# Patient Record
Sex: Male | Born: 1945
Health system: Southern US, Community
[De-identification: ages and names within clinical notes are randomized; demographics above are authoritative.]

## PROBLEM LIST (undated history)

## (undated) DIAGNOSIS — K219 Gastro-esophageal reflux disease without esophagitis: Secondary | ICD-10-CM

## (undated) DIAGNOSIS — I10 Essential (primary) hypertension: Secondary | ICD-10-CM

## (undated) DIAGNOSIS — I639 Cerebral infarction, unspecified: Secondary | ICD-10-CM

## (undated) DIAGNOSIS — E669 Obesity, unspecified: Secondary | ICD-10-CM

## (undated) DIAGNOSIS — K2100 Gastro-esophageal reflux disease with esophagitis, without bleeding: Secondary | ICD-10-CM

## (undated) DIAGNOSIS — J189 Pneumonia, unspecified organism: Secondary | ICD-10-CM

## (undated) DIAGNOSIS — F4310 Post-traumatic stress disorder, unspecified: Secondary | ICD-10-CM

## (undated) DIAGNOSIS — E785 Hyperlipidemia, unspecified: Secondary | ICD-10-CM

## (undated) DIAGNOSIS — C61 Malignant neoplasm of prostate: Secondary | ICD-10-CM

## (undated) DIAGNOSIS — S0990XA Unspecified injury of head, initial encounter: Secondary | ICD-10-CM

## (undated) DIAGNOSIS — D649 Anemia, unspecified: Secondary | ICD-10-CM

## (undated) DIAGNOSIS — N5201 Erectile dysfunction due to arterial insufficiency: Secondary | ICD-10-CM

## (undated) DIAGNOSIS — J869 Pyothorax without fistula: Secondary | ICD-10-CM

## (undated) DIAGNOSIS — G4730 Sleep apnea, unspecified: Secondary | ICD-10-CM

## (undated) DIAGNOSIS — I519 Heart disease, unspecified: Secondary | ICD-10-CM

## (undated) HISTORY — DX: Pyothorax without fistula: J86.9

## (undated) HISTORY — DX: Obesity, unspecified: E66.9

## (undated) HISTORY — PX: TENDON REPAIR: SHX5111

## (undated) HISTORY — PX: VIDEO ASSISTED THORACOSCOPY: SHX5073

## (undated) HISTORY — DX: Cerebral infarction, unspecified: I63.9

## (undated) HISTORY — PX: CARDIAC DEFIBRILLATOR PLACEMENT: SHX171

## (undated) HISTORY — DX: Hyperlipidemia, unspecified: E78.5

## (undated) HISTORY — DX: Heart disease, unspecified: I51.9

## (undated) HISTORY — DX: Anemia, unspecified: D64.9

## (undated) HISTORY — PX: OTHER SURGICAL HISTORY: SHX169

## (undated) HISTORY — DX: Essential (primary) hypertension: I10

## (undated) HISTORY — DX: Post-traumatic stress disorder, unspecified: F43.10

## (undated) HISTORY — DX: Erectile dysfunction due to arterial insufficiency: N52.01

## (undated) HISTORY — DX: Pneumonia, unspecified organism: J18.9

## (undated) HISTORY — DX: Unspecified injury of head, initial encounter: S09.90XA

## (undated) HISTORY — DX: Sleep apnea, unspecified: G47.30

## (undated) HISTORY — DX: Gastro-esophageal reflux disease with esophagitis, without bleeding: K21.00

## (undated) HISTORY — PX: LUNG SURGERY: SHX703

## (undated) HISTORY — PX: ROTATOR CUFF REPAIR: SHX139

---

## 2003-07-23 ENCOUNTER — Ambulatory Visit (HOSPITAL_COMMUNITY): Admission: RE | Admit: 2003-07-23 | Discharge: 2003-07-23 | Payer: Self-pay | Admitting: Pulmonary Disease

## 2003-11-16 ENCOUNTER — Encounter: Payer: Self-pay | Admitting: Emergency Medicine

## 2003-11-16 ENCOUNTER — Inpatient Hospital Stay (HOSPITAL_COMMUNITY): Admission: EM | Admit: 2003-11-16 | Discharge: 2003-11-23 | Payer: Self-pay | Admitting: Cardiology

## 2004-01-22 ENCOUNTER — Ambulatory Visit (HOSPITAL_COMMUNITY): Admission: RE | Admit: 2004-01-22 | Discharge: 2004-01-22 | Payer: Self-pay | Admitting: Pulmonary Disease

## 2004-04-17 ENCOUNTER — Ambulatory Visit: Payer: Self-pay | Admitting: Cardiology

## 2005-03-11 ENCOUNTER — Ambulatory Visit: Payer: Self-pay | Admitting: Internal Medicine

## 2005-03-11 ENCOUNTER — Ambulatory Visit: Payer: Self-pay | Admitting: *Deleted

## 2005-04-21 ENCOUNTER — Ambulatory Visit: Payer: Self-pay | Admitting: *Deleted

## 2005-04-24 ENCOUNTER — Encounter (HOSPITAL_COMMUNITY): Admission: RE | Admit: 2005-04-24 | Discharge: 2005-04-24 | Payer: Self-pay | Admitting: *Deleted

## 2005-04-24 ENCOUNTER — Ambulatory Visit: Payer: Self-pay | Admitting: Cardiology

## 2005-06-10 ENCOUNTER — Ambulatory Visit: Payer: Self-pay | Admitting: Internal Medicine

## 2005-08-27 ENCOUNTER — Ambulatory Visit: Payer: Self-pay | Admitting: Cardiology

## 2005-12-22 ENCOUNTER — Ambulatory Visit: Payer: Self-pay | Admitting: Internal Medicine

## 2006-02-23 ENCOUNTER — Ambulatory Visit: Payer: Self-pay | Admitting: Internal Medicine

## 2006-03-19 ENCOUNTER — Ambulatory Visit: Payer: Self-pay

## 2006-05-06 ENCOUNTER — Ambulatory Visit (HOSPITAL_COMMUNITY): Admission: RE | Admit: 2006-05-06 | Discharge: 2006-05-06 | Payer: Self-pay | Admitting: Pulmonary Disease

## 2006-05-06 ENCOUNTER — Ambulatory Visit: Payer: Self-pay | Admitting: Orthopedic Surgery

## 2006-05-25 ENCOUNTER — Ambulatory Visit: Payer: Self-pay | Admitting: Internal Medicine

## 2006-05-25 ENCOUNTER — Ambulatory Visit: Payer: Self-pay | Admitting: Orthopedic Surgery

## 2006-05-25 ENCOUNTER — Ambulatory Visit (HOSPITAL_COMMUNITY): Admission: RE | Admit: 2006-05-25 | Discharge: 2006-05-25 | Payer: Self-pay | Admitting: Internal Medicine

## 2006-05-27 ENCOUNTER — Ambulatory Visit: Payer: Self-pay | Admitting: Orthopedic Surgery

## 2006-06-01 ENCOUNTER — Encounter (HOSPITAL_COMMUNITY): Admission: RE | Admit: 2006-06-01 | Discharge: 2006-07-01 | Payer: Self-pay | Admitting: Orthopedic Surgery

## 2006-06-07 ENCOUNTER — Ambulatory Visit: Payer: Self-pay | Admitting: Orthopedic Surgery

## 2006-07-06 ENCOUNTER — Ambulatory Visit: Payer: Self-pay | Admitting: Orthopedic Surgery

## 2006-08-02 ENCOUNTER — Ambulatory Visit: Payer: Self-pay | Admitting: Orthopedic Surgery

## 2010-09-10 ENCOUNTER — Other Ambulatory Visit (HOSPITAL_COMMUNITY): Payer: Self-pay | Admitting: Pulmonary Disease

## 2010-09-10 ENCOUNTER — Ambulatory Visit (HOSPITAL_COMMUNITY)
Admission: RE | Admit: 2010-09-10 | Discharge: 2010-09-10 | Disposition: A | Discharge status: Home / Self Care | Payer: Medicare Other | Source: Ambulatory Visit | Attending: Pulmonary Disease | Admitting: Pulmonary Disease

## 2010-09-10 DIAGNOSIS — J189 Pneumonia, unspecified organism: Secondary | ICD-10-CM

## 2010-09-12 ENCOUNTER — Inpatient Hospital Stay (HOSPITAL_COMMUNITY)
Admission: EM | Admit: 2010-09-12 | Discharge: 2010-09-18 | Disposition: A | Discharge status: Home / Self Care | Payer: Non-veteran care | Source: Home / Self Care | Attending: Pulmonary Disease | Admitting: Pulmonary Disease

## 2010-09-12 ENCOUNTER — Emergency Department (HOSPITAL_COMMUNITY): Payer: Non-veteran care

## 2010-09-12 DIAGNOSIS — F4310 Post-traumatic stress disorder, unspecified: Secondary | ICD-10-CM | POA: Diagnosis present

## 2010-09-12 DIAGNOSIS — Z23 Encounter for immunization: Secondary | ICD-10-CM

## 2010-09-12 DIAGNOSIS — D509 Iron deficiency anemia, unspecified: Secondary | ICD-10-CM | POA: Diagnosis present

## 2010-09-12 DIAGNOSIS — J90 Pleural effusion, not elsewhere classified: Secondary | ICD-10-CM | POA: Diagnosis present

## 2010-09-12 DIAGNOSIS — G4733 Obstructive sleep apnea (adult) (pediatric): Secondary | ICD-10-CM | POA: Diagnosis present

## 2010-09-12 DIAGNOSIS — Z95810 Presence of automatic (implantable) cardiac defibrillator: Secondary | ICD-10-CM

## 2010-09-12 DIAGNOSIS — J869 Pyothorax without fistula: Secondary | ICD-10-CM | POA: Diagnosis present

## 2010-09-12 DIAGNOSIS — N189 Chronic kidney disease, unspecified: Secondary | ICD-10-CM | POA: Diagnosis present

## 2010-09-12 DIAGNOSIS — I252 Old myocardial infarction: Secondary | ICD-10-CM

## 2010-09-12 DIAGNOSIS — I129 Hypertensive chronic kidney disease with stage 1 through stage 4 chronic kidney disease, or unspecified chronic kidney disease: Secondary | ICD-10-CM | POA: Diagnosis present

## 2010-09-12 DIAGNOSIS — I2510 Atherosclerotic heart disease of native coronary artery without angina pectoris: Secondary | ICD-10-CM | POA: Diagnosis present

## 2010-09-12 DIAGNOSIS — Z8673 Personal history of transient ischemic attack (TIA), and cerebral infarction without residual deficits: Secondary | ICD-10-CM

## 2010-09-12 DIAGNOSIS — Z8674 Personal history of sudden cardiac arrest: Secondary | ICD-10-CM

## 2010-09-12 DIAGNOSIS — Z87891 Personal history of nicotine dependence: Secondary | ICD-10-CM

## 2010-09-12 DIAGNOSIS — J189 Pneumonia, unspecified organism: Secondary | ICD-10-CM | POA: Diagnosis present

## 2010-09-12 HISTORY — DX: Essential (primary) hypertension: I10

## 2010-09-12 LAB — CBC
Hemoglobin: 9.5 g/dL — ABNORMAL LOW (ref 13.0–17.0)
MCH: 23.3 pg — ABNORMAL LOW (ref 26.0–34.0)
MCHC: 30.4 g/dL (ref 30.0–36.0)
Platelets: 390 10*3/uL (ref 150–400)
RDW: 16.5 % — ABNORMAL HIGH (ref 11.5–15.5)

## 2010-09-12 LAB — DIFFERENTIAL
Basophils Relative: 0 % (ref 0–1)
Eosinophils Absolute: 0.2 10*3/uL (ref 0.0–0.7)
Eosinophils Relative: 1 % (ref 0–5)
Monocytes Absolute: 0.7 10*3/uL (ref 0.1–1.0)
Monocytes Relative: 5 % (ref 3–12)

## 2010-09-12 LAB — BASIC METABOLIC PANEL
CO2: 27 mEq/L (ref 19–32)
Calcium: 8.3 mg/dL — ABNORMAL LOW (ref 8.4–10.5)
Creatinine, Ser: 2.2 mg/dL — ABNORMAL HIGH (ref 0.4–1.5)
GFR calc Af Amer: 37 mL/min — ABNORMAL LOW (ref >60)
GFR calc non Af Amer: 30 mL/min — ABNORMAL LOW (ref >60)

## 2010-09-12 LAB — POCT CARDIAC MARKERS: Myoglobin, poc: 443 ng/mL (ref 12–200)

## 2010-09-13 ENCOUNTER — Inpatient Hospital Stay (HOSPITAL_COMMUNITY): Payer: Non-veteran care

## 2010-09-13 ENCOUNTER — Encounter (HOSPITAL_COMMUNITY): Payer: Self-pay | Admitting: Radiology

## 2010-09-13 LAB — BASIC METABOLIC PANEL
CO2: 28 mEq/L (ref 19–32)
Chloride: 97 mEq/L (ref 96–112)
Creatinine, Ser: 2.28 mg/dL — ABNORMAL HIGH (ref 0.4–1.5)
GFR calc Af Amer: 35 mL/min — ABNORMAL LOW (ref >60)

## 2010-09-13 LAB — DIFFERENTIAL
Basophils Relative: 0 % (ref 0–1)
Lymphocytes Relative: 17 % (ref 12–46)
Monocytes Absolute: 0.9 10*3/uL (ref 0.1–1.0)
Monocytes Relative: 7 % (ref 3–12)
Neutro Abs: 10.5 10*3/uL — ABNORMAL HIGH (ref 1.7–7.7)

## 2010-09-13 LAB — CBC
HCT: 28.6 % — ABNORMAL LOW (ref 39.0–52.0)
Hemoglobin: 8.7 g/dL — ABNORMAL LOW (ref 13.0–17.0)
MCH: 23.5 pg — ABNORMAL LOW (ref 26.0–34.0)
MCHC: 30.4 g/dL (ref 30.0–36.0)

## 2010-09-13 MED ORDER — XENON XE 133 GAS
10.0000 | GAS_FOR_INHALATION | Freq: Once | RESPIRATORY_TRACT | Status: AC | PRN
  Administered 2010-09-13: 32.8 via RESPIRATORY_TRACT

## 2010-09-13 MED ORDER — TECHNETIUM TO 99M ALBUMIN AGGREGATED
5.0000 | Freq: Once | INTRAVENOUS | Status: AC | PRN
  Administered 2010-09-13: 5.12 via INTRAVENOUS

## 2010-09-14 LAB — CBC
HCT: 27.9 % — ABNORMAL LOW (ref 39.0–52.0)
Hemoglobin: 8.4 g/dL — ABNORMAL LOW (ref 13.0–17.0)
MCHC: 30.1 g/dL (ref 30.0–36.0)
WBC: 10.9 10*3/uL — ABNORMAL HIGH (ref 4.0–10.5)

## 2010-09-14 LAB — VITAMIN B12: Vitamin B-12: 616 pg/mL (ref 211–911)

## 2010-09-14 LAB — DIFFERENTIAL
Basophils Absolute: 0 10*3/uL (ref 0.0–0.1)
Basophils Relative: 0 % (ref 0–1)
Lymphocytes Relative: 16 % (ref 12–46)
Monocytes Absolute: 0.9 10*3/uL (ref 0.1–1.0)
Neutro Abs: 8.1 10*3/uL — ABNORMAL HIGH (ref 1.7–7.7)
Neutrophils Relative %: 74 % (ref 43–77)

## 2010-09-14 LAB — BASIC METABOLIC PANEL
CO2: 27 mEq/L (ref 19–32)
Calcium: 8 mg/dL — ABNORMAL LOW (ref 8.4–10.5)
GFR calc Af Amer: 48 mL/min — ABNORMAL LOW (ref >60)
GFR calc non Af Amer: 39 mL/min — ABNORMAL LOW (ref >60)
Glucose, Bld: 110 mg/dL — ABNORMAL HIGH (ref 70–99)
Potassium: 5 mEq/L (ref 3.5–5.1)
Sodium: 131 mEq/L — ABNORMAL LOW (ref 135–145)

## 2010-09-14 LAB — IRON AND TIBC: UIBC: 216 ug/dL

## 2010-09-14 NOTE — Group Therapy Note (Addendum)
  NAMEALDINE, GRAINGER                ACCOUNT NO.:  [id number]  MEDICAL RECORD NO.:  [id number]           PATIENT TYPE:  I  LOCATION:  A202                          FACILITY:  [facility]  PHYSICIAN:  Julia Alkhatib L. Juanetta Gosling, M.D.DATE OF BIRTH:  [birth year]  DATE OF PROCEDURE: DATE OF DISCHARGE:                                PROGRESS NOTE   Mr. Hunsinger was admitted early this morning with pneumonia.  He continues to have some significant right-sided chest pain and it is not clear what that is from.  His exam shows his temperature is 98.2, pulse 69, respirations 16, blood pressure 135/65 and O2 sats 93% on 2 L.  His chest is clearer.  His heart is regular.  His abdomen is soft.  He was noted to have renal failure and it is not clear if it is acute or not because he has been following at the [facility].  His BMET shows BUN is 30, creatinine 2.28.  Sodium 131.  His CBC shows white count is 13,900, hemoglobin is 8.7.  His D-dimer was up and I am going to go ahead and fully anticoagulate him while we are awaiting a ventilation perfusion lung scan.  I do not think that we can get a CT because of his renal function.     Shaunette Gassner L. Juanetta Gosling, M.D.     ELH/MEDQ  D:  09/13/2010  T:  09/14/2010  Job:  [id number]  Electronically Signed by Kari Baars M.D. on 09/14/2010 02:45:20 PM

## 2010-09-14 NOTE — H&P (Addendum)
NAMEESAUL, DORWART                ACCOUNT NO.:  [id number]  MEDICAL RECORD NO.:  [id number]           PATIENT TYPE:  I  LOCATION:  A202                          FACILITY:  [facility]  PHYSICIAN:  Maedell Hedger L. Juanetta Gosling, M.D.DATE OF BIRTH:  [birth year]  DATE OF ADMISSION:  09/12/2010 DATE OF DISCHARGE:  LH                             HISTORY & PHYSICAL   Mr. Cammarata is a 65 year old who had been in his usual state of fair health at home when he developed cough and some chest discomfort about 3- 4 days ago.  When I examined him in my office, he was found to have a temperature of about 102, and he was found to have pneumonia on chest x- ray.  He did fairly well, was apparently still febrile at home, then he developed right-sided chest pain, pneumonia is on the left, and came to the emergency room for evaluation.  He was noted to be in some mild distress because of his right-sided chest pain, had a chest x-ray done that showed that his pneumonia was actually a little bit worse than previously.  He was also found to have an elevated creatinine which is something unknown to him and he gets most of his care at the [facility], so I do not know if that is the situation or not.  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  He has hypertension.  He has posttraumatic stress disorder and he has had a torn biceps tendon.  SOCIAL HISTORY:  Married.  Lives at home.  He does not use alcohol or tobacco.  He has been working as a Associate Professor.  FAMILY HISTORY:  Shows that his mother died of lung cancer.  There is a family history of COPD and there is cardiac disease in the family as well.  REVIEW OF SYSTEMS:  Except as mentioned is essentially negative.  He says he feels better now.  PHYSICAL EXAMINATION:  GENERAL:  Shows a well-developed, well-nourished male who is in no acute distress now. HEENT:   His pupils are reactive.  Nose and throat are clear.  Mucous membranes are moist. NECK:  Supple without masses. CHEST:  Shows rhonchi bilaterally. HEART:  Regular without gallop. ABDOMEN:  Soft. EXTREMITIES:  Showed no edema. CNS:  Grossly intact.  CBC shows white count is 12,200, hemoglobin is 9.5, I am not sure if he has been anemic either.  Platelets 390.  He has 88% neutrophils.  BMET, sodium is low at 129, BUN of 27, creatinine 2.2, again I am unsure about the chronicity of this, but with an anemia, it may be chronic.  Chest x- ray shows pacemaker and some pulmonary congestion and worsening lingular pneumonia.  ASSESSMENT:  He has pneumonia which is worse.  He has some renal failure.  He is going to be admitted to the hospital for IV treatment and then decide what we need to do from there.  I will rehydrate him and see what happens with his renal function.     Brooklin Rieger L. Juanetta Gosling,  M.D.     ELH/MEDQ  D:  09/13/2010  T:  09/13/2010  Job:  [id number]  Electronically Signed by Kari Baars M.D. on 09/14/2010 02:45:16 PM

## 2010-09-14 NOTE — Group Therapy Note (Addendum)
  [name], [name]                ACCOUNT NO.:  [id number]  MEDICAL RECORD NO.:  [id number]           PATIENT TYPE:  I  LOCATION:  A202                          FACILITY:  [facility]  PHYSICIAN:  Shenique Childers L. Juanetta Gosling, M.D.DATE OF BIRTH:  [birth year]  DATE OF PROCEDURE: DATE OF DISCHARGE:                                PROGRESS NOTE   Luis Donovan was admitted with pneumonia.  He had right-sided chest pain. His pneumonia is on the left and he has some renal dysfunction, so I had him do a ventilation perfusion lung scan which does not show evidence of pulmonary embolus.  This morning, he is awake and alert, looks comfortable.  His family says he has been somewhat confused which I think may be related to morphine.  His temperature is 101.8 at 6 o'clock, pulse 91, respirations 20, blood pressure 152/69 and O2 sats 90% on 2 L.  His chest much clearer.  His heart is regular.  His abdomen is soft.  He looks more comfortable and his lab work this morning; his white count is 10,900, hemoglobin is 8.4, MCV on that is 77.3.  He has a anemia profile that has been drawn, but not back yet.  His sodium is 131 and slightly low.  BUN and creatinine are better at 23 and 1.76 respectively and his ventilation perfusion lung scan as mentioned did not show any significant problem.  Renal ultrasound shows that he has some atrophy in one of his kidneys which makes me wonder if he may have a area of renal artery stenosis considering his hypertension, etc., and this will need to be investigated probably once he is better from his pneumonia, I can investigate that as an outpatient.  Otherwise, we will plan to continue with his treatments.  I had put him on full-dose Lovenox until the ventilation perfusion lung scan was back.  I will put that back to the prophylactic dose, continue with everything else, continue with his meds, put him on something for nausea, recheck his hemoglobin level, put him on some Protonix for  complaints of heartburn.     Denai Caba L. Juanetta Gosling, M.D.     ELH/MEDQ  D:  09/14/2010  T:  09/14/2010  Job:  [id number]  Electronically Signed by Kari Baars M.D. on 09/14/2010 02:45:24 PM

## 2010-09-15 ENCOUNTER — Encounter (HOSPITAL_COMMUNITY): Payer: Self-pay | Admitting: Radiology

## 2010-09-15 ENCOUNTER — Inpatient Hospital Stay (HOSPITAL_COMMUNITY): Payer: Non-veteran care

## 2010-09-15 LAB — BASIC METABOLIC PANEL
BUN: 19 mg/dL (ref 6–23)
Calcium: 8.1 mg/dL — ABNORMAL LOW (ref 8.4–10.5)
Creatinine, Ser: 1.58 mg/dL — ABNORMAL HIGH (ref 0.4–1.5)
GFR calc non Af Amer: 45 mL/min — ABNORMAL LOW (ref >60)
Glucose, Bld: 112 mg/dL — ABNORMAL HIGH (ref 70–99)

## 2010-09-15 LAB — CBC
HCT: 27.2 % — ABNORMAL LOW (ref 39.0–52.0)
MCHC: 29.8 g/dL — ABNORMAL LOW (ref 30.0–36.0)
MCV: 77.5 fL — ABNORMAL LOW (ref 78.0–100.0)
Platelets: 423 10*3/uL — ABNORMAL HIGH (ref 150–400)
RDW: 16.9 % — ABNORMAL HIGH (ref 11.5–15.5)

## 2010-09-15 LAB — DIFFERENTIAL
Eosinophils Absolute: 0.2 10*3/uL (ref 0.0–0.7)
Eosinophils Relative: 2 % (ref 0–5)
Lymphocytes Relative: 14 % (ref 12–46)
Lymphs Abs: 2 10*3/uL (ref 0.7–4.0)
Monocytes Absolute: 1.1 10*3/uL — ABNORMAL HIGH (ref 0.1–1.0)

## 2010-09-16 ENCOUNTER — Inpatient Hospital Stay (HOSPITAL_COMMUNITY): Payer: Non-veteran care

## 2010-09-16 DIAGNOSIS — I517 Cardiomegaly: Secondary | ICD-10-CM

## 2010-09-16 LAB — BASIC METABOLIC PANEL
Calcium: 8.7 mg/dL (ref 8.4–10.5)
GFR calc Af Amer: 55 mL/min — ABNORMAL LOW (ref >60)
GFR calc non Af Amer: 45 mL/min — ABNORMAL LOW (ref >60)
Glucose, Bld: 117 mg/dL — ABNORMAL HIGH (ref 70–99)
Sodium: 130 mEq/L — ABNORMAL LOW (ref 135–145)

## 2010-09-16 LAB — CBC
HCT: 28.7 % — ABNORMAL LOW (ref 39.0–52.0)
MCHC: 30.3 g/dL (ref 30.0–36.0)
Platelets: 426 10*3/uL — ABNORMAL HIGH (ref 150–400)
RDW: 16.6 % — ABNORMAL HIGH (ref 11.5–15.5)

## 2010-09-16 LAB — DIFFERENTIAL
Basophils Absolute: 0 10*3/uL (ref 0.0–0.1)
Basophils Relative: 0 % (ref 0–1)
Eosinophils Absolute: 0.2 10*3/uL (ref 0.0–0.7)
Eosinophils Relative: 1 % (ref 0–5)
Monocytes Absolute: 1.4 10*3/uL — ABNORMAL HIGH (ref 0.1–1.0)

## 2010-09-17 ENCOUNTER — Inpatient Hospital Stay (HOSPITAL_COMMUNITY): Payer: Non-veteran care

## 2010-09-18 ENCOUNTER — Inpatient Hospital Stay (HOSPITAL_COMMUNITY): Payer: Non-veteran care

## 2010-09-18 ENCOUNTER — Inpatient Hospital Stay (HOSPITAL_COMMUNITY)
Admission: RE | Admit: 2010-09-18 | Discharge: 2010-09-25 | DRG: 163 | Disposition: A | Discharge status: Home / Self Care | Payer: Non-veteran care | Source: Other Acute Inpatient Hospital | Attending: Cardiothoracic Surgery | Admitting: Cardiothoracic Surgery

## 2010-09-18 DIAGNOSIS — J869 Pyothorax without fistula: Secondary | ICD-10-CM

## 2010-09-18 LAB — BASIC METABOLIC PANEL
BUN: 15 mg/dL (ref 6–23)
CO2: 33 mEq/L — ABNORMAL HIGH (ref 19–32)
Calcium: 9.6 mg/dL (ref 8.4–10.5)
Chloride: 94 mEq/L — ABNORMAL LOW (ref 96–112)
Creatinine, Ser: 1.22 mg/dL (ref 0.4–1.5)
GFR calc Af Amer: >60 mL/min (ref >60)
GFR calc non Af Amer: >60 mL/min (ref >60)
Glucose, Bld: 125 mg/dL — ABNORMAL HIGH (ref 70–99)
Potassium: 5.6 mEq/L — ABNORMAL HIGH (ref 3.5–5.1)
Sodium: 134 mEq/L — ABNORMAL LOW (ref 135–145)

## 2010-09-18 LAB — CBC
HCT: 30 % — ABNORMAL LOW (ref 39.0–52.0)
Hemoglobin: 9 g/dL — ABNORMAL LOW (ref 13.0–17.0)
MCH: 23 pg — ABNORMAL LOW (ref 26.0–34.0)
MCHC: 30 g/dL (ref 30.0–36.0)
MCV: 76.5 fL — ABNORMAL LOW (ref 78.0–100.0)
Platelets: 455 10*3/uL — ABNORMAL HIGH (ref 150–400)
RBC: 3.92 MIL/uL — ABNORMAL LOW (ref 4.22–5.81)
RDW: 16.7 % — ABNORMAL HIGH (ref 11.5–15.5)
WBC: 20 10*3/uL — ABNORMAL HIGH (ref 4.0–10.5)

## 2010-09-18 LAB — APTT: aPTT: 33 seconds (ref 24–37)

## 2010-09-18 LAB — URINE MICROSCOPIC-ADD ON

## 2010-09-18 LAB — URINALYSIS, ROUTINE W REFLEX MICROSCOPIC
Bilirubin Urine: NEGATIVE
Glucose, UA: NEGATIVE mg/dL
Ketones, ur: NEGATIVE mg/dL
Leukocytes, UA: NEGATIVE
Nitrite: NEGATIVE
Protein, ur: 30 mg/dL — AB
Specific Gravity, Urine: 1.02 (ref 1.005–1.030)
Urobilinogen, UA: 1 mg/dL (ref 0.0–1.0)
pH: 5.5 (ref 5.0–8.0)

## 2010-09-18 LAB — BLOOD GAS, ARTERIAL
Acid-Base Excess: 5 mmol/L — ABNORMAL HIGH (ref 0.0–2.0)
Bicarbonate: 29 mEq/L — ABNORMAL HIGH (ref 20.0–24.0)
Drawn by: 24513
FIO2: 21 %
O2 Saturation: 79.8 %
Patient temperature: 98.6
TCO2: 30.4 mmol/L (ref 0–100)
pCO2 arterial: 43 mmHg (ref 35.0–45.0)
pH, Arterial: 7.444 (ref 7.350–7.450)
pO2, Arterial: 45.3 mmHg — ABNORMAL LOW (ref 80.0–100.0)

## 2010-09-18 LAB — PROTIME-INR
INR: 1.17 (ref 0.00–1.49)
Prothrombin Time: 15.1 seconds (ref 11.6–15.2)

## 2010-09-18 LAB — ABO/RH: ABO/RH(D): A POS

## 2010-09-18 LAB — HEMOGLOBIN A1C
Hgb A1c MFr Bld: 6.2 % — ABNORMAL HIGH (ref <5.7)
Mean Plasma Glucose: 131 mg/dL — ABNORMAL HIGH (ref <117)

## 2010-09-18 LAB — MRSA PCR SCREENING: MRSA by PCR: NEGATIVE

## 2010-09-19 ENCOUNTER — Inpatient Hospital Stay (HOSPITAL_COMMUNITY): Payer: Non-veteran care

## 2010-09-19 ENCOUNTER — Other Ambulatory Visit: Payer: Self-pay | Admitting: Cardiothoracic Surgery

## 2010-09-19 DIAGNOSIS — J869 Pyothorax without fistula: Secondary | ICD-10-CM

## 2010-09-19 LAB — BLOOD GAS, ARTERIAL
Acid-Base Excess: 3.4 mmol/L — ABNORMAL HIGH (ref 0.0–2.0)
Acid-Base Excess: 3.7 mmol/L — ABNORMAL HIGH (ref 0.0–2.0)
Bicarbonate: 28.9 mEq/L — ABNORMAL HIGH (ref 20.0–24.0)
Bicarbonate: 28.6 mEq/L — ABNORMAL HIGH (ref 20.0–24.0)
Drawn by: 22251
Drawn by: 22251
Expiratory PAP: 6
Expiratory PAP: 6
FIO2: 0.5 %
FIO2: 0.5 %
Inspiratory PAP: 6
Inspiratory PAP: 12
Mode: POSITIVE
Mode: POSITIVE
O2 Saturation: 91.3 %
O2 Saturation: 93.3 %
Patient temperature: 98.6
Patient temperature: 98.6
TCO2: 30.6 mmol/L (ref 0–100)
TCO2: 30.2 mmol/L (ref 0–100)
pCO2 arterial: 56.3 mmHg — ABNORMAL HIGH (ref 35.0–45.0)
pCO2 arterial: 51 mmHg — ABNORMAL HIGH (ref 35.0–45.0)
pH, Arterial: 7.33 — ABNORMAL LOW (ref 7.350–7.450)
pH, Arterial: 7.368 (ref 7.350–7.450)
pO2, Arterial: 71.2 mmHg — ABNORMAL LOW (ref 80.0–100.0)
pO2, Arterial: 74.1 mmHg — ABNORMAL LOW (ref 80.0–100.0)

## 2010-09-19 LAB — CBC
HCT: 29.9 % — ABNORMAL LOW (ref 39.0–52.0)
Hemoglobin: 9 g/dL — ABNORMAL LOW (ref 13.0–17.0)
MCH: 22.7 pg — ABNORMAL LOW (ref 26.0–34.0)
MCHC: 30.1 g/dL (ref 30.0–36.0)
MCV: 75.5 fL — ABNORMAL LOW (ref 78.0–100.0)
Platelets: 456 10*3/uL — ABNORMAL HIGH (ref 150–400)
RBC: 3.96 MIL/uL — ABNORMAL LOW (ref 4.22–5.81)
RDW: 16.6 % — ABNORMAL HIGH (ref 11.5–15.5)
WBC: 29.6 10*3/uL — ABNORMAL HIGH (ref 4.0–10.5)

## 2010-09-19 LAB — GLUCOSE, CAPILLARY
Glucose-Capillary: 113 mg/dL — ABNORMAL HIGH (ref 70–99)
Glucose-Capillary: 116 mg/dL — ABNORMAL HIGH (ref 70–99)

## 2010-09-19 NOTE — Consult Note (Signed)
Luis Donovan, Luis Donovan                ACCOUNT NO.:  [id number]  MEDICAL RECORD NO.:  [id number]           PATIENT TYPE:  I  LOCATION:  2032                         FACILITY:  [facility]  PHYSICIAN:  Kerin Perna, M.D.  DATE OF BIRTH:  1946/02/12  DATE OF CONSULTATION:  09/18/2010 DATE OF DISCHARGE:                                CONSULTATION   REASON FOR CONSULTATION: 1. Right lower lobe pneumonia and right empyema. 2. Bibasilar lung densities.  HISTORY OF PRESENT ILLNESS:  I was asked to evaluate this 65 year old obese African American, nondiabetic, nonsmoking male for surgical therapy for recently diagnosed right empyema.  The patient was admitted to [facility] on September 13, 2010, with a preceding history of several days of nonproductive cough, low-grade fever, malaise, and a chest x-ray showing infiltrate in the right base.  He was placed on oral antibiotics, but within 48 hours developed right back pain and was admitted to the hospital.  A CT scan at that time showed a moderate pleural effusion, which was new and attempted thoracentesis was unsuccessful in removing any fluid.  He is placed on IV antibiotics (Zithromax and Rocephin) and had no clinical improvement.  For that reason, he was transferred to [facility] for thoracic surgical evaluation and treatment of a right basilar pneumonia with a complication of right empyema.  The CT scan demonstrated also bibasilar densities, which could be due to atelectasis.  There are no highly suspicious mediastinal nodes noted.  The patient has had mild weight loss, but was actually feeling fairly well 1-2 weeks prior to this illness.  PAST MEDICAL HISTORY: 1. Hypertension. 2. Chronic renal insufficiency (creatinine was 2.2 on admission; now     it is 1.56). 3. 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 4. No known drug allergies. 5.  Obstructive sleep apnea. 6. Obesity. 7. 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 8. Chronic anemia of unknown etiology. 9. Posttraumatic stress disorder.  SOCIAL HISTORY:  The patient is married with adult children.  He works as a Archivist.  He denies alcohol or tobacco use.  He also makes candles.  FAMILY HISTORY:  Positive for lung cancer in his mother, positive for COPD, and positive for coronary artery disease.  HOME MEDICATIONS: 1. Norvasc 10 mg daily. 2. Toprol-XL 50 mg daily. 3. Protonix 40 mg daily. 4. Crestor 20 mg daily.  ALLERGIES:  None.  REVIEW OF SYSTEMS:  CONSTITUTIONAL:  Review is positive fever and perhaps mild weight loss recently.  No night sweats.  ENT:  Review is positive for previous visual loss from a remote CVA which is mild.  He denies any active dental complaints or difficulty swallowing.  THORACIC: Review is negative for history of previous thoracic trauma, previous pneumonia, pneumothorax, or hemoptysis.  CARDIAC:  Review is positive for his known coronary disease, status post MI, VFib arrest in the AICU was implanted in 2005.  His EF by echo on May 1 was 50%.  He had no significant valve disease and he had no significant pericardial effusion.  GI:  Review is negative for hepatitis, jaundice, or blood per rectum.  UROLOGIC:  Review is negative for BPH, kidney stones, or hematuria.  ENDOCRINE:  Review is negative diabetes or thyroid disease. VASCULAR:  Review is negative for DVT, claudication, or TIA. NEUROLOGIC:  Review is positive for stroke.  Negative for seizure.  He is right-hand dominant.  PHYSICAL EXAMINATION:  VITAL SIGNS:  The patient is 5 feet 8 inches, and weighs 117 kg, blood pressure is 180/90, pulse 100, temperature 97.8, saturation 94% on 4 L nasal cannula. GENERAL APPEARANCE:  That of a middle-aged male in no acute distress, surrounded by wife and family. HEENT:  Normocephalic.  Pupils are  equal. NECK:  Without JVD, mass or palpable adenopathy. LUNGS:  Breath sounds are with bilateral wheezing.  Breath sounds are diminished at the right base.  He has tenderness over his right posterior flank and two puncture sites from an attempted thoracentesis x2 which were unsuccessful to remove any fluid. CARDIAC:  Regular rhythm.  Tachycardic.  Without S3 gallop or murmur. ABDOMEN:  Obese, soft, and nontender. EXTREMITIES:  No clubbing, cyanosis, tenderness, or edema.  Peripheral pulses are intact in the extremities. NEUROLOGIC:  Alert and oriented without focal motor deficits.  LABORATORY DATA:  I have reviewed his CT scan and his laboratory values. He is anemic with hematocrit 28.  His creatinine is 2.2 and his white count is 14,000.  IMPRESSION AND PLAN:  The patient has evidence of right basilar pneumonia complicated by right empyema with clinical improvement on IV antibiotics.  Right video-assisted thoracic surgery and decortication would be the recommendation; and we will obtain material for culture as well as cytology and pathology.  He understands the indications, benefits, alternatives, and risks of right video-assisted thoracic surgery decortication.  The risks include potential stroke, further renal failure, ventilator-dependence, bleeding, blood transfusion requirement, myocardial infarction, and death.  He agrees to proceed with surgery in the morning.     Kerin Perna, M.D.     PV/MEDQ  D:  09/18/2010  T:  09/19/2010  Job:  [id number]  cc:   Ramon Dredge L. Juanetta Gosling, M.D.  Electronically Signed by Kerin Perna M.D. on 09/19/2010 03:16:29 PM

## 2010-09-20 ENCOUNTER — Inpatient Hospital Stay (HOSPITAL_COMMUNITY): Payer: Non-veteran care

## 2010-09-20 DIAGNOSIS — E1165 Type 2 diabetes mellitus with hyperglycemia: Secondary | ICD-10-CM

## 2010-09-20 DIAGNOSIS — IMO0001 Reserved for inherently not codable concepts without codable children: Secondary | ICD-10-CM

## 2010-09-20 LAB — BLOOD GAS, ARTERIAL
Acid-Base Excess: 5.6 mmol/L — ABNORMAL HIGH (ref 0.0–2.0)
Bicarbonate: 30.1 mEq/L — ABNORMAL HIGH (ref 20.0–24.0)
FIO2: 0.3 %
O2 Saturation: 96.6 %
Patient temperature: 98.6
TCO2: 31.6 mmol/L (ref 0–100)
pCO2 arterial: 48.2 mmHg — ABNORMAL HIGH (ref 35.0–45.0)
pH, Arterial: 7.412 (ref 7.350–7.450)
pO2, Arterial: 84 mmHg (ref 80.0–100.0)

## 2010-09-20 LAB — CBC
HCT: 27.4 % — ABNORMAL LOW (ref 39.0–52.0)
Hemoglobin: 8.3 g/dL — ABNORMAL LOW (ref 13.0–17.0)
MCH: 22.9 pg — ABNORMAL LOW (ref 26.0–34.0)
MCHC: 30.3 g/dL (ref 30.0–36.0)
MCV: 75.5 fL — ABNORMAL LOW (ref 78.0–100.0)
Platelets: 344 10*3/uL (ref 150–400)
RBC: 3.63 MIL/uL — ABNORMAL LOW (ref 4.22–5.81)
RDW: 16.4 % — ABNORMAL HIGH (ref 11.5–15.5)
WBC: 23.4 10*3/uL — ABNORMAL HIGH (ref 4.0–10.5)

## 2010-09-20 LAB — GLUCOSE, CAPILLARY
Glucose-Capillary: 141 mg/dL — ABNORMAL HIGH (ref 70–99)
Glucose-Capillary: 93 mg/dL (ref 70–99)
Glucose-Capillary: 119 mg/dL — ABNORMAL HIGH (ref 70–99)
Glucose-Capillary: 90 mg/dL (ref 70–99)

## 2010-09-20 LAB — BASIC METABOLIC PANEL
BUN: 15 mg/dL (ref 6–23)
CO2: 33 mEq/L — ABNORMAL HIGH (ref 19–32)
Calcium: 8.7 mg/dL (ref 8.4–10.5)
Chloride: 94 mEq/L — ABNORMAL LOW (ref 96–112)
Creatinine, Ser: 1.19 mg/dL (ref 0.4–1.5)
GFR calc Af Amer: >60 mL/min (ref >60)
GFR calc non Af Amer: >60 mL/min (ref >60)
Glucose, Bld: 110 mg/dL — ABNORMAL HIGH (ref 70–99)
Potassium: 4.8 mEq/L (ref 3.5–5.1)
Sodium: 133 mEq/L — ABNORMAL LOW (ref 135–145)

## 2010-09-21 ENCOUNTER — Inpatient Hospital Stay (HOSPITAL_COMMUNITY): Payer: Non-veteran care

## 2010-09-21 LAB — CROSSMATCH
ABO/RH(D): A POS
Antibody Screen: NEGATIVE
Unit division: 0
Unit division: 0

## 2010-09-21 LAB — CBC
HCT: 25.6 % — ABNORMAL LOW (ref 39.0–52.0)
Hemoglobin: 7.9 g/dL — ABNORMAL LOW (ref 13.0–17.0)
MCH: 23.4 pg — ABNORMAL LOW (ref 26.0–34.0)
MCHC: 30.9 g/dL (ref 30.0–36.0)
MCV: 75.7 fL — ABNORMAL LOW (ref 78.0–100.0)
Platelets: 329 10*3/uL (ref 150–400)
RBC: 3.38 MIL/uL — ABNORMAL LOW (ref 4.22–5.81)
RDW: 16.7 % — ABNORMAL HIGH (ref 11.5–15.5)
WBC: 20.2 10*3/uL — ABNORMAL HIGH (ref 4.0–10.5)

## 2010-09-21 LAB — COMPREHENSIVE METABOLIC PANEL
ALT: 30 U/L (ref 0–53)
AST: 31 U/L (ref 0–37)
Albumin: 2.1 g/dL — ABNORMAL LOW (ref 3.5–5.2)
Alkaline Phosphatase: 91 U/L (ref 39–117)
BUN: 16 mg/dL (ref 6–23)
CO2: 33 mEq/L — ABNORMAL HIGH (ref 19–32)
Calcium: 8.6 mg/dL (ref 8.4–10.5)
Chloride: 92 mEq/L — ABNORMAL LOW (ref 96–112)
Creatinine, Ser: 1.4 mg/dL (ref 0.4–1.5)
GFR calc Af Amer: >60 mL/min (ref >60)
GFR calc non Af Amer: 51 mL/min — ABNORMAL LOW (ref >60)
Glucose, Bld: 102 mg/dL — ABNORMAL HIGH (ref 70–99)
Potassium: 4.1 mEq/L (ref 3.5–5.1)
Sodium: 131 mEq/L — ABNORMAL LOW (ref 135–145)
Total Bilirubin: 0.2 mg/dL — ABNORMAL LOW (ref 0.3–1.2)
Total Protein: 5.7 g/dL — ABNORMAL LOW (ref 6.0–8.3)

## 2010-09-21 LAB — GLUCOSE, CAPILLARY
Glucose-Capillary: 124 mg/dL — ABNORMAL HIGH (ref 70–99)
Glucose-Capillary: 148 mg/dL — ABNORMAL HIGH (ref 70–99)

## 2010-09-21 LAB — POCT I-STAT 3, ART BLOOD GAS (G3+)
Bicarbonate: 26.7 mEq/L — ABNORMAL HIGH (ref 20.0–24.0)
O2 Saturation: 89 %
TCO2: 28 mmol/L (ref 0–100)
pCO2 arterial: 51.7 mmHg — ABNORMAL HIGH (ref 35.0–45.0)
pH, Arterial: 7.321 — ABNORMAL LOW (ref 7.350–7.450)
pO2, Arterial: 62 mmHg — ABNORMAL LOW (ref 80.0–100.0)

## 2010-09-21 LAB — VANCOMYCIN, TROUGH: Vancomycin Tr: 20.7 ug/mL — ABNORMAL HIGH (ref 10.0–20.0)

## 2010-09-22 ENCOUNTER — Inpatient Hospital Stay (HOSPITAL_COMMUNITY): Payer: Non-veteran care

## 2010-09-22 LAB — CBC
HCT: 25.9 % — ABNORMAL LOW (ref 39.0–52.0)
Hemoglobin: 7.8 g/dL — ABNORMAL LOW (ref 13.0–17.0)
MCH: 22.6 pg — ABNORMAL LOW (ref 26.0–34.0)
MCHC: 30.1 g/dL (ref 30.0–36.0)
MCV: 75.1 fL — ABNORMAL LOW (ref 78.0–100.0)
Platelets: 338 10*3/uL (ref 150–400)
RBC: 3.45 MIL/uL — ABNORMAL LOW (ref 4.22–5.81)
RDW: 16.7 % — ABNORMAL HIGH (ref 11.5–15.5)
WBC: 15.9 10*3/uL — ABNORMAL HIGH (ref 4.0–10.5)

## 2010-09-22 LAB — BODY FLUID CULTURE
Culture: NO GROWTH
Culture: NO GROWTH
Gram Stain: NONE SEEN

## 2010-09-22 LAB — BASIC METABOLIC PANEL
BUN: 14 mg/dL (ref 6–23)
CO2: 32 mEq/L (ref 19–32)
Calcium: 8.9 mg/dL (ref 8.4–10.5)
Chloride: 95 mEq/L — ABNORMAL LOW (ref 96–112)
Creatinine, Ser: 1.34 mg/dL (ref 0.4–1.5)
GFR calc Af Amer: >60 mL/min (ref >60)
GFR calc non Af Amer: 54 mL/min — ABNORMAL LOW (ref >60)
Glucose, Bld: 127 mg/dL — ABNORMAL HIGH (ref 70–99)
Potassium: 4 mEq/L (ref 3.5–5.1)
Sodium: 135 mEq/L (ref 135–145)

## 2010-09-22 LAB — GLUCOSE, CAPILLARY: Glucose-Capillary: 114 mg/dL — ABNORMAL HIGH (ref 70–99)

## 2010-09-23 ENCOUNTER — Inpatient Hospital Stay (HOSPITAL_COMMUNITY): Payer: Non-veteran care

## 2010-09-24 ENCOUNTER — Inpatient Hospital Stay (HOSPITAL_COMMUNITY): Payer: Non-veteran care

## 2010-09-24 LAB — ANAEROBIC CULTURE: Gram Stain: NONE SEEN

## 2010-09-24 NOTE — Group Therapy Note (Addendum)
  NAMEJOAKIM, Luis Donovan                ACCOUNT NO.:  [id number]  MEDICAL RECORD NO.:  [id number]           PATIENT TYPE:  LOCATION:                                 FACILITY:  PHYSICIAN:  Dalyn Becker L. Juanetta Gosling, M.D.DATE OF BIRTH:  07-26-1945  DATE OF PROCEDURE: DATE OF DISCHARGE:                                PROGRESS NOTE   [name] continues to have complaints of severe right-sided pain.  His pneumonia is on the left.  He also has very low iron levels and is anemic which he did not know about.  His renal function has improved some.  He says he is more short of breath.  EXAMINATION:  VITAL SIGNS:  His temperature is 98.4; pulse 94; respirations 16; blood pressure 164/85; O2 sats 96% on 2 liters, then later 93% on 4 liters. CHEST:  Decreased breath sounds.  He is splinting the right hemidiaphragm.  His CBC shows his hemoglobin is 8.1, compared to 8.4 yesterday.  BMET shows his sodium is 129, potassium 5.3, BUN of 19, creatinine is down to 1.58, I think all that is better.  His potassium is up slightly.  ASSESSMENT:  He is anemic, that may be causing multifactorial shortness of breath.  His iron level is very low, I am going to check stools for blood.  I am going to go ahead and have him get a CT of the chest without contrast because it is just not clear why he is having so much pain in his right side.  I have encouraged him to use the incentive spirometer.  I would typically add some anti-inflammatory but with his anemia, especially iron deficiency, I am concerned that this may be related to perhaps some GI bleeding and wait and see what the results of all of the above are.     Tena Linebaugh L. Juanetta Gosling, M.D.     ELH/MEDQ  D:  09/15/2010  T:  09/15/2010  Job:  [id number]  Electronically Signed by Kari Baars M.D. on 09/24/2010 12:43:40 PM

## 2010-09-24 NOTE — Group Therapy Note (Addendum)
  NAMEKAENAN, JAKE                ACCOUNT NO.:  [id number]  MEDICAL RECORD NO.:  [id number]           PATIENT TYPE:  I  LOCATION:                                 FACILITY:  PHYSICIAN:  Lynard Postlewait L. Juanetta Gosling, M.D.DATE OF BIRTH:  12/19/1945  DATE OF PROCEDURE:  09/18/2010 DATE OF DISCHARGE:                                PROGRESS NOTE   Mr. Varady is a 65 year old African American male who was seen in my office about a week prior to admission and was found to have a left lower lobe pneumonia.  He was complaining of right-sided chest pain.  He had been sick for several days.  He was treated with Avelox, but did not seem to improve a great deal, continued to have fever, then developed more right-sided chest pain, came to the emergency room, was found to have worsened pneumonia, fever, and was admitted.  He has had workup here in the hospital.  He was found to have some worsening of his mild chronic renal failure and he was treated for pneumonia.  His antibiotics were changed but he continued to have problems.  He underwent a CT without contrast that showed that he had a right lung effusion.  I attempted to get a thoracentesis but fluid was not able to be obtained because it appeared to be loculated and it looked like a complicated effusion on sonogram.  In addition to all that, he was found to be anemic with which appears to be iron deficiency.  So far, his stools are negative.  He had a renal ultrasound that shows that he has some decrease in size in one of his kidneys.  After the attempted thoracentesis, I discussed the situation with Dr. Donata Clay who agrees to accept him in transfer for probable VATS procedure.  After he is improved and discharged, he is going to need workup of his anemia and workup of the change in his kidney size, but all that can be accomplished as an outpatient.  He is being transferred to [facility] to the service of Dr. Donata Clay for presumed VATS  procedure.     Mossie Gilder L. Juanetta Gosling, M.D.     ELH/MEDQ  D:  09/18/2010  T:  09/18/2010  Job:  [id number]  Electronically Signed by Kari Baars M.D. on 09/24/2010 12:43:47 PM

## 2010-09-24 NOTE — Group Therapy Note (Addendum)
  NAMEEDIEL, UNANGST                ACCOUNT NO.:  [id number]  MEDICAL RECORD NO.:  [id number]           PATIENT TYPE:  LOCATION:                                 FACILITY:  PHYSICIAN:  Rashida Ladouceur L. Juanetta Gosling, M.D.DATE OF BIRTH:  06-26-1945  DATE OF PROCEDURE: DATE OF DISCHARGE:                                PROGRESS NOTE   Mr. Brouillard says he is feeling better.  He is admitted with pneumonia, also found to have anemia, increased renal dysfunction.  He had a CT of the chest yesterday that showed he has a right pleural effusion and some right lung infiltrate.  This morning, he says he feels better.  He is less short of breath having less pain.  His temperature is 100, pulse 108, respirations 24, blood pressure 162/82, O2 sats 92% on 3 liters. His chest is much clear and he is moving air better.  His heart is regular.  His abdomen is soft.  Extremities showed no edema.  ASSESSMENT:  He is overall improved.  PLAN:  He is going to have a thoracentesis today.  I have ordered CBC and BMET today.  Continue with his antibiotics.  I am going to give him another dose of Lasix and then follow from there.     Dermot Gremillion L. Juanetta Gosling, M.D.     ELH/MEDQ  D:  09/16/2010  T:  09/16/2010  Job:  [id number]  Electronically Signed by Kari Baars M.D. on 09/24/2010 12:43:44 PM

## 2010-09-29 NOTE — Op Note (Signed)
Luis Donovan, Luis Donovan                ACCOUNT NO.:  [id number]  MEDICAL RECORD NO.:  [id number]           PATIENT TYPE:  I  LOCATION:  2313                         FACILITY:  [facility]  PHYSICIAN:  Kerin Perna, M.D.  DATE OF BIRTH:  Oct 31, 1945  DATE OF PROCEDURE:  09/19/2010 DATE OF DISCHARGE:                              OPERATIVE REPORT   OPERATIONS: 1. Right VATS (video-assisted thoracoscopic surgery) with     minithoracotomy and decortication of empyema involving the right     lower lobe. 2. Placement of wound On-Q analgesic irrigation system.  SURGEON:  Kerin Perna, MD  ASSISTANT:  Coral Ceo, PA-C  PREOPERATIVE DIAGNOSIS:  Right lower lobe pneumonia with empyema.  POSTOPERATIVE DIAGNOSIS:  Right lower lobe pneumonia with empyema.  INDICATIONS:  The patient is a 65 year old obese, nondiabetic, reformed smoker who was treated with antibiotics for a right lower lobe pneumonia, but progressed to having chest pain and a significant pleural effusion, which was loculated and not drained with thoracentesis.  He was transferred from [facility] to [facility] for thoracic surgical evaluation, treatment of a right empyema which was documented by chest CT scan.  I discussed the procedure of right VATS decortication for treatment of the empyema with the patient and his family and they understood and agreed.  They understood the issues with postoperative pain, the potential for postoperative respiratory problems including the ventilator to support, bleeding, blood transfusion requirement, further pneumonia, and death.  He demonstrated his understanding of these issues and agreed to proceed with surgery under what I felt was an informed consent.  PROCEDURE:  The patient was brought to the operating room, placed supine on the operating room table where general anesthesia was induced.  A double-lumen endotracheal tube was positioned by the Anesthesiology Team and the patient  was turned to expose the right chest.  A proper time-out was performed to confirm proper patient, proper site and proper procedure.  The right chest was then prepped and draped as a sterile field.  A small VATS portal incision was made in the fifth interspace anterior to the tip of the scapula.  The scope was inserted, but there was significant fluid in adhesions and poor visualization.  The incision was then extended to approximately 6-8 cm and the ribs gently retracted. There was an attempts of pleural reaction with loculated fluid, glue- like material as well as a thick fibrous peel on the diaphragm and covering the right lower lobe and extending to the posterior segment of the right upper lobe.  This was carefully dissected and the material on the visceral pleura removed as well as the material on the diaphragm. There was no evidence of tumor, but the material was sent to pathology and the fluid was sent for cytology.  Also, cultures were submitted of fluid and tissue.  The lung was entirely freed of all loculated fluid. The right lung was completely freed of the entrapped visceral peel.  The lungs were irrigated with warm saline.  Two mediastinal chest tubes were placed.  The anterior and posterior tubes were placed to drain the anterior and posterior  right pleural space and brought out through separate incisions and secured to the skin.  The lung was then inflated under direct vision and fill the hemithorax.  The ribs were reapproximated with interrupted #2 Vicryl.  The muscle layers were closed with interrupted #1 Vicryl.  The subcutaneous and skin layers were closed in running Vicryl and sterile dressings were applied.  An On-Q catheter was placed beneath the main incision in the chest tubes secured to the skin and connected to 0.5% Marcaine reservoir.  The patient was extubated in the OR and returned to recovery room.     Kerin Perna, M.D.     PV/MEDQ  D:  09/19/2010   T:  09/19/2010  Job:  [id number]  cc:   Ramon Dredge L. Juanetta Gosling, M.D.  Electronically Signed by Kerin Perna M.D. on 09/29/2010 10:25:37 AM

## 2010-09-29 NOTE — Discharge Summary (Signed)
NAMEOSCEOLA, DEPAZ                ACCOUNT NO.:  [id number]  MEDICAL RECORD NO.:  [id number]           PATIENT TYPE:  I  LOCATION:  2031                         FACILITY:  [facility]  PHYSICIAN:  Kerin Perna, M.D.  DATE OF BIRTH:  1946/04/18  DATE OF ADMISSION:  09/18/2010 DATE OF DISCHARGE:  09/25/2010                              DISCHARGE SUMMARY   ADMITTING DIAGNOSES: 1. Right lower lobe pneumonia and right empyema. 2. Bibasilar lung densities. 3. History of hypertension. 4. History of chronic renal insufficiency (creatinine 2.2 upon     admission). 5. History of myocardial infarction with ventricular fibrillation     arrest in 2005 (status post percutaneous coronary intervention with     stent to the circumflex; and status post automatic implantable     cardioverter-defibrillator). 6. History of obstructive sleep apnea. 7. History of obesity. 8. History of remote cerebrovascular accident. 9. History of chronic anemia. 10.History of posttraumatic stress disorder.  DISCHARGE DIAGNOSES: 1. Right lower lobe pneumonia and right empyema. 2. Bibasilar lung densities. 3. History of hypertension. 4. History of chronic renal insufficiency (creatinine 2.2 upon     admission). 5. History of myocardial infarction with ventricular fibrillation     arrest in 2005 (status post percutaneous coronary intervention with     stent to the circumflex; and status post automatic implantable     cardioverter-defibrillator). 6. History of obstructive sleep apnea. 7. History of obesity. 8. History of remote cerebrovascular accident. 9. History of chronic anemia. 10.History of posttraumatic stress disorder.  PROCEDURE:  Right video-assisted thoracoscopic surgery, right mini thoracotomy, decortication and drainage of empyema with placement of On- Q by Dr. Donata Clay on Sep 19, 2010.  PATHOLOGY RESULTS:  Right pleural biopsy showed inflamed granulation tissue with fibrosis, no evidence of  malignancy and pleural fluid cytology showed no malignant cells.  HISTORY OF PRESENT ILLNESS:  This is a 65 year old Caucasian male with the aforementioned past medical history who was recently diagnosed with right lower lobe pneumonia.  The patient was initially admitted to [facility] on September 13, 2010, with symptoms that included several days of nonproductive cough, low-grade fever, and malaise.  A chest x- ray that was done showed an infiltrate at the right base.  He initially was placed on oral antibiotics.  A CAT scan of the chest that was done on September 15, 2010, showed a moderate-sized right pleural effusion, compressive atelectasis of the right lung disease, bibasilar densities of the left lower lobe and right middle lobe (possible prominent atelectasis versus infiltrate), scattered air bronchograms of the right lower lobe, calcified granuloma of the right lower lobe.  According to medical records, a right thoracentesis was attempted but was unsuccessful.  The patient was then placed on IV antibiotics (Zithromax and Rocephin) but had no clinical improvement.  He was then transferred to [facility] for thoracic surgical evaluation and treatment regarding the right lower lobe pneumonia with complication of right empyema.  The patient was seen and evaluated by Dr. Donata Clay on the day of transfer on Sep 18, 2010.  CAT scan of the chest previously done was  reviewed. In addition, because of the patient's previous cardiac history,  an echocardiogram that had been done on Sep 16, 2010, was also reviewed. Thus, we decided the patient needed to undergo a right VATS, right mini thoracotomy, drainage of empyema, and decortication.  Potential risks, complications, and benefits of the surgery were discussed with the patient, he agreed to proceed.  The surgery was performed on Sep 19, 2010, by Dr. Donata Clay.  BRIEF HOSPITAL COURSE STAY:  The patient was extubated after  surgery without difficulty.  He remained afebrile and hemodynamically stable. He had minimal CT drainage and no air leak.  He was continued on vancomycin and Zosyn IV for several days postoperatively.  Per Dr. Donata Clay, however, the patient will be discharged home on Augmentin p.o. The patient's chest tube was placed to water seal on Sep 21, 2010, and on this date, he was felt surgically stable for transfer from the ICU to 3300.  His chest x-ray remained stable and again his chest tube had no air leak, it was removed on Sep 22, 2010, along with his On-Q and PCA. He was then felt surgically stable for transfer to 2000 for further convalescence.  Currently on postoperative day #5, he has already been tolerating a diet.  He is afebrile, his vital signs are stable.  His O2 sat is 90% on 1 liter via nasal cannula.  On physical examination, on tele, sinus rhythm, sinus tach.  Lungs are clear.  His right chest incision is clean, dry, and continuing to heal.  Provided the patient remains afebrile, hemodynamically stable,  his chest x-ray remains stable, and he is weaned off oxygen, he will be surgically stable for discharge on Sep 25, 2010.  Latest laboratory studies are as follows:  Final culture of the right pleural fluid showed no white blood cells, no organisms, no growth.  BMET done on Sep 22, 2010, potassium 4,  sodium 135, BUN and creatinine 14 and 1.34. CBC on this date, H and H of 7.8 and 25.9, white count down to 15,900, platelet count 338,000.  Last chest x-ray done on Sep 24, 2010, showed a stable 12-mm indeterminate nodule in the left mid lung.  No pneumothorax.  Bronchial wall thickening suggestive of chronic bronchitis.  DISCHARGE INSTRUCTIONS:  Diet:  Low sodium, heart healthy Activity:  The patient may walk up steps.  He may shower.  He is not to lift more than 10 pounds for 2 weeks.  He is not to drive until after 2 weeks.  He is to continue with his breathing exercise daily.  He  is to walk daily and increase his frequency and duration as he tolerates. Wound care:  He is to use soap and water on his wounds and contact the office if any wound problems arise.  FOLLOWUP APPOINTMENTS: 1. The patient has an appointment to see the nurse for chest tube     suture removal on Oct 02, 2010, at 9:45 a.m. 2. The patient has an appointment to see Dr. Donata Clay on Oct 08, 2010, at 9:30 a.m.  Forty-five minutes prior to this office     appointment, a chest x-ray will be obtained.  DISCHARGE MEDICATIONS:  At the time of dictation include the following: 1. Augmentin 875 mg p.o. two times daily for 10 days. 2. Antiseptic rinse solution (Biotene) 15 mL rinse, two times daily. 3. Folic acid 1 mg p.o. daily. 4. Iron complex 150 mg p.o. daily. 5. Percocet 5/325 one  to two tablets p.o. q.4-6 h. p.o. p.r.n. pain. 6. Enteric-coated aspirin 325 mg p.o. daily. 7. Crestor 20 mg p.o. daily. 8. Exforge 10/320 one tablet p.o. daily. 9. Hydrochlorothiazide 25 mg p.o. daily. 10.Latanoprost ophthalmic eye drops 0.005% one drop in affected eye     daily at bedtime. 11.Metoprolol XL 100 mg one-half tablet p.o. daily. 12.Omeprazole 20 mg p.o. daily.     Doree Fudge, PA   ______________________________ Kerin Perna, M.D.    DZ/MEDQ  D:  09/24/2010  T:  09/24/2010  Job:  [id number]  cc:   Dr. Juanetta Gosling  Electronically Signed by Doree Fudge PA on 09/25/2010 10:04:02 AM Electronically Signed by Kerin Perna M.D. on 09/29/2010 10:25:43 AM

## 2010-10-07 ENCOUNTER — Other Ambulatory Visit: Payer: Self-pay | Admitting: Cardiothoracic Surgery

## 2010-10-07 DIAGNOSIS — D381 Neoplasm of uncertain behavior of trachea, bronchus and lung: Secondary | ICD-10-CM

## 2010-10-08 ENCOUNTER — Ambulatory Visit
Admission: RE | Admit: 2010-10-08 | Discharge: 2010-10-08 | Disposition: A | Discharge status: Home / Self Care | Payer: Self-pay | Source: Ambulatory Visit | Attending: Cardiothoracic Surgery | Admitting: Cardiothoracic Surgery

## 2010-10-08 ENCOUNTER — Encounter (INDEPENDENT_AMBULATORY_CARE_PROVIDER_SITE_OTHER): Payer: Self-pay | Admitting: Cardiothoracic Surgery

## 2010-10-08 DIAGNOSIS — J869 Pyothorax without fistula: Secondary | ICD-10-CM

## 2010-10-08 DIAGNOSIS — D381 Neoplasm of uncertain behavior of trachea, bronchus and lung: Secondary | ICD-10-CM

## 2010-10-09 NOTE — Assessment & Plan Note (Signed)
OFFICE VISIT  SLOANE, JUNKIN DOB:  [birth year]                                        Oct 08, 2010 CHART #:  [id number]  CURRENT PROBLEMS: 1. Status post right video-assisted thoracoscopic surgery and     decortication of empyema from the right lower lobe, Sep 19, 2010. 2. Hypertension. 3. Chronic renal insufficiency, mild to moderate. 4. Sleep apnea.  PRESENT ILLNESS:  The patient is a very nice 65 year old gentleman, turns for his first office visit after undergoing right VATS decortication for empyema.  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  He has now recovered well, but he still has post thoracotomy pain.  There is no fever, productive cough, and the incision is healing well.  PHYSICAL EXAMINATION:  His blood pressure 120/70, pulse 60, saturation room air 97, temperature 98.1.  He is pleasant and alert.  Breath sounds are clear and equal.  The thoracotomy incision is well healed.  He has good range of motion of the right upper extremity.  His chest x-ray shows no significant pleural space disease with some mild atelectasis at the right base.  No infiltrates.  IMPRESSION AND PLAN:  The patient is doing well, and he was told he could resume driving and light activities.  He was given another prescription of pain medication including Percocet and tramadol.  Since he is doing well, I would be happy to see him back for any surgical issues, but otherwise he will be followed up by his primary physician, Dr. Juanetta Gosling.  He was counseled to have a flu vaccine every fall and to report symptoms of bronchitis earlier rather than later to avoid recurrent pneumonia.  Kerin Perna, M.D. Electronically Signed  PV/MEDQ  D:  10/08/2010  T:  10/09/2010  Job:  [id number]  cc:   Ramon Dredge L. Juanetta Gosling, M.D.

## 2010-12-17 DEATH — deceased

## 2012-01-20 ENCOUNTER — Emergency Department (HOSPITAL_COMMUNITY)
Admission: EM | Admit: 2012-01-20 | Discharge: 2012-01-20 | Disposition: A | Discharge status: Home / Self Care | Payer: Medicare Other | Attending: Emergency Medicine | Admitting: Emergency Medicine

## 2012-01-20 ENCOUNTER — Encounter (HOSPITAL_COMMUNITY): Payer: Self-pay

## 2012-01-20 ENCOUNTER — Emergency Department (HOSPITAL_COMMUNITY): Payer: Medicare Other

## 2012-01-20 DIAGNOSIS — S61209A Unspecified open wound of unspecified finger without damage to nail, initial encounter: Secondary | ICD-10-CM | POA: Insufficient documentation

## 2012-01-20 DIAGNOSIS — S61219A Laceration without foreign body of unspecified finger without damage to nail, initial encounter: Secondary | ICD-10-CM

## 2012-01-20 DIAGNOSIS — W298XXA Contact with other powered powered hand tools and household machinery, initial encounter: Secondary | ICD-10-CM | POA: Insufficient documentation

## 2012-01-20 DIAGNOSIS — S62639B Displaced fracture of distal phalanx of unspecified finger, initial encounter for open fracture: Secondary | ICD-10-CM | POA: Insufficient documentation

## 2012-01-20 DIAGNOSIS — I10 Essential (primary) hypertension: Secondary | ICD-10-CM | POA: Insufficient documentation

## 2012-01-20 DIAGNOSIS — Z7982 Long term (current) use of aspirin: Secondary | ICD-10-CM | POA: Insufficient documentation

## 2012-01-20 MED ORDER — LIDOCAINE HCL (PF) 1 % IJ SOLN: 5.0000 mL | Freq: Once | INTRAMUSCULAR | Status: DC

## 2012-01-20 MED ORDER — DEXTROSE 5 % IV SOLN
INTRAVENOUS | Status: AC
  Administered 2012-01-20: 1 g via INTRAVENOUS
  Filled 2012-01-20: qty 10

## 2012-01-20 MED ORDER — OXYCODONE-ACETAMINOPHEN 5-325 MG PO TABS: 1.0000 | ORAL_TABLET | Freq: Four times a day (QID) | ORAL | Status: AC | PRN

## 2012-01-20 MED ORDER — MORPHINE SULFATE 4 MG/ML IJ SOLN
4.0000 mg | Freq: Once | INTRAMUSCULAR | Status: AC
  Administered 2012-01-20: 4 mg via INTRAVENOUS
  Filled 2012-01-20: qty 1

## 2012-01-20 MED ORDER — LIDOCAINE HCL (PF) 1 % IJ SOLN
INTRAMUSCULAR | Status: AC
  Filled 2012-01-20: qty 5

## 2012-01-20 MED ORDER — CEFTRIAXONE SODIUM 1 G IJ SOLR: 1.0000 g | Freq: Once | INTRAMUSCULAR | Status: DC

## 2012-01-20 MED ORDER — DEXTROSE 5 % IV SOLN
1.0000 g | Freq: Once | INTRAVENOUS | Status: AC
  Administered 2012-01-20: 1 g via INTRAVENOUS

## 2012-01-20 MED ORDER — CEPHALEXIN 250 MG PO CAPS: 250.0000 mg | ORAL_CAPSULE | Freq: Four times a day (QID) | ORAL | Status: AC

## 2012-01-20 NOTE — ED Provider Notes (Addendum)
History    This chart was scribed for Tobin Chad, MD, MD by Smitty Pluck. The patient was seen in room APA11 and the patient's care was started at 3:12PM.   CSN: [id number]  Arrival date & time 01/20/12  1242   First MD Initiated Contact with Patient 01/20/12 1512      Chief Complaint  Patient presents with  . Laceration    (Consider location/radiation/quality/duration/timing/severity/associated sxs/prior treatment) Patient is a 66 y.o. male presenting with skin laceration. The history is provided by the patient.  Laceration    Luis Donovan is a 66 y.o. male who presents to the Emergency Department complaining of laceration to 4th digit of left hand onset today at [facility]. Pt reports using a table saw and accidentally cutting his finger. Bleeding is controlled. Denies any other pain.  Pt goes to [facility] for cardiology.   Past Medical History  Diagnosis Date  . Hypertension     Past Surgical History  Procedure Date  . Cardiac defibrillator placement   . Lung surgery   . Tendon repair     No family history on file.  History  Substance Use Topics  . Smoking status: Never Smoker   . Smokeless tobacco: Not on file  . Alcohol Use: No      Review of Systems  Constitutional: Negative for fever and fatigue.  HENT: Negative.   Eyes: Negative.   Respiratory: Negative.   Cardiovascular: Negative.   Gastrointestinal: Negative.   Genitourinary: Negative.   Musculoskeletal:       Pain limited to injured finger.  C/o localized pain.    Neurological: Negative.   Hematological: Negative.   All other systems reviewed and are negative.  10 Systems reviewed and all are negative for acute change except as noted in the HPI.    Allergies  Review of patient's allergies indicates no known allergies.  Home Medications   Current Outpatient Rx  Name Route Sig Dispense Refill  . ASPIRIN 325 MG PO TABS Oral Take 325 mg by mouth daily.    . IBUPROFEN 200 MG PO TABS Oral  Take 800 mg by mouth daily as needed. For pain    . TRAMADOL HCL 50 MG PO TABS Oral Take 50 mg by mouth every 6 (six) hours as needed. For pain      BP 148/79  Pulse 58  Temp 97.8 F (36.6 C) (Oral)  Resp 18  SpO2 100%  Physical Exam  Nursing note and vitals reviewed. Constitutional: He is oriented to person, place, and time. He appears well-developed and well-nourished. No distress.  HENT:  Head: Normocephalic and atraumatic.  Right Ear: External ear normal.  Left Ear: External ear normal.  Mouth/Throat: Oropharynx is clear and moist.  Eyes: Conjunctivae are normal. Pupils are equal, round, and reactive to light. Right eye exhibits no discharge. Left eye exhibits no discharge. No scleral icterus.  Neck: Normal range of motion. Neck supple. No tracheal deviation present.  Cardiovascular: Normal rate, regular rhythm, normal heart sounds and intact distal pulses.  Exam reveals no gallop and no friction rub.   No murmur heard. Pulmonary/Chest: Effort normal and breath sounds normal. No stridor. No respiratory distress. He has no wheezes. He has no rales. He exhibits no tenderness.  Abdominal: Soft. Bowel sounds are normal.  Musculoskeletal: He exhibits no edema.       Note extremities intact with exception of distal tip of left fourth finger. Note that there is a oblique partial avulsion of the  tip of the finger. This runs along the dorsal aspect of the finger and includes the entire fingernail and nailbed up to the first joint. There is no obvious deformity of the finger except for the avulsion. Note small amount of venous oozing, with visible bony fragment distal tip. Note intact range of motion.  Neurological: He is alert and oriented to person, place, and time. No cranial nerve deficit.  Skin: Skin is warm. No rash noted. He is not diaphoretic. No erythema. No pallor.       Partial avulsion of dorsal tip including entire fingernail.  Small laceration/abrasion on adjacent middle finger  (superficial)  Psychiatric: He has a normal mood and affect. His behavior is normal.    ED Course  Procedures (including critical care time) DIAGNOSTIC STUDIES: Oxygen Saturation is 100% on room air, normal by my interpretation.    COORDINATION OF CARE: 3:14PM Discussed pt ED treatment plan with pt.  4:24PM Ordered:  Medications  aspirin 325 MG tablet (not administered)  ibuprofen (ADVIL,MOTRIN) 200 MG tablet (not administered)  traMADol (ULTRAM) 50 MG tablet (not administered)  lidocaine (XYLOCAINE) 1 % injection 5 mL (not administered)  cefTRIAXone (ROCEPHIN) injection 1 g (not administered)  morphine 4 MG/ML injection 4 mg (not administered)  dextrose 5 % with cefTRIAXone (ROCEPHIN) ADS Med (not administered)    Labs Reviewed - No data to display Dg Finger Ring Left  01/20/2012  *RADIOLOGY REPORT*  Clinical Data: Laceration to the tip of the left ring finger.  LEFT RING FINGER 2+V  Comparison: None.  Findings: There is an avulsion of the dorsal aspect of the tuft of the distal phalangeal bone as well as soft tissue avulsion.  Minimal degenerative changes of the DIP joint.  IMPRESSION: Partial avulsion of the tuft of the distal phalangeal bone.   Original Report Authenticated By: Gwynn Burly, M.D.      No diagnosis found.    MDM  Patient presents for evaluation of an avulsion of his left fourth fingertip. His tetanus is up-to-date as he received a tetanus shot last month after a laceration. 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 Was also contaminated as this injury happened using any circular saw, will start antibiotics.   1610.  Discussed hand injury with Dr. Romeo Apple (ortho).  He suggests consultation with a hand specialist.  1635.  Discussed injury with Dr. Izora Ribas (hand specialist).  Plan wound washout, cover with xeroform, splint with bulky dressing, D/C home.  He will  follow-up in office tomorrow with Dr. Izora Ribas.  Pt is receiving IV ceftriaxone now, will provide prescription for keflex.  1725.  ABX completed.  I applied splint and dressing.  Plan D/C home.    Tobin Chad, MD 01/20/12 1640  Tobin Chad, MD 01/20/12 1726

## 2012-01-20 NOTE — ED Notes (Signed)
Pt with laceration to left ring finger of tip after cutting on a table saw, last tetanus shot was last month per pt, admits to taking 4 tramadol PTA

## 2012-01-20 NOTE — ED Notes (Signed)
Was using table saw and got left hand 4th finger caught, laceration noted, bleeding controlled w/ dressing.

## 2012-04-13 ENCOUNTER — Other Ambulatory Visit (HOSPITAL_COMMUNITY): Payer: Self-pay | Admitting: Pulmonary Disease

## 2012-04-13 ENCOUNTER — Ambulatory Visit (HOSPITAL_COMMUNITY)
Admission: RE | Admit: 2012-04-13 | Discharge: 2012-04-13 | Disposition: A | Discharge status: Home / Self Care | Payer: Medicare Other | Source: Ambulatory Visit | Attending: Pulmonary Disease | Admitting: Pulmonary Disease

## 2012-04-13 DIAGNOSIS — R079 Chest pain, unspecified: Secondary | ICD-10-CM

## 2012-09-14 IMAGING — CR DG CHEST 2V
2 series · 2 of 2 positions shown · non-contrast
Comparison: 11/23/2003

CLINICAL DATA: Pneumonia

CHEST - 2 VIEW

[view not recorded (1 of 2)]
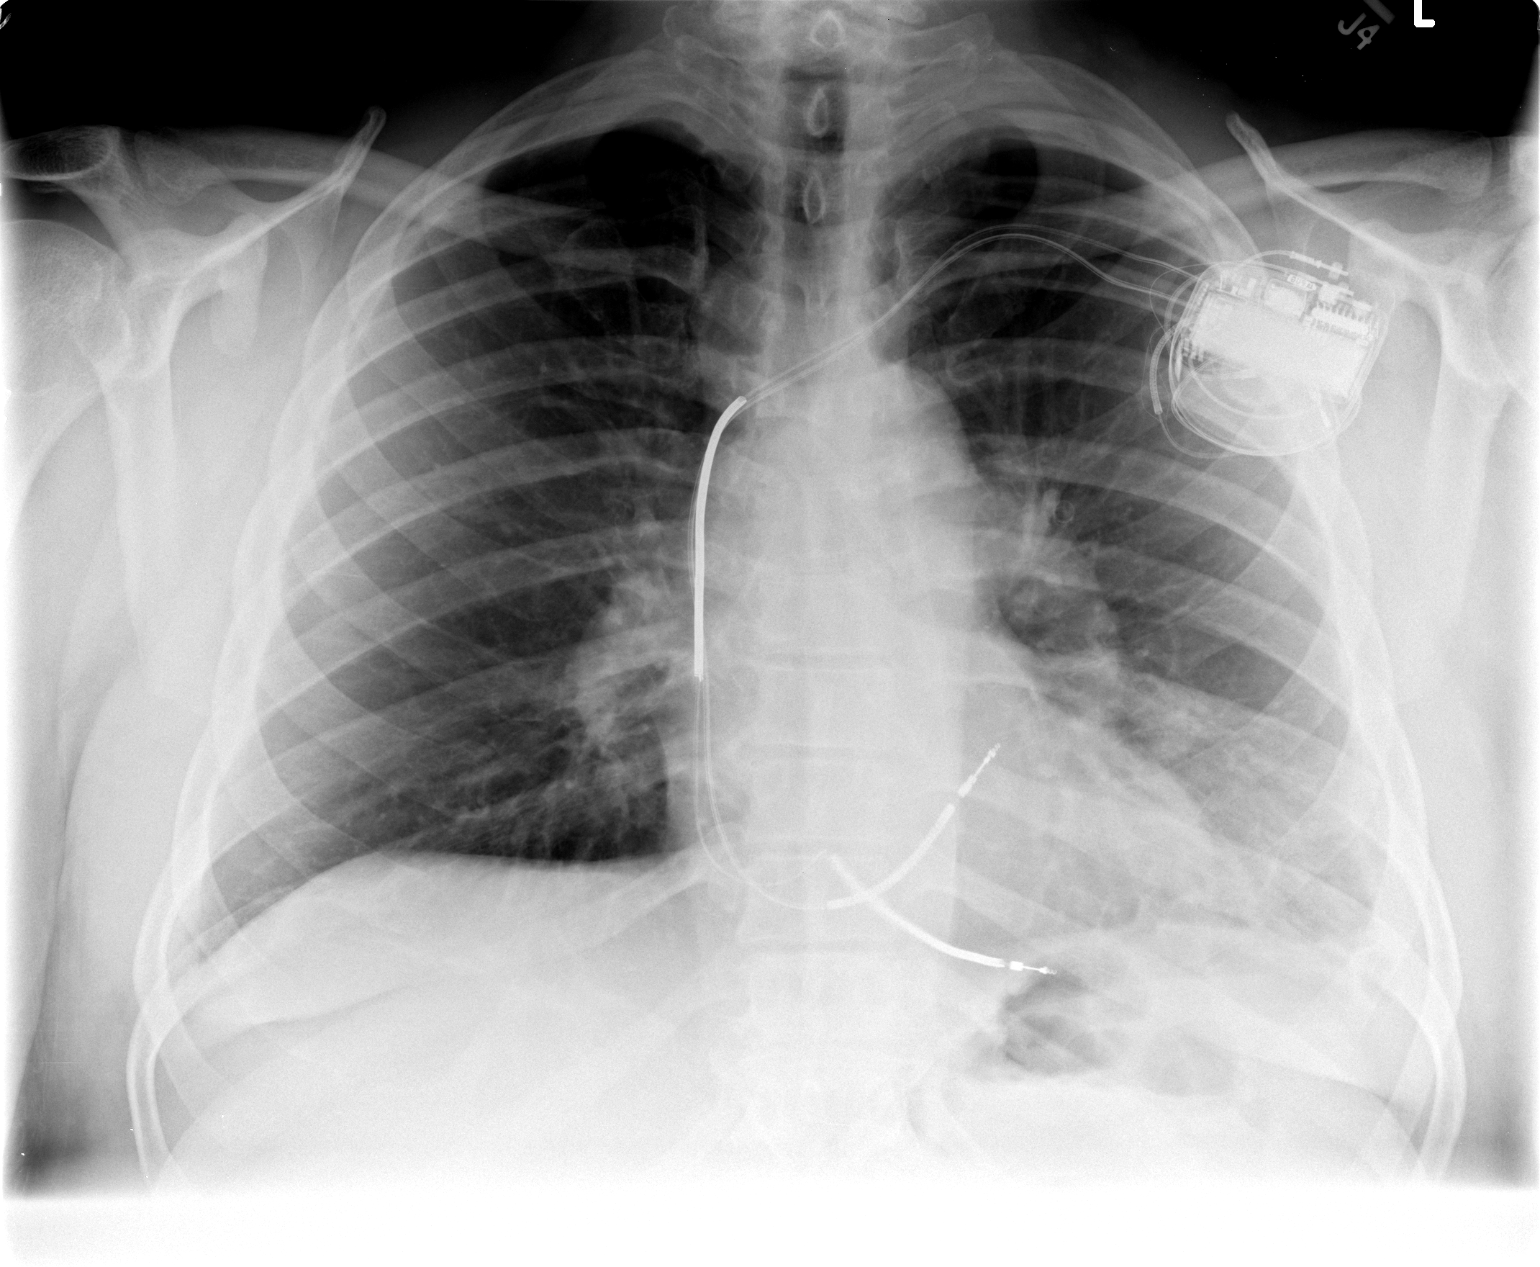

[view not recorded (2 of 2)]
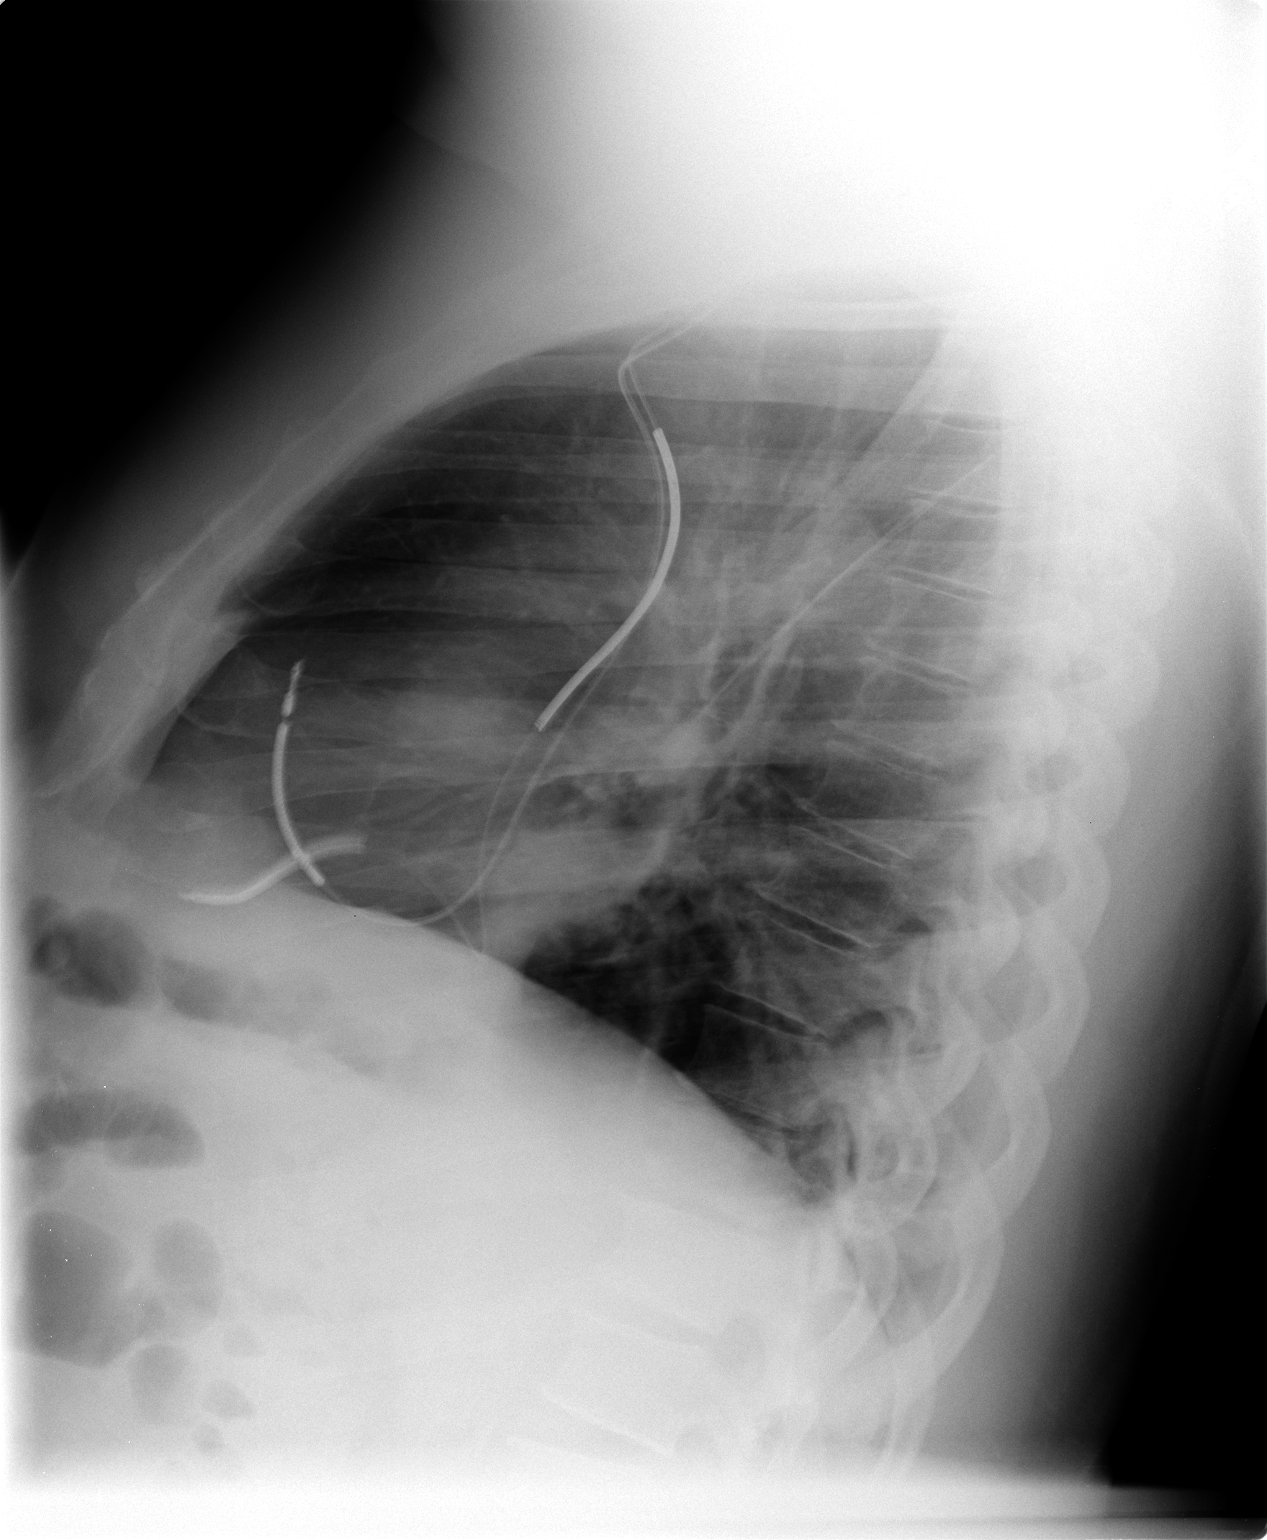

[2 of 2 positions shown; findings below may reference images not displayed]

FINDINGS: A second lead has been placed from the left subclavian
AICD generator pack and its tip is in the right ventricle as well
as the original lead.  Heterogeneous opacities at the left base
worrisome for developing airspace disease.  Pleural changes are
present at the left base.
IMPRESSION: Left lower lobe airspace disease.

AICD revision.

## 2012-09-17 IMAGING — US US RENAL
1 series · 14 of 21 positions shown · non-contrast
Comparison: Ultrasound of the abdomen of 01/22/2004

CLINICAL DATA: Renal failure

RENAL/URINARY TRACT ULTRASOUND COMPLETE

[Series 1: us renal · 0.31mm/px · 14 of 21 slices shown]
[im 1/21]
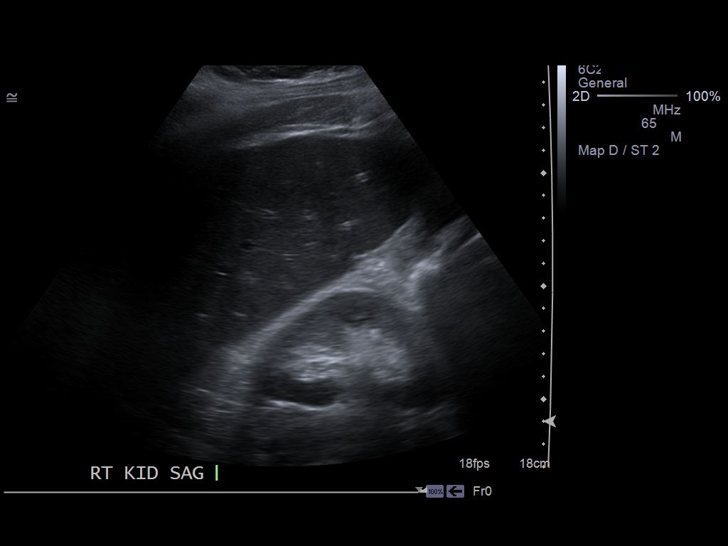
[im 3/21]
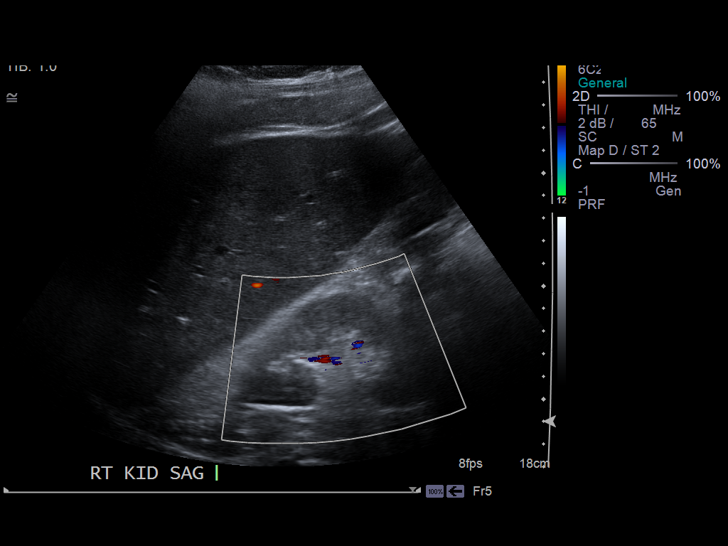
[im 4/21]
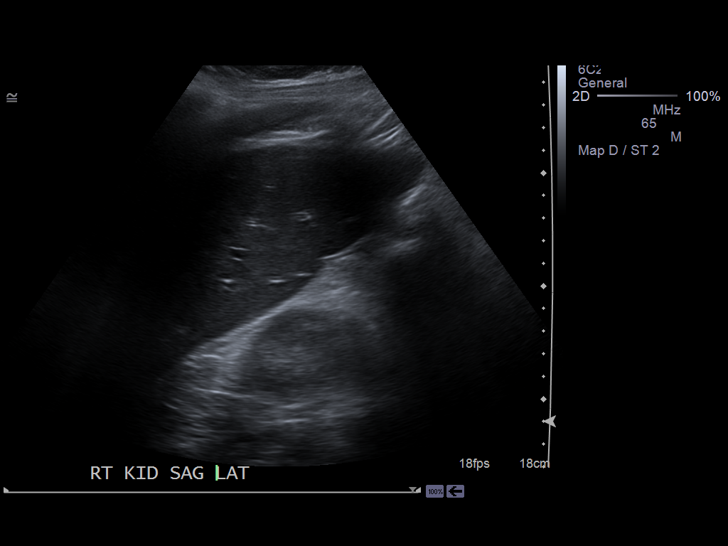
[im 6/21]
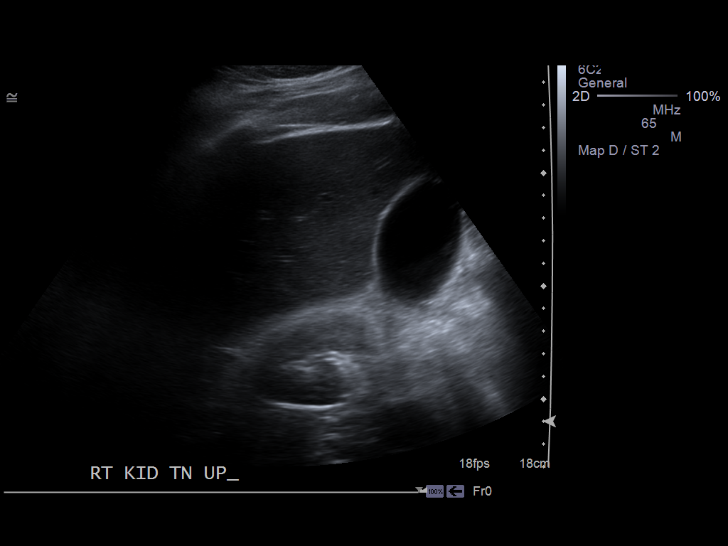
[im 7/21]
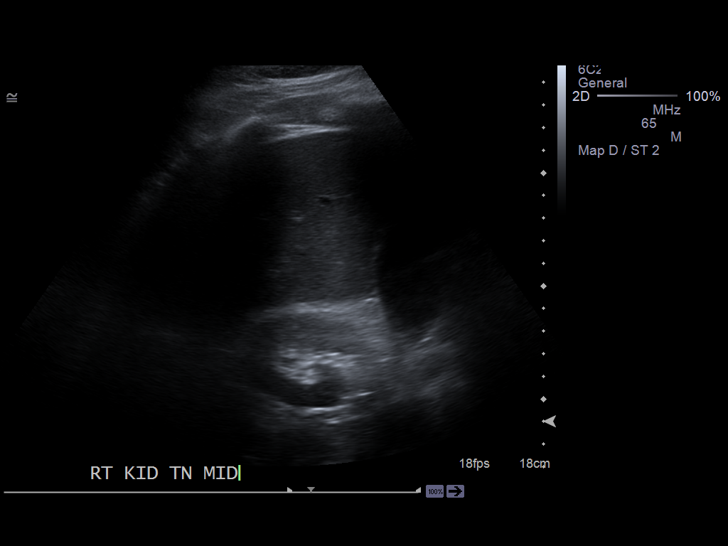
[im 9/21]
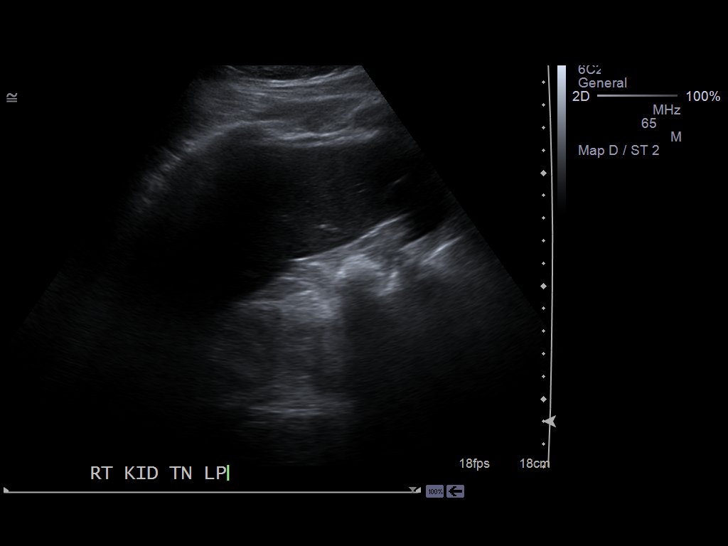
[im 10/21]
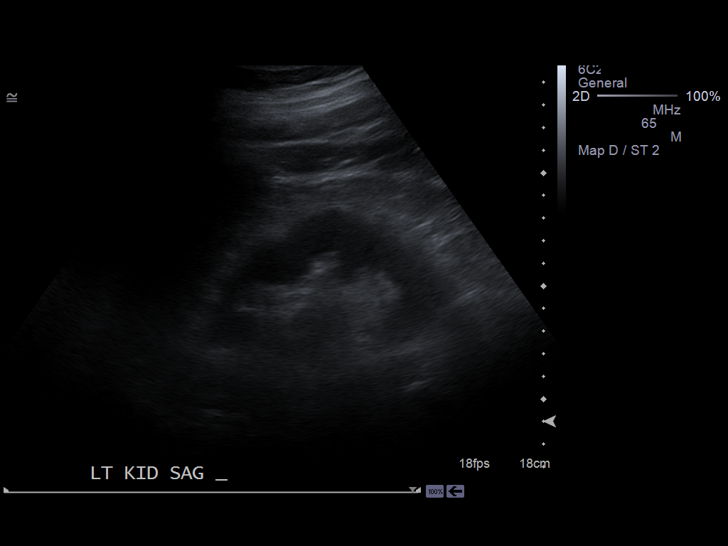
[im 12/21]
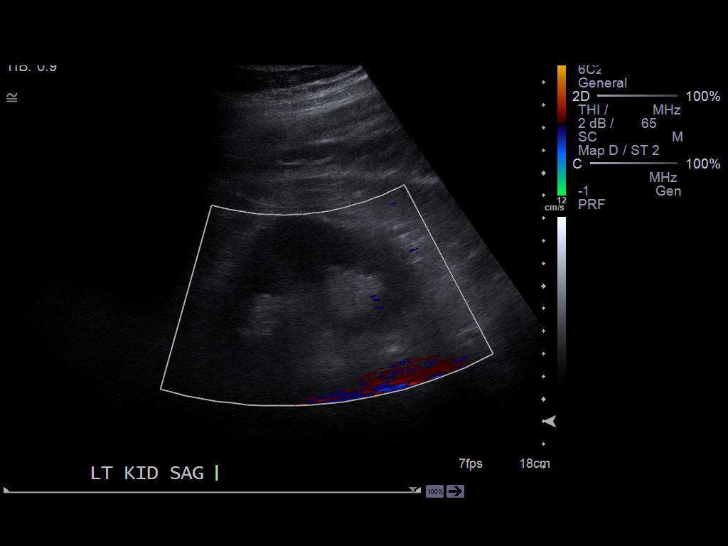
[im 13/21]
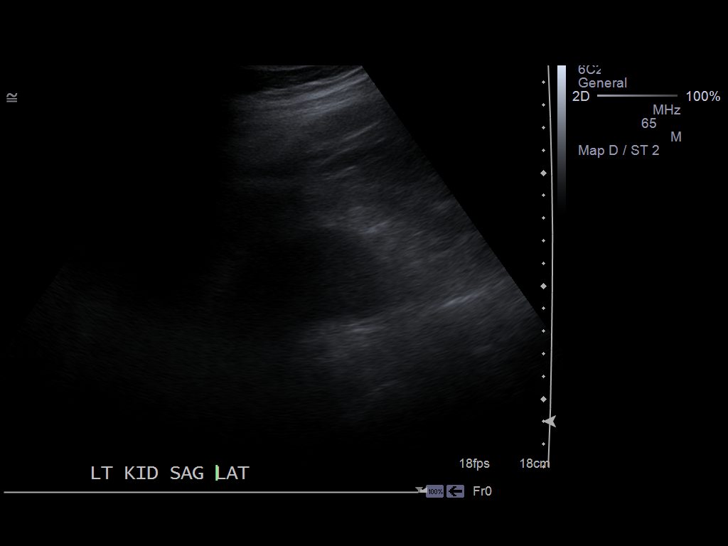
[im 15/21]
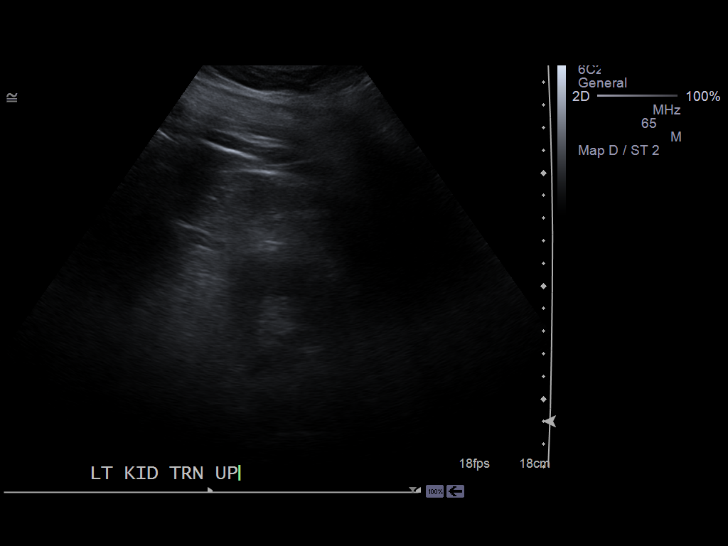
[im 16/21]
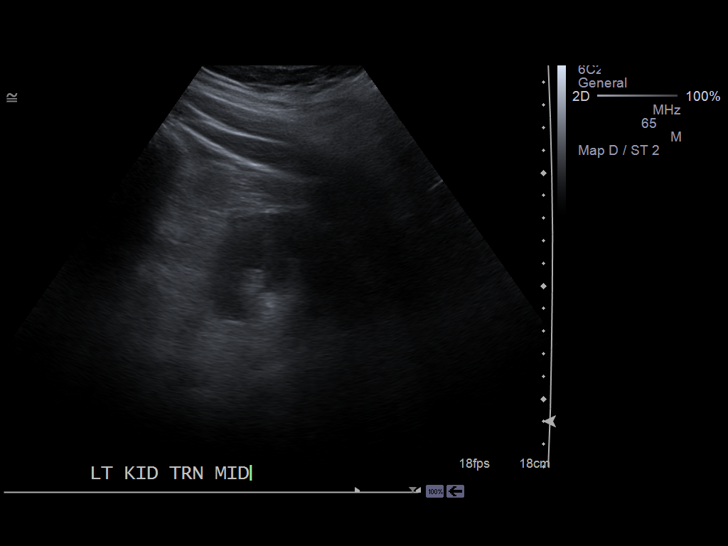
[im 18/21]
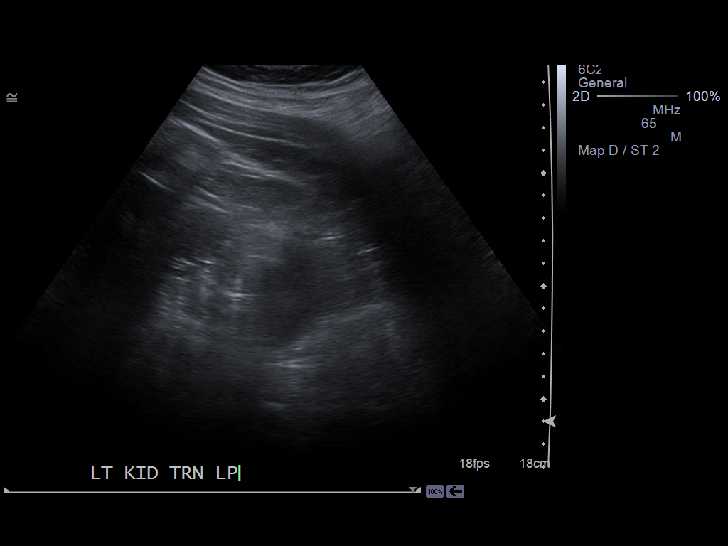
[im 19/21]
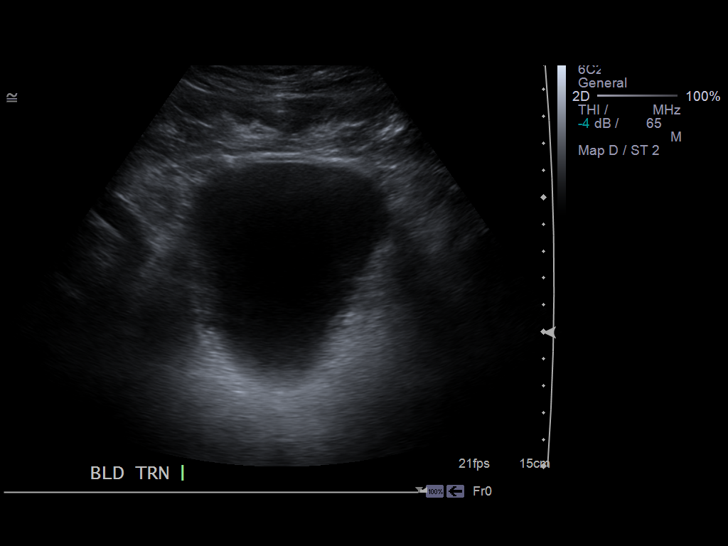
[im 21/21]
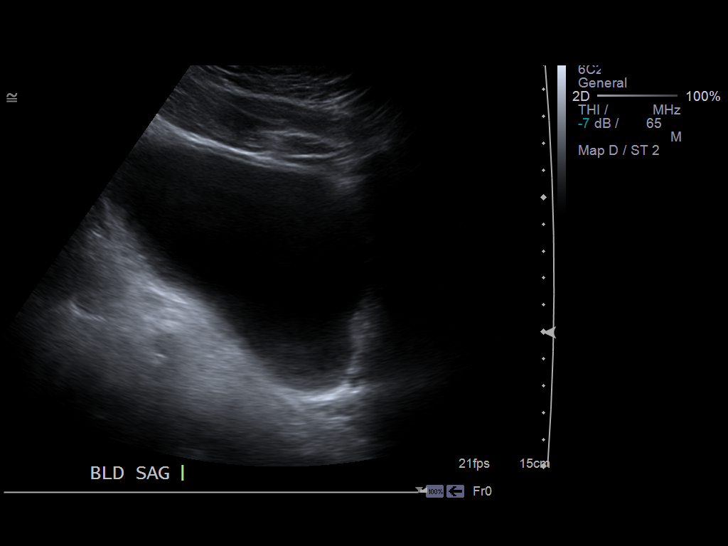

[14 of 21 positions shown; findings below may reference images not displayed]

FINDINGS: Right Kidney:  The right kidney measures 8.6 cm sagittally.  No
hydronephrosis is seen.  A discrepancy in length of greater than 2
cm suggests an element of atrophy.

Left Kidney:  No hydronephrosis and the left kidney measures
cm.

Bladder:  The urinary bladder is unremarkable.
IMPRESSION: No hydronephrosis.  There is discrepancy in renal size with the
right kidney smaller than the left as noted above.

## 2012-09-17 IMAGING — NM NM PULMONARY VENT & PERF
2 series · 12 of 12 positions shown · non-contrast
Comparison: Chest radiograph 09/12/2010

CLINICAL DATA: Short of breath, elevated D-dimer.  The

NUCLEAR MEDICINE VENTILATION - PERFUSION LUNG SCAN
TECHNIQUE: Wash-in, equilibrium, and wash-out phase ventilation
images were obtained using 0e-G55 gas.  Perfusion images were
obtained in multiple projections after intravenous injection of Tc-
99m MAA.
Radiopharmaceuticals:  32 mCi 0e-G55 gas and 5.1 mCi Hc-VVm MAA.

[lung vq · 3.20mm/px · 6 of 16 frames shown (1 of 2)]
[frame 2/16]
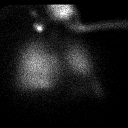
[frame 4/16]
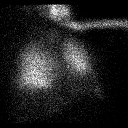
[frame 7/16]
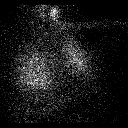
[frame 10/16]
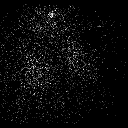
[frame 12/16]
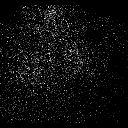
[frame 15/16]
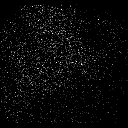

[lung vq · 3.20mm/px · 6 of 16 frames shown (2 of 2)]
[frame 2/16  full-range]
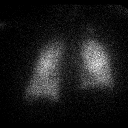
[frame 4/16  full-range]
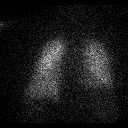
[frame 7/16  full-range]
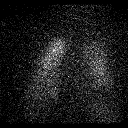
[frame 10/16  full-range]
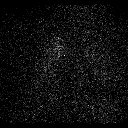
[frame 12/16  full-range]
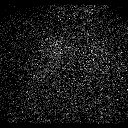
[frame 15/16  full-range]
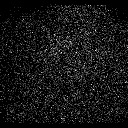

[12 of 12 positions shown; findings below may reference images not displayed]

FINDINGS: Ventilation:  Decreased ventilation to the lingular region.  Not
significant air trapping.

Perfusion:  No wedge shaped peripheral perfusion defects to suggest
acute pulmonary embolism.  Decreased perfusion to  the lingula
matches ventilation defect and  is not wedge-shaped.
IMPRESSION: Very low probability for acute pulmonary embolism.

## 2013-06-09 ENCOUNTER — Encounter: Payer: Self-pay | Admitting: Family Medicine

## 2013-06-22 ENCOUNTER — Other Ambulatory Visit (HOSPITAL_COMMUNITY): Payer: Self-pay | Admitting: Pulmonary Disease

## 2013-06-22 DIAGNOSIS — R51 Headache: Principal | ICD-10-CM

## 2013-06-22 DIAGNOSIS — G8929 Other chronic pain: Secondary | ICD-10-CM

## 2013-06-26 ENCOUNTER — Ambulatory Visit (HOSPITAL_COMMUNITY): Payer: Medicare PPO

## 2013-06-27 ENCOUNTER — Ambulatory Visit (HOSPITAL_COMMUNITY)
Admission: RE | Admit: 2013-06-27 | Discharge: 2013-06-27 | Disposition: A | Discharge status: Home / Self Care | Payer: Medicare PPO | Source: Ambulatory Visit | Attending: Pulmonary Disease | Admitting: Pulmonary Disease

## 2013-06-27 DIAGNOSIS — R519 Headache, unspecified: Secondary | ICD-10-CM

## 2013-06-27 DIAGNOSIS — R51 Headache: Secondary | ICD-10-CM | POA: Insufficient documentation

## 2013-07-17 ENCOUNTER — Encounter: Payer: Self-pay | Admitting: Neurology

## 2013-07-17 ENCOUNTER — Ambulatory Visit (INDEPENDENT_AMBULATORY_CARE_PROVIDER_SITE_OTHER): Payer: Medicare PPO | Admitting: Neurology

## 2013-07-17 ENCOUNTER — Encounter (INDEPENDENT_AMBULATORY_CARE_PROVIDER_SITE_OTHER): Payer: Self-pay

## 2013-07-17 VITALS — BP 137/77 | HR 55 | Ht 68.0 in | Wt 248.0 lb

## 2013-07-17 DIAGNOSIS — G4733 Obstructive sleep apnea (adult) (pediatric): Secondary | ICD-10-CM

## 2013-07-17 DIAGNOSIS — I6350 Cerebral infarction due to unspecified occlusion or stenosis of unspecified cerebral artery: Secondary | ICD-10-CM

## 2013-07-17 DIAGNOSIS — I639 Cerebral infarction, unspecified: Secondary | ICD-10-CM | POA: Insufficient documentation

## 2013-07-17 DIAGNOSIS — R42 Dizziness and giddiness: Secondary | ICD-10-CM

## 2013-07-17 DIAGNOSIS — Z9989 Dependence on other enabling machines and devices: Secondary | ICD-10-CM

## 2013-07-17 HISTORY — DX: Cerebral infarction, unspecified: I63.9

## 2013-07-17 NOTE — Progress Notes (Signed)
Subjective:    Patient ID: Luis Donovan is a 68 y.o. male.  HPI    Star Age, MD, PhD [facility] [address], [address] [place], [place] [address]  Dear Dr. Luan Pulling,  I saw your patient, [name], upon your kind request in my neurologic clinic today for initial consultation of his dizziness and recent noncontrast head CT, which was reported as abnormal. The patient is accompanied by his wife today. As you know, [name] is a very pleasant 68 year old right-handed gentleman with an underlying medical history of stroke in 1990, obesity, reflux esophagitis, obstructive sleep apnea, hyperlipidemia, hypertension, PTSD, heart disease, status post implantable cardioverter defibrillator placement after out of hospital cardiac arrest in 2005, who presented to your office earlier last month with a complaint of heavy feeling in his head and some other vague neurological symptoms several weeks prior. 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 No further evidence of focal acute intracranial abnormalities. In addition, I reviewed the images through the PACS system. At the time of his stroke he recalls having had blurry vision and he was left with residual R VF defect.  He had a dizzy spell some 3 weeks ago, felt jittery and nervous and wonders, if his blood sugar was low at the time. He ate something and felt better after about 30 minutes. Since then, he has had intermittent milder episodes, but finger stick BS has been in the normal range. He has OGTT on 06/09/13, which I reviewed: at hour 3, his BS did drop to 57, but did not have Sx at the time. He has  His vascular risk factors include age, gender, smoking (quit in 1990), heart disease, hypertension, hyperlipidemia, obstructive sleep apnea, FHx of stroke and heart disease, obesity. He quit  drinking EtOH in 1990, daily and heavy at one point in time.  He does not always use his CPAP every night, but is willing to do it.   His Past Medical History Is Significant For: Past Medical History  Diagnosis Date  . Hypertension   . CVA (cerebral vascular accident) 07/17/2013    His Past Surgical History Is Significant For: Past Surgical History  Procedure Laterality Date  . Cardiac defibrillator placement    . Lung surgery    . Tendon repair      His Family History Is Significant For: No family history on file.  His Social History Is Significant For: History   Social History  . Marital Status: Married    Spouse Name: beverly    Number of Children: 4  . Years of Education: N/A   Occupational History  . retired    Social History Main Topics  . Smoking status: Former Research scientist (life sciences)  . Smokeless tobacco: None  . Alcohol Use: No  . Drug Use: No  . Sexual Activity: No   Other Topics Concern  . None   Social History Narrative  . None    His Allergies Are:  No Known Allergies:   His Current Medications Are:  Outpatient Encounter Prescriptions as of 07/17/2013  Medication Sig  . amLODipine-olmesartan (AZOR) 10-40 MG per tablet Take 1 tablet by mouth daily.  Marland Kitchen aspirin 325 MG tablet Take 325 mg by mouth daily.  Marland Kitchen FLUoxetine (PROZAC) 20 MG capsule Take 20 mg by mouth daily.  . hydrochlorothiazide (HYDRODIURIL) 25 MG tablet Take 25 mg  by mouth daily.  Marland Kitchen ibuprofen (ADVIL,MOTRIN) 200 MG tablet Take 800 mg by mouth daily as needed. For pain  . latanoprost (XALATAN) 0.005 % ophthalmic solution Place 1 drop into both eyes at bedtime.  . metoprolol succinate (TOPROL XL) 100 MG 24 hr tablet Take 100 mg by mouth daily. Take with or immediately following a meal.1/2 a pill daily  . omeprazole (PRILOSEC) 20 MG capsule Take 20 mg by mouth daily.  . traMADol (ULTRAM) 50 MG tablet Take 50 mg by mouth every 6 (six) hours as needed. For pain   Review of Systems:  Out of a complete 14 point  review of systems, all are reviewed and negative with the exception of these symptoms as listed below:   Review of Systems  Respiratory:       Snoring  Musculoskeletal:       Joint pain  Skin:       moles  Neurological: Positive for headaches.    Objective:  Neurologic Exam  Physical Exam Physical Examination:   Filed Vitals:   07/17/13 1356  BP: 137/77  Pulse: 55   His sitting BP 133/70, P 49.  General Examination: The patient is a very pleasant 68 y.o. male in no acute distress. He appears well-developed and well-nourished and well groomed. He is obese.   HEENT: Normocephalic, atraumatic, pupils are equal, round and reactive to light and accommodation. Funduscopic exam is normal with sharp disc margins noted. 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  Extraocular tracking is good without limitation to gaze excursion or nystagmus noted. Normal smooth pursuit is noted. Hearing is grossly intact. Tympanic membranes are clear bilaterally. Face is symmetric with normal facial animation and normal facial sensation. Speech is clear with no dysarthria noted. There is no hypophonia. There is no lip, neck/head, jaw or voice tremor. Neck is supple with full range of passive and active motion. There are no carotid bruits on auscultation. Oropharynx exam reveals: mild mouth dryness, adequate dental hygiene and marked airway crowding, due to larger tongue, tighter airway entry, thicker soft palate. Mallampati is class II. Tongue protrudes centrally and palate elevates symmetrically.   Chest: Clear to auscultation without wheezing, rhonchi or crackles noted.  Heart: S1+S2+0, regular and normal without murmurs, rubs or gallops noted.   Abdomen: Soft, non-tender and non-distended with normal bowel sounds appreciated on auscultation.  Extremities: There is no pitting edema in the distal lower extremities bilaterally. Pedal pulses are intact.  Skin: Warm  and dry without trophic changes noted. There are no varicose veins.  Musculoskeletal: exam reveals no obvious joint deformities, tenderness or joint swelling or erythema.   Neurologically:  Mental status: The patient is awake, alert and oriented in all 4 spheres. His immediate and remote memory, attention, language skills and fund of knowledge are appropriate. There is no evidence of aphasia, agnosia, apraxia or anomia. Speech is clear with normal prosody and enunciation. Thought process is linear. Mood is normal and affect is normal.  Cranial nerves II - XII are as described above under HEENT exam. In addition: shoulder shrug is normal with equal shoulder height noted. Motor exam: Normal bulk, strength and tone is noted. There is no drift, tremor or rebound. Romberg is negative. Reflexes are 1+ in the UEs and trace in the LEs. Babinski: Toes are flexor bilaterally. Fine motor skills and coordination: intact with normal finger taps, normal hand movements, normal rapid alternating patting, normal foot taps and normal foot agility.  Cerebellar testing:  No dysmetria or intention tremor on finger to nose testing. Heel to shin is unremarkable bilaterally. There is no truncal or gait ataxia.  Sensory exam: intact to light touch, pinprick, vibration, temperature sense in the upper and lower extremities.  Gait, station and balance: He stands with ease. No veering to one side is noted. No leaning to one side is noted. Posture is age-appropriate and stance is narrow based. Gait shows normal stride length and normal pace. No problems turning are noted. He turns en bloc. Tandem walk is unremarkable. Intact toe and heel stance is noted.               Assessment and Plan:   In summary, MONSERRAT LINAREZ is a very pleasant 68 y.o.-year old male with an underlying medical history of stroke in 1990, obesity, reflux esophagitis, obstructive sleep apnea, hyperlipidemia, hypertension, PTSD, smoking prior to stroke, heart  disease, status post implantable cardioverter defibrillator placement after out of hospital cardiac arrest in 2005, who presents with a history of intermittent dizziness. I do not detect any new focal neurologic deficit and he is not orthostatic today. He has a history of a stroke and has residual visual field deficit which is not new. He has multiple stroke risk factors. I talked to him and his wife at length today. We talked about secondary stroke prevention. He is advised to continue with aspirin. He is on Crestor for cholesterol management. He had a for a glucose tolerance test which did show some lower numbers. I explained to him that dizziness is a vague symptom. It can signify dehydration, low blood pressure, low blood sugar values. I advised him to stay well-hydrated, eat 5 smaller meals per day and focus on eating complex course rather than simple carbs and also eat more protein. We talked about weight loss and exercise. He is trying to lose weight. He has not been 100% compliant with CPAP use and is encouraged to be fully compliant with CPAP as obstructive sleep apnea is an independent risk factor for stroke. I asked him to change positions slowly. He does not endorse any true vertiginous symptoms. I do not detect any underlying neurological issue for his dizziness. As far as medications are concerned, I recommended the following at this time: no change.  I answered all their questions today and the patient and his wife were in agreement with the above outlined plan. I think I can see him back on an as-needed basis. Managing cholesterol, blood pressure, blood sugar, and weight RT for him. We talked about weight loss and exercise. He is encouraged to exercise regularly and he is motivated.  Thank you very much for allowing me to participate in the care of this nice patient. If I can be of any further assistance to you please do not hesitate to call me at [phone].  Sincerely,   Star Age, MD,  PhD

## 2013-07-17 NOTE — Patient Instructions (Addendum)
Please take your medications as directed. As discussed, secondary prevention is key after a stroke. This means: taking care of blood sugar values or diabetes management, good blood pressure (hypertension) control and optimizing cholesterol management, exercising daily or regularly within your own mobility limitations of course and overall cardiovascular risk factor reduction, which includes treatment of obstructive sleep apnea (OSA) with CPAP.   Please remember, that dizziness is a vague problem and indicate low blood sugar, low blood pressure and dehydration. Please change positions slowly and always stay well-hydrated. Eat 5 smaller meals per day and eat complex carbs, not simple carbs. While there is no specific medication that helps with dizziness.   Please meet with the nutritionist at the [place], who may be a good resource for you.

## 2014-03-16 ENCOUNTER — Encounter (HOSPITAL_COMMUNITY): Payer: Self-pay | Admitting: Emergency Medicine

## 2014-03-16 ENCOUNTER — Emergency Department (HOSPITAL_COMMUNITY): Payer: Medicare PPO

## 2014-03-16 ENCOUNTER — Emergency Department (HOSPITAL_COMMUNITY)
Admission: EM | Admit: 2014-03-16 | Discharge: 2014-03-16 | Disposition: A | Discharge status: Home / Self Care | Payer: Medicare PPO | Attending: Emergency Medicine | Admitting: Emergency Medicine

## 2014-03-16 DIAGNOSIS — S01511A Laceration without foreign body of lip, initial encounter: Secondary | ICD-10-CM | POA: Diagnosis present

## 2014-03-16 DIAGNOSIS — Y939 Activity, unspecified: Secondary | ICD-10-CM | POA: Diagnosis not present

## 2014-03-16 DIAGNOSIS — I10 Essential (primary) hypertension: Secondary | ICD-10-CM | POA: Diagnosis not present

## 2014-03-16 DIAGNOSIS — W19XXXA Unspecified fall, initial encounter: Secondary | ICD-10-CM

## 2014-03-16 DIAGNOSIS — W01198A Fall on same level from slipping, tripping and stumbling with subsequent striking against other object, initial encounter: Secondary | ICD-10-CM | POA: Diagnosis not present

## 2014-03-16 DIAGNOSIS — Y929 Unspecified place or not applicable: Secondary | ICD-10-CM | POA: Diagnosis not present

## 2014-03-16 DIAGNOSIS — Z8673 Personal history of transient ischemic attack (TIA), and cerebral infarction without residual deficits: Secondary | ICD-10-CM | POA: Diagnosis not present

## 2014-03-16 DIAGNOSIS — Z7982 Long term (current) use of aspirin: Secondary | ICD-10-CM | POA: Insufficient documentation

## 2014-03-16 DIAGNOSIS — S4991XA Unspecified injury of right shoulder and upper arm, initial encounter: Secondary | ICD-10-CM | POA: Diagnosis not present

## 2014-03-16 DIAGNOSIS — Z87891 Personal history of nicotine dependence: Secondary | ICD-10-CM | POA: Diagnosis not present

## 2014-03-16 DIAGNOSIS — Z79899 Other long term (current) drug therapy: Secondary | ICD-10-CM | POA: Diagnosis not present

## 2014-03-16 DIAGNOSIS — M25511 Pain in right shoulder: Secondary | ICD-10-CM

## 2014-03-16 MED ORDER — AMOXICILLIN 500 MG PO CAPS
500.0000 mg | ORAL_CAPSULE | Freq: Two times a day (BID) | ORAL | Status: DC
Start: 2014-03-16 — End: 2022-11-03

## 2014-03-16 NOTE — Discharge Instructions (Signed)
Facial Laceration A facial laceration is a cut on the face. These injuries can be painful and cause bleeding. Some cuts may need to be closed with stitches (sutures), skin adhesive strips, or wound glue. Cuts usually heal quickly but can leave a scar. It can take 1-2 years for the scar to go away completely. HOME CARE   Only take medicines as told by your doctor.  Follow your doctor's instructions for wound care. For Stitches:  Keep the cut clean and dry.  If you have a bandage (dressing), change it at least once a day. Change the bandage if it gets wet or dirty, or as told by your doctor.  Wash the cut with soap and water 2 times a day. Rinse the cut with water. Pat it dry with a clean towel.  Put a thin layer of medicated cream on the cut as told by your doctor.  You may shower after the first 24 hours. Do not soak the cut in water until the stitches are removed.  Have your stitches removed as told by your doctor.  Do not wear any makeup until a few days after your stitches are removed. For Skin Adhesive Strips:  Keep the cut clean and dry.  Do not get the strips wet. You may take a bath, but be careful to keep the cut dry.  If the cut gets wet, pat it dry with a clean towel.  The strips will fall off on their own. Do not remove the strips that are still stuck to the cut. For Wound Glue:  You may shower or take baths. Do not soak or scrub the cut. Do not swim. Avoid heavy sweating until the glue falls off on its own. After a shower or bath, pat the cut dry with a clean towel.  Do not put medicine or makeup on your cut until the glue falls off.  If you have a bandage, do not put tape over the glue.  Avoid lots of sunlight or tanning lamps until the glue falls off.  The glue will fall off on its own in 5-10 days. Do not pick at the glue. After Healing: Put sunscreen on the cut for the first year to reduce your scar. GET HELP RIGHT AWAY IF:   Your cut area gets red,  painful, or puffy (swollen).  You see a yellowish-white fluid (pus) coming from the cut.  You have chills or a fever. MAKE SURE YOU:   Understand these instructions.  Will watch your condition.  Will get help right away if you are not doing well or get worse. Document Released: 10/21/2007 Document Revised: 02/22/2013 Document Reviewed: 12/15/2012 [facility] Patient Information 2015 [place], [place]. This information is not intended to replace advice given to you by your health care provider. Make sure you discuss any questions you have with your health care provider.  Fall Prevention and Home Safety Falls cause injuries and can affect all age groups. It is possible to prevent falls.  HOW TO PREVENT FALLS  Wear shoes with rubber soles that do not have an opening for your toes.  Keep the inside and outside of your house well lit.  Use night lights throughout your home.  Remove clutter from floors.  Clean up floor spills.  Remove throw rugs or fasten them to the floor with carpet tape.  Do not place electrical cords across pathways.  Put grab bars by your tub, shower, and toilet. Do not use towel bars as grab bars.  Put  handrails on both sides of the stairway. Fix loose handrails.  Do not climb on stools or stepladders, if possible.  Do not wax your floors.  Repair uneven or unsafe sidewalks, walkways, or stairs.  Keep items you use a lot within reach.  Be aware of pets.  Keep emergency numbers next to the telephone.  Put smoke detectors in your home and near bedrooms. Ask your doctor what other things you can do to prevent falls. Document Released: 02/28/2009 Document Revised: 11/03/2011 Document Reviewed: 08/04/2011 [facility] Patient Information 2015 [place], [place]. This information is not intended to replace advice given to you by your health care provider. Make sure you discuss any questions you have with your health care provider.

## 2014-03-16 NOTE — ED Provider Notes (Signed)
CSN: [id number]     Arrival date & time 03/16/14  1254 History   First MD Initiated Contact with Patient 03/16/14 1303     Chief Complaint  Patient presents with  . Lip Laceration     (Consider location/radiation/quality/duration/timing/severity/associated sxs/prior Treatment) HPI Comments: Pt states that he tripped and fell. Denies dizziness or loc associated with fall. States that he is hurting in his right shoulder. He states that he cut his bottom lip and the area is continuing to bleed. Not on any blood thinner. Was able to get up and ambulated immediately after the fall.  The history is provided by the patient. No language interpreter was used.    Past Medical History  Diagnosis Date  . Hypertension   . CVA (cerebral vascular accident) 07/17/2013   Past Surgical History  Procedure Laterality Date  . Cardiac defibrillator placement    . Lung surgery    . Tendon repair    . Rotator cuff repair Left    History reviewed. No pertinent family history. History  Substance Use Topics  . Smoking status: Former Research scientist (life sciences)  . Smokeless tobacco: Not on file  . Alcohol Use: No    Review of Systems  All other systems reviewed and are negative.     Allergies  Review of patient's allergies indicates no known allergies.  Home Medications   Prior to Admission medications   Medication Sig Start Date End Date Taking? Authorizing Provider  amLODipine-olmesartan (AZOR) 10-40 MG per tablet Take 1 tablet by mouth daily.   Yes Historical Provider, MD  aspirin 325 MG tablet Take 325 mg by mouth daily.   Yes Historical Provider, MD  FLUoxetine (PROZAC) 20 MG capsule Take 20 mg by mouth daily.   Yes Historical Provider, MD  hydrochlorothiazide (HYDRODIURIL) 25 MG tablet Take 25 mg by mouth daily.   Yes Historical Provider, MD  ibuprofen (ADVIL,MOTRIN) 200 MG tablet Take 200 mg by mouth every 6 (six) hours as needed for moderate pain. For pain   Yes Historical Provider, MD  latanoprost  (XALATAN) 0.005 % ophthalmic solution Place 1 drop into both eyes at bedtime.   Yes Historical Provider, MD  metoprolol succinate (TOPROL XL) 100 MG 24 hr tablet Take 50 mg by mouth daily.    Yes Historical Provider, MD  omeprazole (PRILOSEC) 20 MG capsule Take 20 mg by mouth daily.   Yes Historical Provider, MD  oxyCODONE (OXY IR/ROXICODONE) 5 MG immediate release tablet Take 5 mg by mouth every 6 (six) hours as needed for moderate pain or severe pain.   Yes Historical Provider, MD  rosuvastatin (CRESTOR) 10 MG tablet Take 10 mg by mouth daily.   Yes Historical Provider, MD  traMADol (ULTRAM) 50 MG tablet Take 50 mg by mouth every 6 (six) hours as needed. For pain   Yes Historical Provider, MD  traZODone (DESYREL) 50 MG tablet Take 50 mg by mouth at bedtime.   Yes Historical Provider, MD   BP 124/72  Pulse 60  Temp(Src) 98.2 F (36.8 C) (Oral)  Resp 16  SpO2 100% Physical Exam  Nursing note and vitals reviewed. Constitutional: He appears well-developed and well-nourished.  HENT:  Right Ear: External ear normal.  Left Ear: External ear normal.  Mouth/Throat: Oropharynx is clear and moist.  2 cm Laceration noted to the inside lower EUM:PNTIRWE and closing mouth without any problem:no loose dentition noted  Eyes: Conjunctivae and EOM are normal. Pupils are equal, round, and reactive to light.  Neck: Neck supple.  Cardiovascular: Normal rate and regular rhythm.   Pulmonary/Chest: Effort normal and breath sounds normal.  Musculoskeletal:       Cervical back: Normal.       Thoracic back: Normal.       Lumbar back: Normal.  Tender to the right lateral shoulder. Pt has full rom. No gross deformity noted. Left shoulder in brace from recent rotator cuff surgery    ED Course  Procedures (including critical care time) Labs Review Labs Reviewed - No data to display  Imaging Review Dg Shoulder Right  03/16/2014   CLINICAL DATA:  68 year old male status post fall with right shoulder pain.  Tripped and fell. Initial encounter.  EXAM: RIGHT SHOULDER - 2+ VIEW  COMPARISON:  None.  FINDINGS: No glenohumeral joint dislocation. Bone mineralization is within normal limits for age. Proximal right humerus intact. Visible right ribs intact. Partially visible transvenous AICD lead. Scapula appears intact.  IMPRESSION: No acute fracture or dislocation identified about the right shoulder.   Electronically Signed   By: Lars Pinks M.D.   On: 03/16/2014 13:28     EKG Interpretation None      MDM   Final diagnoses:  Lip laceration, initial encounter  Right shoulder pain  Fall, initial encounter    No acute bony abnormality noted to the right shoulder. Neurologically intact. Given amoxicillin to prevent any oral infection. Wound doesn't need to be sutured. Discussed signs of infection with pt    Glendell Docker, NP 03/16/14 1346

## 2014-03-16 NOTE — ED Notes (Signed)
Pt reports tripped and fell and bit his lip and landed on right shoulder.pt denies hitting head or loc. Moderate size laceration noted to bottom lip. No active bleeding noted. Pt denies being on any blood thinners. nad noted.

## 2014-03-17 NOTE — ED Provider Notes (Signed)
Medical screening examination/treatment/procedure(s) were performed by non-physician practitioner and as supervising physician I was immediately available for consultation/collaboration.   EKG Interpretation None       Nat Christen, MD 03/17/14 1047

## 2014-08-30 ENCOUNTER — Encounter: Payer: Self-pay | Admitting: Family Medicine

## 2015-03-07 DIAGNOSIS — R21 Rash and other nonspecific skin eruption: Secondary | ICD-10-CM | POA: Diagnosis not present

## 2015-03-11 DIAGNOSIS — T7840XA Allergy, unspecified, initial encounter: Secondary | ICD-10-CM | POA: Diagnosis not present

## 2015-03-11 DIAGNOSIS — R21 Rash and other nonspecific skin eruption: Secondary | ICD-10-CM | POA: Diagnosis not present

## 2015-04-09 DIAGNOSIS — I252 Old myocardial infarction: Secondary | ICD-10-CM | POA: Diagnosis not present

## 2015-04-09 DIAGNOSIS — H409 Unspecified glaucoma: Secondary | ICD-10-CM | POA: Diagnosis not present

## 2015-04-09 DIAGNOSIS — K219 Gastro-esophageal reflux disease without esophagitis: Secondary | ICD-10-CM | POA: Diagnosis not present

## 2015-04-09 DIAGNOSIS — G4700 Insomnia, unspecified: Secondary | ICD-10-CM | POA: Diagnosis not present

## 2015-04-09 DIAGNOSIS — Z6836 Body mass index (BMI) 36.0-36.9, adult: Secondary | ICD-10-CM | POA: Diagnosis not present

## 2015-04-09 DIAGNOSIS — I10 Essential (primary) hypertension: Secondary | ICD-10-CM | POA: Diagnosis not present

## 2015-04-09 DIAGNOSIS — E785 Hyperlipidemia, unspecified: Secondary | ICD-10-CM | POA: Diagnosis not present

## 2015-04-09 DIAGNOSIS — G4733 Obstructive sleep apnea (adult) (pediatric): Secondary | ICD-10-CM | POA: Diagnosis not present

## 2015-04-09 DIAGNOSIS — E669 Obesity, unspecified: Secondary | ICD-10-CM | POA: Diagnosis not present

## 2015-10-25 DIAGNOSIS — I10 Essential (primary) hypertension: Secondary | ICD-10-CM | POA: Diagnosis not present

## 2017-03-09 ENCOUNTER — Other Ambulatory Visit (HOSPITAL_COMMUNITY): Payer: Self-pay | Admitting: Pulmonary Disease

## 2017-03-09 ENCOUNTER — Ambulatory Visit (HOSPITAL_COMMUNITY)
Admission: RE | Admit: 2017-03-09 | Discharge: 2017-03-09 | Disposition: A | Discharge status: Home / Self Care | Payer: Medicare PPO | Source: Ambulatory Visit | Attending: Pulmonary Disease | Admitting: Pulmonary Disease

## 2017-03-09 DIAGNOSIS — R52 Pain, unspecified: Secondary | ICD-10-CM

## 2017-03-09 DIAGNOSIS — M25552 Pain in left hip: Secondary | ICD-10-CM | POA: Insufficient documentation

## 2017-03-09 DIAGNOSIS — M160 Bilateral primary osteoarthritis of hip: Secondary | ICD-10-CM | POA: Diagnosis not present

## 2017-03-10 ENCOUNTER — Ambulatory Visit (HOSPITAL_COMMUNITY)
Admission: RE | Admit: 2017-03-10 | Discharge: 2017-03-10 | Disposition: A | Discharge status: Home / Self Care | Payer: Medicare PPO | Source: Ambulatory Visit | Attending: Pulmonary Disease | Admitting: Pulmonary Disease

## 2017-03-10 ENCOUNTER — Other Ambulatory Visit (HOSPITAL_COMMUNITY): Payer: Self-pay | Admitting: Pulmonary Disease

## 2017-03-10 DIAGNOSIS — M4316 Spondylolisthesis, lumbar region: Secondary | ICD-10-CM | POA: Diagnosis not present

## 2017-03-10 DIAGNOSIS — R52 Pain, unspecified: Secondary | ICD-10-CM

## 2017-03-10 DIAGNOSIS — M545 Low back pain: Secondary | ICD-10-CM | POA: Insufficient documentation

## 2017-07-28 DIAGNOSIS — M545 Low back pain: Secondary | ICD-10-CM | POA: Diagnosis not present

## 2017-08-04 ENCOUNTER — Other Ambulatory Visit (HOSPITAL_COMMUNITY): Payer: Self-pay | Admitting: Pulmonary Disease

## 2017-08-04 ENCOUNTER — Ambulatory Visit (HOSPITAL_COMMUNITY)
Admission: RE | Admit: 2017-08-04 | Discharge: 2017-08-04 | Disposition: A | Discharge status: Home / Self Care | Payer: Medicare PPO | Source: Ambulatory Visit | Attending: Pulmonary Disease | Admitting: Pulmonary Disease

## 2017-08-04 DIAGNOSIS — M5127 Other intervertebral disc displacement, lumbosacral region: Secondary | ICD-10-CM | POA: Insufficient documentation

## 2017-08-04 DIAGNOSIS — M4807 Spinal stenosis, lumbosacral region: Secondary | ICD-10-CM | POA: Insufficient documentation

## 2017-08-04 DIAGNOSIS — M5126 Other intervertebral disc displacement, lumbar region: Secondary | ICD-10-CM | POA: Insufficient documentation

## 2017-08-04 DIAGNOSIS — M545 Low back pain: Secondary | ICD-10-CM

## 2017-08-04 DIAGNOSIS — M48061 Spinal stenosis, lumbar region without neurogenic claudication: Secondary | ICD-10-CM | POA: Diagnosis not present

## 2017-08-04 DIAGNOSIS — I700 Atherosclerosis of aorta: Secondary | ICD-10-CM | POA: Diagnosis not present

## 2017-08-04 DIAGNOSIS — M4316 Spondylolisthesis, lumbar region: Secondary | ICD-10-CM | POA: Insufficient documentation

## 2017-08-04 LAB — POCT I-STAT CREATININE: CREATININE: 1.6 mg/dL — AB (ref 0.61–1.24)

## 2017-08-04 MED ORDER — IOPAMIDOL (ISOVUE-300) INJECTION 61%
75.0000 mL | Freq: Once | INTRAVENOUS | Status: AC | PRN
  Administered 2017-08-04: 75 mL via INTRAVENOUS

## 2017-08-05 ENCOUNTER — Other Ambulatory Visit: Payer: Self-pay | Admitting: Pulmonary Disease

## 2017-08-05 ENCOUNTER — Ambulatory Visit (HOSPITAL_COMMUNITY): Admission: RE | Admit: 2017-08-05 | Payer: Non-veteran care | Source: Ambulatory Visit

## 2017-08-05 DIAGNOSIS — M545 Low back pain: Secondary | ICD-10-CM

## 2017-08-11 ENCOUNTER — Ambulatory Visit
Admission: RE | Admit: 2017-08-11 | Discharge: 2017-08-11 | Disposition: A | Discharge status: Home / Self Care | Payer: Non-veteran care | Source: Ambulatory Visit | Attending: Pulmonary Disease | Admitting: Pulmonary Disease

## 2017-08-11 DIAGNOSIS — M545 Low back pain: Secondary | ICD-10-CM

## 2017-08-11 DIAGNOSIS — M48061 Spinal stenosis, lumbar region without neurogenic claudication: Secondary | ICD-10-CM | POA: Diagnosis not present

## 2017-08-11 MED ORDER — IOPAMIDOL (ISOVUE-M 200) INJECTION 41%
1.0000 mL | Freq: Once | INTRAMUSCULAR | Status: AC
  Administered 2017-08-11: 1 mL via EPIDURAL

## 2017-08-11 MED ORDER — METHYLPREDNISOLONE ACETATE 40 MG/ML INJ SUSP (RADIOLOG
120.0000 mg | Freq: Once | INTRAMUSCULAR | Status: AC
  Administered 2017-08-11: 120 mg via EPIDURAL

## 2017-08-11 NOTE — Discharge Instructions (Signed)

## 2018-03-15 ENCOUNTER — Emergency Department (HOSPITAL_COMMUNITY): Payer: Non-veteran care

## 2018-03-15 ENCOUNTER — Other Ambulatory Visit: Payer: Self-pay

## 2018-03-15 ENCOUNTER — Emergency Department (HOSPITAL_COMMUNITY)
Admission: EM | Admit: 2018-03-15 | Discharge: 2018-03-15 | Disposition: A | Discharge status: Home / Self Care | Payer: Non-veteran care | Attending: Emergency Medicine | Admitting: Emergency Medicine

## 2018-03-15 ENCOUNTER — Encounter (HOSPITAL_COMMUNITY): Payer: Self-pay

## 2018-03-15 DIAGNOSIS — Z7982 Long term (current) use of aspirin: Secondary | ICD-10-CM | POA: Diagnosis not present

## 2018-03-15 DIAGNOSIS — S62521B Displaced fracture of distal phalanx of right thumb, initial encounter for open fracture: Secondary | ICD-10-CM | POA: Diagnosis not present

## 2018-03-15 DIAGNOSIS — I10 Essential (primary) hypertension: Secondary | ICD-10-CM | POA: Diagnosis not present

## 2018-03-15 DIAGNOSIS — Z79899 Other long term (current) drug therapy: Secondary | ICD-10-CM | POA: Diagnosis not present

## 2018-03-15 DIAGNOSIS — Y93E9 Activity, other interior property and clothing maintenance: Secondary | ICD-10-CM | POA: Diagnosis not present

## 2018-03-15 DIAGNOSIS — Y929 Unspecified place or not applicable: Secondary | ICD-10-CM | POA: Insufficient documentation

## 2018-03-15 DIAGNOSIS — Y990 Civilian activity done for income or pay: Secondary | ICD-10-CM | POA: Diagnosis not present

## 2018-03-15 DIAGNOSIS — W312XXA Contact with powered woodworking and forming machines, initial encounter: Secondary | ICD-10-CM | POA: Insufficient documentation

## 2018-03-15 DIAGNOSIS — Z87891 Personal history of nicotine dependence: Secondary | ICD-10-CM | POA: Diagnosis not present

## 2018-03-15 MED ORDER — HYDROCODONE-ACETAMINOPHEN 5-325 MG PO TABS: 1.0000 | ORAL_TABLET | ORAL | 0 refills | Status: DC | PRN

## 2018-03-15 MED ORDER — LIDOCAINE HCL (PF) 1 % IJ SOLN
INTRAMUSCULAR | Status: AC
  Administered 2018-03-15: 18:00:00
  Filled 2018-03-15: qty 10

## 2018-03-15 MED ORDER — ONDANSETRON HCL 4 MG PO TABS
4.0000 mg | ORAL_TABLET | Freq: Once | ORAL | Status: AC
  Administered 2018-03-15: 4 mg via ORAL
  Filled 2018-03-15: qty 1

## 2018-03-15 MED ORDER — DOXYCYCLINE HYCLATE 100 MG PO CAPS: 100.0000 mg | ORAL_CAPSULE | Freq: Two times a day (BID) | ORAL | 0 refills | Status: DC

## 2018-03-15 MED ORDER — HYDROCODONE-ACETAMINOPHEN 5-325 MG PO TABS
2.0000 | ORAL_TABLET | Freq: Once | ORAL | Status: AC
  Administered 2018-03-15: 2 via ORAL
  Filled 2018-03-15: qty 2

## 2018-03-15 NOTE — Discharge Instructions (Signed)
Your vital signs are within normal limits.  Your x-ray shows evidence of an open fracture of the right thumb.  Your wound has been repaired with 10 stitches.  The should come out in 7 or 8 days.  Please see your physician at the [facility], or return to the emergency department if any increased redness, increased swelling, red streaks going up the hand, or signs of advancing infection.  Please keep your hand elevated as much as possible.  Keep your dressing and your hand clean and dry.  Use Tylenol extra strength for mild pain, use Norco for more severe pain.  Please use doxycycline 2 times daily with food until all taken.  Change your dressing daily. Use splint until seen by orthopedic MD.

## 2018-03-15 NOTE — ED Provider Notes (Signed)
[facility] EMERGENCY DEPARTMENT Provider Note   CSN: [id number] Arrival date & time: 03/15/18  1337     History   Chief Complaint Chief Complaint  Patient presents with  . Laceration    HPI Luis Donovan is a 72 y.o. male.  Patient is a 72 year old male who presents to the emergency department with injury to the right thumb.  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  He accidentally cut his thumb on the right hand.  The patient was able to control the bleeding by applying pressure.  He reports that he is not on any anticoagulation medications, and has no history of bleeding disorders.  No other injury reported at this time.  Patient states that he is up-to-date on his tetanus.  The history is provided by the patient.    Past Medical History:  Diagnosis Date  . CVA (cerebral vascular accident) ([place]) 07/17/2013  . Hypertension     Patient Active Problem List   Diagnosis Date Noted  . CVA (cerebral vascular accident) ([place]) 07/17/2013    Past Surgical History:  Procedure Laterality Date  . CARDIAC DEFIBRILLATOR PLACEMENT    . LUNG SURGERY    . ROTATOR CUFF REPAIR Left   . TENDON REPAIR          Home Medications    Prior to Admission medications   Medication Sig Start Date End Date Taking? Authorizing Provider  amLODipine-olmesartan (AZOR) 10-40 MG per tablet Take 1 tablet by mouth daily.    [provider]  amoxicillin (AMOXIL) 500 MG capsule Take 1 capsule (500 mg total) by mouth 2 (two) times daily. 03/16/14   Glendell Docker, NP  aspirin 325 MG tablet Take 325 mg by mouth daily.    [provider]  FLUoxetine (PROZAC) 20 MG capsule Take 20 mg by mouth daily.    [provider]  hydrochlorothiazide (HYDRODIURIL) 25 MG tablet Take 25 mg by mouth daily.    [provider]  ibuprofen (ADVIL,MOTRIN) 200 MG tablet Take 200 mg by mouth every 6 (six) hours as needed for moderate  pain. For pain    [provider]  latanoprost (XALATAN) 0.005 % ophthalmic solution Place 1 drop into both eyes at bedtime.    [provider]  metoprolol succinate (TOPROL XL) 100 MG 24 hr tablet Take 50 mg by mouth daily.     [provider]  omeprazole (PRILOSEC) 20 MG capsule Take 20 mg by mouth daily.    [provider]  oxyCODONE (OXY IR/ROXICODONE) 5 MG immediate release tablet Take 5 mg by mouth every 6 (six) hours as needed for moderate pain or severe pain.    [provider]  rosuvastatin (CRESTOR) 10 MG tablet Take 10 mg by mouth daily.    [provider]  traMADol (ULTRAM) 50 MG tablet Take 50 mg by mouth every 6 (six) hours as needed. For pain    [provider]  traZODone (DESYREL) 50 MG tablet Take 50 mg by mouth at bedtime.    [provider]    Family History No family history on file.  Social History Social History   Tobacco Use  . Smoking status: Former Research scientist (life sciences)  . Smokeless tobacco: Never Used  Substance Use Topics  . Alcohol use: No  . Drug use: No     Allergies   Patient has no known allergies.   Review of Systems Review of Systems  Constitutional:  Negative for activity change.       All ROS Neg except as noted in HPI  HENT: Negative for nosebleeds.   Eyes: Negative for photophobia and discharge.  Respiratory: Negative for cough, shortness of breath and wheezing.   Cardiovascular: Negative for chest pain and palpitations.  Gastrointestinal: Negative for abdominal pain and blood in stool.  Genitourinary: Negative for dysuria, frequency and hematuria.  Musculoskeletal: Positive for arthralgias. Negative for back pain and neck pain.  Skin: Negative.   Neurological: Negative for dizziness, seizures and speech difficulty.  Psychiatric/Behavioral: Negative for confusion and hallucinations.     Physical Exam Updated Vital Signs BP (!) 156/83 (BP Location: Left Arm)   Pulse (!) 59    Temp 98.6 F (37 C) (Oral)   Resp 14   Wt 108 kg   SpO2 98%   BMI 36.19 kg/m   Physical Exam  Constitutional: He is oriented to person, place, and time. He appears well-developed and well-nourished.  Non-toxic appearance.  HENT:  Head: Normocephalic.  Right Ear: Tympanic membrane and external ear normal.  Left Ear: Tympanic membrane and external ear normal.  Eyes: Pupils are equal, round, and reactive to light. EOM and lids are normal.  Neck: Normal range of motion. Neck supple. Carotid bruit is not present.  Cardiovascular: Normal rate, regular rhythm, normal heart sounds, intact distal pulses and normal pulses.  Pulmonary/Chest: Breath sounds normal. No respiratory distress.  Abdominal: Soft. Bowel sounds are normal. There is no tenderness. There is no guarding.  Musculoskeletal: Normal range of motion.       Right hand: He exhibits tenderness and laceration. Normal sensation noted. Normal strength noted.  Patient has a laceration from a power saw involving the nail and the distal portion of the right thumb. Laceration measures 5.4cm.  Bleeding actively controlled.  Lymphadenopathy:       Head (right side): No submandibular adenopathy present.       Head (left side): No submandibular adenopathy present.    He has no cervical adenopathy.  Neurological: He is alert and oriented to person, place, and time. He has normal strength. No cranial nerve deficit or sensory deficit.  Skin: Skin is warm and dry.  Psychiatric: He has a normal mood and affect. His speech is normal.  Nursing note and vitals reviewed.    ED Treatments / Results  Labs (all labs ordered are listed, but only abnormal results are displayed) Labs Reviewed - No data to display  EKG None  Radiology No results found.  Procedures FRACTURE CARE RIGHT THUMB .Marland KitchenLaceration Repair Date/Time: 03/15/2018 5:26 PM Performed by: Lily Kocher, PA-C Authorized by: Lily Kocher, PA-C   Consent:    Consent obtained:   Verbal   Consent given by:  Patient   Risks discussed:  Infection, pain, poor cosmetic result and poor wound healing Universal protocol:    Procedure explained and questions answered to patient or proxy's satisfaction: yes     Immediately prior to procedure, a time out was called: yes     Patient identity confirmed:  Arm band Anesthesia (see MAR for exact dosages):    Anesthesia method:  Nerve block   Block needle gauge:  25 G   Block anesthetic:  Lidocaine 1% w/o epi   Block technique:  Digital   Block injection procedure:  Anatomic landmarks identified, introduced needle, incremental injection and negative aspiration for blood   Block outcome:  Anesthesia achieved Laceration details:    Location:  Finger   Finger location:  R thumb   Length (cm):  5.4 Repair type:    Repair type:  Simple Pre-procedure details:    Preparation:  Patient was prepped and draped in usual sterile fashion Exploration:    Hemostasis achieved with:  Direct pressure   Wound exploration: wound explored through full range of motion     Wound extent: underlying fracture     Wound extent: no foreign bodies/material noted, no nerve damage noted and no tendon damage noted   Treatment:    Area cleansed with:  Betadine   Amount of cleaning:  Extensive   Irrigation solution:  Sterile saline Skin repair:    Repair method:  Sutures   Suture size:  4-0   Suture material:  Nylon   Suture technique:  Simple interrupted   Number of sutures:  10 Approximation:    Approximation:  Close Post-procedure details:    Dressing:  Sterile dressing and splint for protection   Patient tolerance of procedure:  Tolerated well, no immediate complications Comments:     After the wound was repaired and the sutures were placed, a aluminum finger splint was applied due to open fracture.  After the application of the finger splint, there are no temperature changes, no complaint of the splint being too tight.  The patient tolerated  the splint procedure without problem.  Patient treated with hydrocodone for pain.   (including critical care time)  Medications Ordered in ED Medications - No data to display   Initial Impression / Assessment and Plan / ED Course  I have reviewed the triage vital signs and the nursing notes.  Pertinent labs & imaging results that were available during my care of the patient were reviewed by me and considered in my medical decision making (see chart for details).      Final Clinical Impressions(s) / ED Diagnoses mdm  Patient sustained an open fracture as result of a power saw today.  The wound was irrigated thoroughly.  The area was repaired with 10 interrupted sutures of nylon.  Finger splint was applied.  Patient placed on antibiotics and pain medication.  Patient to follow-up with Dr. Aline Brochure, or the [facility] orthopedic team.   Final diagnoses:  Open displaced fracture of distal phalanx of right thumb, initial encounter    ED Discharge Orders    None       Lily Kocher, PA-C 03/16/18 1208    Isla Pence, MD 03/17/18 1520

## 2018-03-15 NOTE — ED Triage Notes (Signed)
Patient states he cut his right thumb on a saw today. Patient has approximately 2 inch laceration to right thumb. Bleeding controlled at this time.

## 2018-09-12 DIAGNOSIS — I10 Essential (primary) hypertension: Secondary | ICD-10-CM | POA: Diagnosis not present

## 2018-09-12 DIAGNOSIS — F4310 Post-traumatic stress disorder, unspecified: Secondary | ICD-10-CM | POA: Diagnosis not present

## 2018-09-12 DIAGNOSIS — G4733 Obstructive sleep apnea (adult) (pediatric): Secondary | ICD-10-CM | POA: Diagnosis not present

## 2018-09-12 DIAGNOSIS — M545 Low back pain: Secondary | ICD-10-CM | POA: Diagnosis not present

## 2019-02-16 DIAGNOSIS — Z23 Encounter for immunization: Secondary | ICD-10-CM | POA: Diagnosis not present

## 2019-04-16 DIAGNOSIS — N5201 Erectile dysfunction due to arterial insufficiency: Secondary | ICD-10-CM

## 2019-04-16 DIAGNOSIS — F4310 Post-traumatic stress disorder, unspecified: Secondary | ICD-10-CM

## 2019-04-16 DIAGNOSIS — I10 Essential (primary) hypertension: Secondary | ICD-10-CM

## 2019-04-16 DIAGNOSIS — E785 Hyperlipidemia, unspecified: Secondary | ICD-10-CM

## 2019-04-16 DIAGNOSIS — E782 Mixed hyperlipidemia: Secondary | ICD-10-CM | POA: Insufficient documentation

## 2019-04-16 DIAGNOSIS — K2100 Gastro-esophageal reflux disease with esophagitis, without bleeding: Secondary | ICD-10-CM | POA: Insufficient documentation

## 2019-04-16 DIAGNOSIS — E669 Obesity, unspecified: Secondary | ICD-10-CM

## 2019-04-16 DIAGNOSIS — G4730 Sleep apnea, unspecified: Secondary | ICD-10-CM | POA: Insufficient documentation

## 2019-09-07 ENCOUNTER — Ambulatory Visit: Payer: Non-veteran care | Admitting: Family Medicine

## 2020-12-07 ENCOUNTER — Encounter: Payer: Self-pay | Admitting: Emergency Medicine

## 2020-12-07 ENCOUNTER — Other Ambulatory Visit: Payer: Self-pay

## 2020-12-07 ENCOUNTER — Ambulatory Visit
Admission: EM | Admit: 2020-12-07 | Discharge: 2020-12-07 | Disposition: A | Discharge status: Home / Self Care | Payer: Non-veteran care | Attending: Family Medicine | Admitting: Family Medicine

## 2020-12-07 DIAGNOSIS — U071 COVID-19: Secondary | ICD-10-CM

## 2020-12-07 MED ORDER — NIRMATRELVIR/RITONAVIR (PAXLOVID)TABLET: 3.0000 | ORAL_TABLET | Freq: Two times a day (BID) | ORAL | 0 refills | Status: AC

## 2020-12-07 MED ORDER — PREDNISONE 20 MG PO TABS: 40.0000 mg | ORAL_TABLET | Freq: Every day | ORAL | 0 refills | Status: AC

## 2020-12-07 MED ORDER — PROMETHAZINE-DM 6.25-15 MG/5ML PO SYRP: 5.0000 mL | ORAL_SOLUTION | Freq: Four times a day (QID) | ORAL | 0 refills | Status: DC | PRN

## 2020-12-07 NOTE — ED Triage Notes (Signed)
Cough, scratchy throat, nasal congestion.  Took home covid test today with a positive result.

## 2020-12-07 NOTE — ED Provider Notes (Signed)
[facility]    CSN: [id number] Arrival date & time: 12/07/20  1524      History   Chief Complaint No chief complaint on file.   HPI Luis Donovan is a 75 y.o. male.   HPI Patient here for evaluation after testing positive for COVID with a home COVID test today.  His current symptoms include cough, scratchy throat and nasal congestion.  His symptoms have been present for the last 2 to 3 days.  He is unaware of any known exposure to anyone positive for COVID.  He is fully vaccinated and boosted. Past Medical History:  Diagnosis Date   Anemia    CVA (cerebral vascular accident) ([place]) 07/17/2013   Empyema ([place])    Erectile dysfunction due to arterial insufficiency    Essential (primary) hypertension    Gastro-esophageal reflux disease with esophagitis    Head injury    Heart disease    Hyperlipidemia, unspecified    Hypertension    Obesity, unspecified    Pneumonia    Post-traumatic stress disorder, unspecified    Sleep apnea, unspecified     Patient Active Problem List   Diagnosis Date Noted   Erectile dysfunction due to arterial insufficiency    Essential (primary) hypertension    Gastro-esophageal reflux disease with esophagitis    Hyperlipidemia, unspecified    Obesity, unspecified    Post-traumatic stress disorder, unspecified    Sleep apnea, unspecified    CVA (cerebral vascular accident) ([place]) 07/17/2013    Past Surgical History:  Procedure Laterality Date   CARDIAC DEFIBRILLATOR PLACEMENT     LUNG SURGERY     operation on skin of neck     ROTATOR CUFF REPAIR Left    TENDON REPAIR     VIDEO ASSISTED THORACOSCOPY         Home Medications    Prior to Admission medications   Medication Sig Start Date End Date Taking? Authorizing Provider  nirmatrelvir/ritonavir EUA (PAXLOVID) TABS Take 3 tablets by mouth 2 (two) times daily for 5 days. Patient GFR is 50 Take nirmatrelvir (150 mg) two tablets twice daily for 5 days and ritonavir (100 mg) one  tablet twice daily for 5 days. 12/07/20 12/12/20 Yes Scot Jun, FNP  predniSONE (DELTASONE) 20 MG tablet Take 2 tablets (40 mg total) by mouth daily with breakfast for 5 days. 12/07/20 12/12/20 Yes Scot Jun, FNP  promethazine-dextromethorphan (PROMETHAZINE-DM) 6.25-15 MG/5ML syrup Take 5 mLs by mouth 4 (four) times daily as needed for cough. 12/07/20  Yes Scot Jun, FNP  amLODipine-olmesartan (AZOR) 10-40 MG per tablet Take 1 tablet by mouth daily.    [provider]  amoxicillin (AMOXIL) 500 MG capsule Take 1 capsule (500 mg total) by mouth 2 (two) times daily. 03/16/14   Glendell Docker, NP  aspirin 325 MG tablet Take 325 mg by mouth daily.    [provider]  doxycycline (VIBRAMYCIN) 100 MG capsule Take 1 capsule (100 mg total) by mouth 2 (two) times daily. 03/15/18   Lily Kocher, PA-C  FLUoxetine (PROZAC) 20 MG capsule Take 20 mg by mouth daily.    [provider]  hydrochlorothiazide (HYDRODIURIL) 25 MG tablet Take 25 mg by mouth daily.    [provider]  HYDROcodone-acetaminophen (NORCO/VICODIN) 5-325 MG tablet Take 1-2 tablets by mouth every 4 (four) hours as needed. 03/15/18   Lily Kocher, PA-C  ibuprofen (ADVIL,MOTRIN) 200 MG tablet Take 200 mg by mouth every 6 (six) hours as needed for moderate  pain. For pain    [provider]  latanoprost (XALATAN) 0.005 % ophthalmic solution Place 1 drop into both eyes at bedtime.    [provider]  metoprolol succinate (TOPROL XL) 100 MG 24 hr tablet Take 50 mg by mouth daily.     [provider]  omeprazole (PRILOSEC) 20 MG capsule Take 20 mg by mouth daily.    [provider]  oxyCODONE (OXY IR/ROXICODONE) 5 MG immediate release tablet Take 5 mg by mouth every 6 (six) hours as needed for moderate pain or severe pain.    [provider]  rosuvastatin (CRESTOR) 10 MG tablet Take 10 mg by mouth daily.    [provider]  traMADol  (ULTRAM) 50 MG tablet Take 50 mg by mouth every 6 (six) hours as needed. For pain    [provider]  traZODone (DESYREL) 50 MG tablet Take 50 mg by mouth at bedtime.    [provider]    Family History History reviewed. No pertinent family history.  Social History Social History   Tobacco Use   Smoking status: Former   Smokeless tobacco: Never  Substance Use Topics   Alcohol use: No   Drug use: No     Allergies   Patient has no known allergies.   Review of Systems Review of Systems Pertinent negatives listed in HPI   Physical Exam Triage Vital Signs ED Triage Vitals  Enc Vitals Group     BP 12/07/20 1615 136/77     Pulse Rate 12/07/20 1615 67     Resp 12/07/20 1615 16     Temp 12/07/20 1615 98.9 F (37.2 C)     Temp Source 12/07/20 1615 Oral     SpO2 12/07/20 1615 96 %     Weight --      Height --      Head Circumference --      Peak Flow --      Pain Score 12/07/20 1616 0     Pain Loc --      Pain Edu? --      Excl. in [place]? --    No data found.  Updated Vital Signs BP 136/77 (BP Location: Right Arm)   Pulse 67   Temp 98.9 F (37.2 C) (Oral)   Resp 16   SpO2 96%   Visual Acuity Right Eye Distance:   Left Eye Distance:   Bilateral Distance:    Right Eye Near:   Left Eye Near:    Bilateral Near:     Physical Exam  General Appearance:    Alert, cooperative, no distress  HENT:   Normocephalic, ears normal, nares mucosal edema with congestion, rhinorrhea, oropharynx    Eyes:    PERRL, conjunctiva/corneas clear, EOM's intact       Lungs:     Clear to auscultation bilaterally, respirations unlabored  Heart:    Regular rate and rhythm  Neurologic:   Awake, alert, oriented x 3. No apparent focal neurological deficit      UC Treatments / Results  Labs (all labs ordered are listed, but only abnormal results are displayed) Labs Reviewed - No data to display  EKG   Radiology No results found.  Procedures Procedures  (including critical care time)  Medications Ordered in UC Medications - No data to display  Initial Impression / Assessment and Plan / UC Course  I have reviewed the triage vital signs and the nursing notes.  Pertinent labs & imaging results  that were available during my care of the patient were reviewed by me and considered in my medical decision making (see chart for details).    COVID-19 viral infection confirmed with home test.  Treating the patient today with Paxlovid antiviral therapy.  Reviewed recent labs at the [place] in [place] GFR greater than 50.  Symptomatic management per discharge medication orders.  Strict ER precautions given if symptoms worsen or do not improve. Final Clinical Impressions(s) / UC Diagnoses   Final diagnoses:  COVID-19 virus infection   Discharge Instructions   None    ED Prescriptions     Medication Sig Dispense Auth. Provider   nirmatrelvir/ritonavir EUA (PAXLOVID) TABS Take 3 tablets by mouth 2 (two) times daily for 5 days. Patient GFR is 50 Take nirmatrelvir (150 mg) two tablets twice daily for 5 days and ritonavir (100 mg) one tablet twice daily for 5 days. 30 tablet Scot Jun, FNP   promethazine-dextromethorphan (PROMETHAZINE-DM) 6.25-15 MG/5ML syrup Take 5 mLs by mouth 4 (four) times daily as needed for cough. 118 mL Scot Jun, FNP   predniSONE (DELTASONE) 20 MG tablet Take 2 tablets (40 mg total) by mouth daily with breakfast for 5 days. 10 tablet Scot Jun, FNP      PDMP not reviewed this encounter.   Scot Jun, [place] 12/09/20 2033

## 2022-10-28 ENCOUNTER — Encounter (HOSPITAL_COMMUNITY): Payer: Self-pay

## 2022-10-28 ENCOUNTER — Other Ambulatory Visit: Payer: Self-pay

## 2022-10-28 ENCOUNTER — Emergency Department (HOSPITAL_COMMUNITY): Payer: No Typology Code available for payment source

## 2022-10-28 ENCOUNTER — Emergency Department (HOSPITAL_COMMUNITY)
Admission: EM | Admit: 2022-10-28 | Discharge: 2022-10-29 | Disposition: A | Discharge status: Home / Self Care | Payer: No Typology Code available for payment source | Attending: Emergency Medicine | Admitting: Emergency Medicine

## 2022-10-28 DIAGNOSIS — I110 Hypertensive heart disease with heart failure: Secondary | ICD-10-CM | POA: Diagnosis not present

## 2022-10-28 DIAGNOSIS — I509 Heart failure, unspecified: Secondary | ICD-10-CM | POA: Diagnosis not present

## 2022-10-28 DIAGNOSIS — R0602 Shortness of breath: Secondary | ICD-10-CM | POA: Diagnosis present

## 2022-10-28 DIAGNOSIS — Z87891 Personal history of nicotine dependence: Secondary | ICD-10-CM | POA: Insufficient documentation

## 2022-10-28 DIAGNOSIS — Z8673 Personal history of transient ischemic attack (TIA), and cerebral infarction without residual deficits: Secondary | ICD-10-CM | POA: Diagnosis not present

## 2022-10-28 DIAGNOSIS — R778 Other specified abnormalities of plasma proteins: Secondary | ICD-10-CM | POA: Insufficient documentation

## 2022-10-28 DIAGNOSIS — R790 Abnormal level of blood mineral: Secondary | ICD-10-CM | POA: Diagnosis not present

## 2022-10-28 LAB — BASIC METABOLIC PANEL
Anion gap: 11 (ref 5–15)
BUN: 34 mg/dL — ABNORMAL HIGH (ref 8–23)
CO2: 28 mmol/L (ref 22–32)
Calcium: 9.3 mg/dL (ref 8.9–10.3)
Chloride: 96 mmol/L — ABNORMAL LOW (ref 98–111)
Creatinine, Ser: 1.36 mg/dL — ABNORMAL HIGH (ref 0.61–1.24)
GFR, Estimated: 55 mL/min — ABNORMAL LOW (ref >60)
Glucose, Bld: 136 mg/dL — ABNORMAL HIGH (ref 70–99)
Potassium: 4 mmol/L (ref 3.5–5.1)
Sodium: 135 mmol/L (ref 135–145)

## 2022-10-28 LAB — CBC
HCT: 37.8 % — ABNORMAL LOW (ref 39.0–52.0)
Hemoglobin: 12.5 g/dL — ABNORMAL LOW (ref 13.0–17.0)
MCH: 31.6 pg (ref 26.0–34.0)
MCHC: 33.1 g/dL (ref 30.0–36.0)
MCV: 95.7 fL (ref 80.0–100.0)
Platelets: 297 10*3/uL (ref 150–400)
RBC: 3.95 MIL/uL — ABNORMAL LOW (ref 4.22–5.81)
RDW: 12.7 % (ref 11.5–15.5)
WBC: 10 10*3/uL (ref 4.0–10.5)
nRBC: 0 % (ref 0.0–0.2)

## 2022-10-28 LAB — BRAIN NATRIURETIC PEPTIDE: B Natriuretic Peptide: 498 pg/mL — ABNORMAL HIGH (ref 0.0–100.0)

## 2022-10-28 LAB — TROPONIN I (HIGH SENSITIVITY): Troponin I (High Sensitivity): 24 ng/L — ABNORMAL HIGH (ref <18)

## 2022-10-28 MED ORDER — FUROSEMIDE 10 MG/ML IJ SOLN
60.0000 mg | Freq: Once | INTRAMUSCULAR | Status: AC
  Administered 2022-10-29: 60 mg via INTRAVENOUS
  Filled 2022-10-28: qty 6

## 2022-10-28 NOTE — ED Triage Notes (Signed)
Pt with increased shortness of breath and swelling to both feet and ankles.  [facility] BNP [facility], so they increased his diuretic and told him to come to the ER if it doesn't improve.

## 2022-10-29 LAB — TROPONIN I (HIGH SENSITIVITY): Troponin I (High Sensitivity): 26 ng/L — ABNORMAL HIGH (ref <18)

## 2022-10-29 NOTE — ED Provider Notes (Signed)
[facility] Emergency Department Provider Note MRN:  [id number]  Arrival date & time: 10/29/22     Chief Complaint   Shortness of Breath   History of Present Illness   Luis Donovan is a 77 y.o. year-old male with a history of hypertension, stroke, CHF presenting to the ED with chief complaint of shortness of breath.  Shortness of breath mostly with ambulating, a bit when laying flat over the past few days.  Also some increased lower extremity edema.  Had a BNP checked in the office and it was elevated.  Was told to come to the emergency department if he was having worsening symptoms.  Symptoms worsen, he is here for evaluation.  Having some occasional minimal chest discomfort, not currently present.  No fever or cough.  Review of Systems  A thorough review of systems was obtained and all systems are negative except as noted in the HPI and PMH.   Patient's Health History    Past Medical History:  Diagnosis Date   Anemia    CVA (cerebral vascular accident) ([facility]) 07/17/2013   Empyema ([facility])    Erectile dysfunction due to arterial insufficiency    Essential (primary) hypertension    Gastro-esophageal reflux disease with esophagitis    Head injury    Heart disease    Hyperlipidemia, unspecified    Hypertension    Obesity, unspecified    Pneumonia    Post-traumatic stress disorder, unspecified    Sleep apnea, unspecified     Past Surgical History:  Procedure Laterality Date   CARDIAC DEFIBRILLATOR PLACEMENT     LUNG SURGERY     operation on skin of neck     ROTATOR CUFF REPAIR Left    TENDON REPAIR     VIDEO ASSISTED THORACOSCOPY      History reviewed. No pertinent family history.  Social History   Socioeconomic History   Marital status: Married    Spouse name: beverly   Number of children: 4   Years of education: Not on file   Highest education level: Not on file  Occupational History   Occupation: retired    Associate Professor: RETIRED  Tobacco Use    Smoking status: Former   Smokeless tobacco: Never  Building services engineer Use: Never used  Substance and Sexual Activity   Alcohol use: No   Drug use: No   Sexual activity: Never    Birth control/protection: None  Other Topics Concern   Not on file  Social History Narrative   Not on file   Social Determinants of Health   Financial Resource Strain: Not on file  Food Insecurity: Not on file  Transportation Needs: Not on file  Physical Activity: Not on file  Stress: Not on file  Social Connections: Not on file  Intimate Partner Violence: Not on file     Physical Exam   Vitals:   10/29/22 0035 10/29/22 0200  BP: (!) 204/91 (!) 188/86  Pulse: 75 89  Resp: 18 18  Temp:    SpO2: 97% 93%    CONSTITUTIONAL: Well-appearing, NAD NEURO/PSYCH:  Alert and oriented x 3, no focal deficits EYES:  eyes equal and reactive ENT/NECK:  no LAD, no JVD CARDIO: Regular rate, well-perfused, normal S1 and S2 PULM:  CTAB no wheezing or rhonchi GI/GU:  non-distended, non-tender MSK/SPINE:  No gross deformities, no edema SKIN:  no rash, atraumatic   *Additional and/or pertinent findings included in MDM below  Diagnostic and Interventional Summary  EKG Interpretation  Date/Time:  Wednesday October 28 2022 21:00:19 EDT Ventricular Rate:  69 PR Interval:  146 QRS Duration: 86 QT Interval:  428 QTC Calculation: 458 R Axis:   73 Text Interpretation: Sinus rhythm with occasional Premature ventricular complexes ST & T wave abnormality, consider inferolateral ischemia Abnormal ECG When compared with ECG of 18-Sep-2010 18:48, Premature ventricular complexes are now Present Vent. rate has decreased BY  40 BPM T wave inversion now evident in Inferior leads T wave inversion now evident in Lateral leads Confirmed by Kennis Carina [phone]) on 10/28/2022 11:01:52 PM       Labs Reviewed  BASIC METABOLIC PANEL - Abnormal; Notable for the following components:      Result Value   Chloride 96 (*)     Glucose, Bld 136 (*)    BUN 34 (*)    Creatinine, Ser 1.36 (*)    GFR, Estimated 55 (*)    All other components within normal limits  CBC - Abnormal; Notable for the following components:   RBC 3.95 (*)    Hemoglobin 12.5 (*)    HCT 37.8 (*)    All other components within normal limits  BRAIN NATRIURETIC PEPTIDE - Abnormal; Notable for the following components:   B Natriuretic Peptide 498.0 (*)    All other components within normal limits  TROPONIN I (HIGH SENSITIVITY) - Abnormal; Notable for the following components:   Troponin I (High Sensitivity) 24 (*)    All other components within normal limits  TROPONIN I (HIGH SENSITIVITY) - Abnormal; Notable for the following components:   Troponin I (High Sensitivity) 26 (*)    All other components within normal limits    DG Chest 2 View  Final Result      Medications  furosemide (LASIX) injection 60 mg (60 mg Intravenous Given 10/29/22 0031)     Procedures  /  Critical Care Procedures  ED Course and Medical Decision Making  Initial Impression and Ddx History seems consistent with CHF exacerbation.  He is sitting comfortably in no acute distress with no hypoxia.  Legs have some mild swelling.  Will provide dose of IV Lasix and reassess.  Overall doubt PE, doubt ACS.  Past medical/surgical history that increases complexity of ED encounter: CHF  Interpretation of Diagnostics I personally reviewed the EKG and my interpretation is as follows: Sinus rhythm without concerning ischemic features  Labs reveal elevated BNP, minimally elevated troponin that is flat upon repeat, otherwise no significant blood count or electrolyte disturbance.  Chest x-ray is normal.  Patient Reassessment and Ultimate Disposition/Management     Patient with nearly a liter of urine output after IV Lasix.  He feels more comfortable, normal vitals, appropriate for discharge with close follow-up.  Patient management required discussion with the following  services or consulting groups:  None  Complexity of Problems Addressed Acute illness or injury that poses threat of life of bodily function  Additional Data Reviewed and Analyzed Further history obtained from: Further history from spouse/family member  Additional Factors Impacting ED Encounter Risk Consideration of hospitalization  Elmer Sow. Pilar Plate, MD [facility] Emergency Medicine [facility] [email or web address]  Final Clinical Impressions(s) / ED Diagnoses     ICD-10-CM   1. Acute on chronic congestive heart failure, unspecified heart failure type (HCC)  I50.9       ED Discharge Orders     None        Discharge Instructions Discussed with and Provided to Patient:  Discharge Instructions      You were evaluated in the Emergency Department and after careful evaluation, we did not find any emergent condition requiring admission or further testing in the hospital.  Your exam/testing today is overall reassuring.  Continue your Lasix at home and follow-up with your regular doctors.  Please return to the Emergency Department if you experience any worsening of your condition.   Thank you for allowing [place] to be a part of your care.       Sabas Sous, MD 10/29/22 [phone]

## 2022-10-29 NOTE — Discharge Instructions (Signed)
You were evaluated in the Emergency Department and after careful evaluation, we did not find any emergent condition requiring admission or further testing in the hospital.  Your exam/testing today is overall reassuring.  Continue your Lasix at home and follow-up with your regular doctors.  Please return to the Emergency Department if you experience any worsening of your condition.   Thank you for allowing [place] to be a part of your care.

## 2022-11-02 ENCOUNTER — Encounter (HOSPITAL_COMMUNITY): Payer: Self-pay | Admitting: Emergency Medicine

## 2022-11-02 ENCOUNTER — Emergency Department (HOSPITAL_COMMUNITY)
Admission: EM | Admit: 2022-11-02 | Discharge: 2022-11-03 | Discharge status: Against Medical Advice | Payer: No Typology Code available for payment source | Attending: Emergency Medicine | Admitting: Emergency Medicine

## 2022-11-02 ENCOUNTER — Other Ambulatory Visit: Payer: Self-pay

## 2022-11-02 ENCOUNTER — Emergency Department (HOSPITAL_COMMUNITY): Payer: No Typology Code available for payment source

## 2022-11-02 DIAGNOSIS — Z5321 Procedure and treatment not carried out due to patient leaving prior to being seen by health care provider: Secondary | ICD-10-CM | POA: Diagnosis not present

## 2022-11-02 DIAGNOSIS — R0602 Shortness of breath: Secondary | ICD-10-CM | POA: Diagnosis present

## 2022-11-02 LAB — CBC
HCT: 34.4 % — ABNORMAL LOW (ref 39.0–52.0)
Hemoglobin: 11.4 g/dL — ABNORMAL LOW (ref 13.0–17.0)
MCH: 31.4 pg (ref 26.0–34.0)
MCHC: 33.1 g/dL (ref 30.0–36.0)
MCV: 94.8 fL (ref 80.0–100.0)
Platelets: 310 10*3/uL (ref 150–400)
RBC: 3.63 MIL/uL — ABNORMAL LOW (ref 4.22–5.81)
RDW: 12.5 % (ref 11.5–15.5)
WBC: 7.7 10*3/uL (ref 4.0–10.5)
nRBC: 0 % (ref 0.0–0.2)

## 2022-11-02 LAB — BASIC METABOLIC PANEL
Anion gap: 7 (ref 5–15)
BUN: 35 mg/dL — ABNORMAL HIGH (ref 8–23)
CO2: 30 mmol/L (ref 22–32)
Calcium: 9 mg/dL (ref 8.9–10.3)
Chloride: 96 mmol/L — ABNORMAL LOW (ref 98–111)
Creatinine, Ser: 1.52 mg/dL — ABNORMAL HIGH (ref 0.61–1.24)
GFR, Estimated: 48 mL/min — ABNORMAL LOW (ref >60)
Glucose, Bld: 154 mg/dL — ABNORMAL HIGH (ref 70–99)
Potassium: 3.9 mmol/L (ref 3.5–5.1)
Sodium: 133 mmol/L — ABNORMAL LOW (ref 135–145)

## 2022-11-02 LAB — TROPONIN I (HIGH SENSITIVITY)
Troponin I (High Sensitivity): 19 ng/L — ABNORMAL HIGH (ref <18)
Troponin I (High Sensitivity): 19 ng/L — ABNORMAL HIGH (ref <18)

## 2022-11-02 LAB — BRAIN NATRIURETIC PEPTIDE: B Natriuretic Peptide: 264 pg/mL — ABNORMAL HIGH (ref 0.0–100.0)

## 2022-11-02 MED ORDER — ALBUTEROL SULFATE (2.5 MG/3ML) 0.083% IN NEBU: 3.0000 mL | INHALATION_SOLUTION | RESPIRATORY_TRACT | Status: DC | PRN

## 2022-11-02 NOTE — ED Provider Triage Note (Cosign Needed)
Emergency Medicine Provider Triage Evaluation Note  Luis Donovan , a 77 y.o. male  was evaluated in triage.  Pt complains of shortness of breath.  Started about a week ago.  Worse with exertion.  States his chest feels tight at times.  No chest pain at this time.  Was seen for same a week ago.  States his Lasix is "not helping at home".  States he is urinating but just "not enough".  Also states he is accumulating more fluid around his ankles.  Review of Systems  Positive: See above Negative: See above  Physical Exam  BP (!) 171/76 (BP Location: Right Arm)   Pulse 75   Temp 98.5 F (36.9 C)   Resp 20   Ht 5\' 8"  (1.727 m)   Wt 102.1 kg   SpO2 97%   BMI 34.21 kg/m  Gen:   Awake, no distress   Resp:  Normal effort  MSK:   Moves extremities without difficulty  Other:    Medical Decision Making  Medically screening exam initiated at 4:37 PM.  Appropriate orders placed.  Luis Donovan was informed that the remainder of the evaluation will be completed by another provider, this initial triage assessment does not replace that evaluation, and the importance of remaining in the ED until their evaluation is complete.  Work up started   Gareth Eagle, [place] 11/02/22 1639

## 2022-11-02 NOTE — ED Triage Notes (Signed)
Pt via POV continued SOB since 6/12 unimproved after treatment, with dizziness. Denies CP, nausea, diaphoresis. 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

## 2022-11-03 ENCOUNTER — Encounter (HOSPITAL_COMMUNITY): Payer: Self-pay | Admitting: *Deleted

## 2022-11-03 ENCOUNTER — Emergency Department (HOSPITAL_COMMUNITY): Payer: No Typology Code available for payment source

## 2022-11-03 ENCOUNTER — Other Ambulatory Visit: Payer: Self-pay

## 2022-11-03 ENCOUNTER — Emergency Department (HOSPITAL_COMMUNITY)
Admission: EM | Admit: 2022-11-03 | Discharge: 2022-11-03 | Disposition: A | Discharge status: Home / Self Care | Payer: No Typology Code available for payment source | Attending: Emergency Medicine | Admitting: Emergency Medicine

## 2022-11-03 DIAGNOSIS — Z95810 Presence of automatic (implantable) cardiac defibrillator: Secondary | ICD-10-CM | POA: Diagnosis not present

## 2022-11-03 DIAGNOSIS — Z8673 Personal history of transient ischemic attack (TIA), and cerebral infarction without residual deficits: Secondary | ICD-10-CM | POA: Diagnosis not present

## 2022-11-03 DIAGNOSIS — I509 Heart failure, unspecified: Secondary | ICD-10-CM | POA: Diagnosis not present

## 2022-11-03 DIAGNOSIS — I110 Hypertensive heart disease with heart failure: Secondary | ICD-10-CM | POA: Insufficient documentation

## 2022-11-03 DIAGNOSIS — R0602 Shortness of breath: Secondary | ICD-10-CM | POA: Diagnosis present

## 2022-11-03 DIAGNOSIS — Z79899 Other long term (current) drug therapy: Secondary | ICD-10-CM | POA: Diagnosis not present

## 2022-11-03 LAB — CBC WITH DIFFERENTIAL/PLATELET
Abs Immature Granulocytes: 0.11 10*3/uL — ABNORMAL HIGH (ref 0.00–0.07)
Basophils Absolute: 0 10*3/uL (ref 0.0–0.1)
Basophils Relative: 1 %
Eosinophils Absolute: 0.2 10*3/uL (ref 0.0–0.5)
Eosinophils Relative: 3 %
HCT: 34.1 % — ABNORMAL LOW (ref 39.0–52.0)
Hemoglobin: 11.2 g/dL — ABNORMAL LOW (ref 13.0–17.0)
Immature Granulocytes: 2 %
Lymphocytes Relative: 21 %
Lymphs Abs: 1.6 10*3/uL (ref 0.7–4.0)
MCH: 31.5 pg (ref 26.0–34.0)
MCHC: 32.8 g/dL (ref 30.0–36.0)
MCV: 96.1 fL (ref 80.0–100.0)
Monocytes Absolute: 0.6 10*3/uL (ref 0.1–1.0)
Monocytes Relative: 8 %
Neutro Abs: 4.9 10*3/uL (ref 1.7–7.7)
Neutrophils Relative %: 65 %
Platelets: 317 10*3/uL (ref 150–400)
RBC: 3.55 MIL/uL — ABNORMAL LOW (ref 4.22–5.81)
RDW: 12.6 % (ref 11.5–15.5)
WBC: 7.4 10*3/uL (ref 4.0–10.5)
nRBC: 0 % (ref 0.0–0.2)

## 2022-11-03 LAB — BASIC METABOLIC PANEL
Anion gap: 11 (ref 5–15)
BUN: 33 mg/dL — ABNORMAL HIGH (ref 8–23)
CO2: 29 mmol/L (ref 22–32)
Calcium: 8.8 mg/dL — ABNORMAL LOW (ref 8.9–10.3)
Chloride: 95 mmol/L — ABNORMAL LOW (ref 98–111)
Creatinine, Ser: 1.43 mg/dL — ABNORMAL HIGH (ref 0.61–1.24)
GFR, Estimated: 51 mL/min — ABNORMAL LOW (ref >60)
Glucose, Bld: 114 mg/dL — ABNORMAL HIGH (ref 70–99)
Potassium: 3.6 mmol/L (ref 3.5–5.1)
Sodium: 135 mmol/L (ref 135–145)

## 2022-11-03 LAB — TROPONIN I (HIGH SENSITIVITY)
Troponin I (High Sensitivity): 22 ng/L — ABNORMAL HIGH (ref <18)
Troponin I (High Sensitivity): 22 ng/L — ABNORMAL HIGH (ref <18)

## 2022-11-03 LAB — BRAIN NATRIURETIC PEPTIDE: B Natriuretic Peptide: 386 pg/mL — ABNORMAL HIGH (ref 0.0–100.0)

## 2022-11-03 MED ORDER — FUROSEMIDE 10 MG/ML IJ SOLN
60.0000 mg | Freq: Once | INTRAMUSCULAR | Status: AC
  Administered 2022-11-03: 60 mg via INTRAVENOUS
  Filled 2022-11-03: qty 6

## 2022-11-03 NOTE — ED Notes (Signed)
Pharmacy contacted to reconcile meds at request of provider.

## 2022-11-03 NOTE — ED Triage Notes (Signed)
Pt c/o sob x 2 weeks; pt also c/o leg swelling and feeling dizzy  Pt was seen here yesterday and had lab work done but pt states he could not stay yesterday and had to leave

## 2022-11-03 NOTE — ED Provider Notes (Signed)
EMERGENCY DEPARTMENT AT [facility] Provider Note   CSN: [id number] Arrival date & time: 11/03/22  0846     History  Chief Complaint  Patient presents with   Shortness of Breath    Luis Donovan is a 77 y.o. male.   Shortness of Breath Associated symptoms: chest pain and vomiting   Associated symptoms: no abdominal pain, no cough, no diaphoresis, no fever, no headaches, no rash and no wheezing         Luis Donovan is a 77 y.o. male past medical history of hypertension, anemia, CHF, prior stroke, who also has cardiac defibrillator who presents to the Emergency Department complaining of persistent shortness of breath.  He was seen here on 10/29/2022 for similar symptoms.  Diuresed with Lasix and felt better was discharged home.  Was recommended to increase his 20 mg Lasix to 30 mg Lasix once daily.  He states he has had no improvement from the increased oral Lasix.  Returns today with shortness of breath and swelling to both legs.  Dyspnea is mostly exertional.  Endorses occasional dull pains of his chest, but not recently.  He denies fever, cough, chills.  Does not have supplemental oxygen requirement  Patient states most of his care is through the [facility] in [place].   Home Medications Prior to Admission medications   Medication Sig Start Date End Date Taking? Authorizing Provider  amLODipine-olmesartan (AZOR) 10-40 MG per tablet Take 1 tablet by mouth daily.    [provider]  amoxicillin (AMOXIL) 500 MG capsule Take 1 capsule (500 mg total) by mouth 2 (two) times daily. 03/16/14   Teressa Lower, NP  aspirin 325 MG tablet Take 325 mg by mouth daily.    [provider]  doxycycline (VIBRAMYCIN) 100 MG capsule Take 1 capsule (100 mg total) by mouth 2 (two) times daily. 03/15/18   Ivery Quale, PA-C  FLUoxetine (PROZAC) 20 MG capsule Take 20 mg by mouth daily.    [provider]  hydrochlorothiazide (HYDRODIURIL) 25 MG  tablet Take 25 mg by mouth daily.    [provider]  HYDROcodone-acetaminophen (NORCO/VICODIN) 5-325 MG tablet Take 1-2 tablets by mouth every 4 (four) hours as needed. 03/15/18   Ivery Quale, PA-C  ibuprofen (ADVIL,MOTRIN) 200 MG tablet Take 200 mg by mouth every 6 (six) hours as needed for moderate pain. For pain    [provider]  latanoprost (XALATAN) 0.005 % ophthalmic solution Place 1 drop into both eyes at bedtime.    [provider]  metoprolol succinate (TOPROL XL) 100 MG 24 hr tablet Take 50 mg by mouth daily.     [provider]  omeprazole (PRILOSEC) 20 MG capsule Take 20 mg by mouth daily.    [provider]  oxyCODONE (OXY IR/ROXICODONE) 5 MG immediate release tablet Take 5 mg by mouth every 6 (six) hours as needed for moderate pain or severe pain.    [provider]  promethazine-dextromethorphan (PROMETHAZINE-DM) 6.25-15 MG/5ML syrup Take 5 mLs by mouth 4 (four) times daily as needed for cough. 12/07/20   Bing Neighbors, NP  rosuvastatin (CRESTOR) 10 MG tablet Take 10 mg by mouth daily.    [provider]  traMADol (ULTRAM) 50 MG tablet Take 50 mg by mouth every 6 (six) hours as needed. For pain    [provider]  traZODone (DESYREL) 50 MG tablet Take 50 mg by mouth at bedtime.    [provider]  Allergies    Patient has no known allergies.    Review of Systems   Review of Systems  Constitutional:  Negative for appetite change, diaphoresis and fever.  HENT:  Negative for congestion.   Respiratory:  Positive for shortness of breath. Negative for cough and wheezing.   Cardiovascular:  Positive for chest pain and leg swelling.  Gastrointestinal:  Positive for vomiting. Negative for abdominal pain and nausea.  Genitourinary:  Negative for difficulty urinating.  Musculoskeletal:  Negative for back pain.  Skin:  Negative for color change and rash.  Neurological:  Negative for dizziness  and headaches.  Psychiatric/Behavioral:  Negative for confusion.     Physical Exam Updated Vital Signs BP (!) 148/66 (BP Location: Right Arm)   Pulse 80   Temp 98.8 F (37.1 C) (Oral)   Resp 17   Ht 5\' 7"  (1.702 m)   Wt 102 kg   SpO2 99%   BMI 35.24 kg/m  Physical Exam Vitals and nursing note reviewed.  Constitutional:      General: He is not in acute distress.    Appearance: He is well-developed. He is not ill-appearing or toxic-appearing.  HENT:     Head: Normocephalic.     Mouth/Throat:     Mouth: Mucous membranes are moist.  Cardiovascular:     Rate and Rhythm: Normal rate and regular rhythm.     Pulses: Normal pulses.  Pulmonary:     Effort: Pulmonary effort is normal.     Breath sounds: Normal breath sounds.  Abdominal:     General: There is no distension.     Palpations: Abdomen is soft.     Tenderness: There is no abdominal tenderness.  Musculoskeletal:        General: No swelling or tenderness.     Right lower leg: Edema present.     Left lower leg: Edema present.     Comments: No posterior calf pain, no palpable cord.  Negative Homans' sign  Skin:    General: Skin is warm.     Capillary Refill: Capillary refill takes less than 2 seconds.     Findings: No erythema or rash.  Neurological:     General: No focal deficit present.     Mental Status: He is alert.     Sensory: No sensory deficit.     Motor: No weakness.     ED Results / Procedures / Treatments   Labs (all labs ordered are listed, but only abnormal results are displayed) Labs Reviewed  BASIC METABOLIC PANEL - Abnormal; Notable for the following components:      Result Value   Chloride 95 (*)    Glucose, Bld 114 (*)    BUN 33 (*)    Creatinine, Ser 1.43 (*)    Calcium 8.8 (*)    GFR, Estimated 51 (*)    All other components within normal limits  BRAIN NATRIURETIC PEPTIDE - Abnormal; Notable for the following components:   B Natriuretic Peptide 386.0 (*)    All other components within  normal limits  CBC WITH DIFFERENTIAL/PLATELET - Abnormal; Notable for the following components:   RBC 3.55 (*)    Hemoglobin 11.2 (*)    HCT 34.1 (*)    Abs Immature Granulocytes 0.11 (*)    All other components within normal limits  TROPONIN I (HIGH SENSITIVITY) - Abnormal; Notable for the following components:   Troponin I (High Sensitivity) 22 (*)    All other components within normal limits  TROPONIN  I (HIGH SENSITIVITY) - Abnormal; Notable for the following components:   Troponin I (High Sensitivity) 22 (*)    All other components within normal limits    EKG None  Radiology DG Chest 2 View  Result Date: 11/02/2022 CLINICAL DATA:  Shortness of breath EXAM: CHEST - 2 VIEW COMPARISON:  10/28/2022 x-ray FINDINGS: Stable defibrillator with battery pack along left upper chest. Prominent pulmonary central vasculature. No consolidation, pneumothorax, effusion or edema. Degenerative changes seen of the spine. Left shoulder reverse arthroplasty identified. Apical pleural thickening. IMPRESSION: No acute cardiopulmonary disease.  Defibrillator. Electronically Signed   By: Karen Kays M.D.   On: 11/02/2022 17:27    Procedures Procedures    Medications Ordered in ED Medications - No data to display  ED Course/ Medical Decision Making/ A&P                             Medical Decision Making Patient here with history of CHF, takes Lasix daily.  Here for increased shortness of breath and swelling of his legs.  He endorses some exertional dyspnea as well.  Receives his medical care at the [facility] in [place].  He was seen here 5 days ago for same improved after receiving IV Lasix.  Differential would include but not limited to CHF exacerbation, pneumonia, ACS.  I suspect symptoms are related to CHF exacerbation.  Will recheck his labs, chest x-ray EKG and he will likely need IV Lasix.  His vital signs currently are reassuring.  There is no increased work of breathing and his lungs are clear  to auscultation.  PCP and cardiology is through the [facility], I am unable to view prior medical records.   Amount and/or Complexity of Data Reviewed Labs: ordered.    Details:  no evidence of leukocytosis, chemistries show some renal insufficiency with BUN of 33 and serum creatinine of 1.4.  This is near patient's baseline.  BNP slightly elevated at 386.  Delta troponin remains flat Radiology: ordered.    Details: Chest x-ray without acute cardiopulmonary disease no evidence of consolidation effusion or edema Discussion of management or test interpretation with external provider(s): Patient here with suspected mild CHF exacerbation.  My clinical concern for ACS is low.  Will try diuresis here and reassess.  I suspect if patient is improving he will be appropriate for discharge home and close outpatient follow-up with his PCP as he has an appointment later this week   On recheck, patient resting comfortably.  Notes improvement of his symptoms with diuresis.  Approximately 1200 cc urine output during ER stay.  I feel that he is appropriate for discharge home at this time, will have him increase his Lasix dose to 80 mg total per day.  He does have an appointment with PCP at the [facility] on Thursday of this week.   I suspect given patient's history, he has had previous echocardiogram but he is unable to verify this for me and I am unable to review his medical records.  States he does have a cardiologist that he sees through the [facility].  I am recommended follow-up with his cardiologist and he would likely benefit from echocardiogram if he has not had one recently.  Risk Prescription drug management.           Final Clinical Impression(s) / ED Diagnoses Final diagnoses:  Acute on chronic congestive heart failure, unspecified heart failure type (HCC)    Rx / DC Orders ED  Discharge Orders     None         Pauline Aus, Cordelia Poche 11/06/22 1554    Gerhard Munch, MD 11/07/22 1130

## 2022-11-03 NOTE — Discharge Instructions (Signed)
Increase your Lasix dose to 80 mg by mouth daily until you are seen on Thursday by your PCP.  I also recommend that you arrange follow-up appointment with your cardiologist.  Return to the emergency department for any new or worsening symptoms.

## 2022-11-03 NOTE — ED Notes (Signed)
No answer in waiting room X1,  

## 2022-12-13 ENCOUNTER — Emergency Department (HOSPITAL_COMMUNITY): Payer: No Typology Code available for payment source

## 2022-12-13 ENCOUNTER — Encounter (HOSPITAL_COMMUNITY): Payer: Self-pay

## 2022-12-13 ENCOUNTER — Other Ambulatory Visit: Payer: Self-pay

## 2022-12-13 ENCOUNTER — Inpatient Hospital Stay (HOSPITAL_COMMUNITY)
Admission: EM | Admit: 2022-12-13 | Discharge: 2022-12-15 | DRG: 948 | Disposition: A | Discharge status: Home / Self Care | Payer: No Typology Code available for payment source | Attending: Internal Medicine | Admitting: Internal Medicine

## 2022-12-13 DIAGNOSIS — R590 Localized enlarged lymph nodes: Secondary | ICD-10-CM | POA: Diagnosis not present

## 2022-12-13 DIAGNOSIS — N1339 Other hydronephrosis: Secondary | ICD-10-CM | POA: Diagnosis present

## 2022-12-13 DIAGNOSIS — E785 Hyperlipidemia, unspecified: Secondary | ICD-10-CM | POA: Diagnosis present

## 2022-12-13 DIAGNOSIS — I10 Essential (primary) hypertension: Secondary | ICD-10-CM | POA: Diagnosis not present

## 2022-12-13 DIAGNOSIS — C7982 Secondary malignant neoplasm of genital organs: Secondary | ICD-10-CM

## 2022-12-13 DIAGNOSIS — Z8673 Personal history of transient ischemic attack (TIA), and cerebral infarction without residual deficits: Secondary | ICD-10-CM

## 2022-12-13 DIAGNOSIS — Z9861 Coronary angioplasty status: Secondary | ICD-10-CM | POA: Diagnosis not present

## 2022-12-13 DIAGNOSIS — I252 Old myocardial infarction: Secondary | ICD-10-CM | POA: Diagnosis not present

## 2022-12-13 DIAGNOSIS — G4730 Sleep apnea, unspecified: Secondary | ICD-10-CM | POA: Diagnosis not present

## 2022-12-13 DIAGNOSIS — I2510 Atherosclerotic heart disease of native coronary artery without angina pectoris: Secondary | ICD-10-CM

## 2022-12-13 DIAGNOSIS — C799 Secondary malignant neoplasm of unspecified site: Principal | ICD-10-CM

## 2022-12-13 DIAGNOSIS — R52 Pain, unspecified: Secondary | ICD-10-CM

## 2022-12-13 DIAGNOSIS — F4310 Post-traumatic stress disorder, unspecified: Secondary | ICD-10-CM | POA: Diagnosis not present

## 2022-12-13 DIAGNOSIS — Z79899 Other long term (current) drug therapy: Secondary | ICD-10-CM

## 2022-12-13 DIAGNOSIS — C801 Malignant (primary) neoplasm, unspecified: Secondary | ICD-10-CM | POA: Diagnosis present

## 2022-12-13 DIAGNOSIS — E871 Hypo-osmolality and hyponatremia: Secondary | ICD-10-CM | POA: Diagnosis present

## 2022-12-13 DIAGNOSIS — K219 Gastro-esophageal reflux disease without esophagitis: Secondary | ICD-10-CM | POA: Diagnosis not present

## 2022-12-13 DIAGNOSIS — C7951 Secondary malignant neoplasm of bone: Secondary | ICD-10-CM

## 2022-12-13 DIAGNOSIS — G893 Neoplasm related pain (acute) (chronic): Secondary | ICD-10-CM | POA: Diagnosis not present

## 2022-12-13 DIAGNOSIS — Z8674 Personal history of sudden cardiac arrest: Secondary | ICD-10-CM

## 2022-12-13 DIAGNOSIS — I129 Hypertensive chronic kidney disease with stage 1 through stage 4 chronic kidney disease, or unspecified chronic kidney disease: Secondary | ICD-10-CM | POA: Diagnosis not present

## 2022-12-13 DIAGNOSIS — Z7901 Long term (current) use of anticoagulants: Secondary | ICD-10-CM | POA: Diagnosis not present

## 2022-12-13 DIAGNOSIS — N1831 Chronic kidney disease, stage 3a: Secondary | ICD-10-CM | POA: Diagnosis not present

## 2022-12-13 DIAGNOSIS — M546 Pain in thoracic spine: Secondary | ICD-10-CM | POA: Diagnosis present

## 2022-12-13 DIAGNOSIS — Z87891 Personal history of nicotine dependence: Secondary | ICD-10-CM

## 2022-12-13 DIAGNOSIS — R0789 Other chest pain: Secondary | ICD-10-CM | POA: Diagnosis present

## 2022-12-13 DIAGNOSIS — Z9185 Personal history of military service: Secondary | ICD-10-CM

## 2022-12-13 DIAGNOSIS — K5900 Constipation, unspecified: Secondary | ICD-10-CM | POA: Diagnosis not present

## 2022-12-13 DIAGNOSIS — Z6834 Body mass index (BMI) 34.0-34.9, adult: Secondary | ICD-10-CM

## 2022-12-13 DIAGNOSIS — D631 Anemia in chronic kidney disease: Secondary | ICD-10-CM | POA: Diagnosis present

## 2022-12-13 DIAGNOSIS — E669 Obesity, unspecified: Secondary | ICD-10-CM | POA: Diagnosis present

## 2022-12-13 DIAGNOSIS — Z87820 Personal history of traumatic brain injury: Secondary | ICD-10-CM

## 2022-12-13 DIAGNOSIS — I639 Cerebral infarction, unspecified: Secondary | ICD-10-CM | POA: Diagnosis present

## 2022-12-13 DIAGNOSIS — G4733 Obstructive sleep apnea (adult) (pediatric): Secondary | ICD-10-CM | POA: Diagnosis present

## 2022-12-13 DIAGNOSIS — N179 Acute kidney failure, unspecified: Secondary | ICD-10-CM | POA: Diagnosis present

## 2022-12-13 DIAGNOSIS — Z95810 Presence of automatic (implantable) cardiac defibrillator: Secondary | ICD-10-CM

## 2022-12-13 DIAGNOSIS — C7989 Secondary malignant neoplasm of other specified sites: Secondary | ICD-10-CM | POA: Diagnosis not present

## 2022-12-13 LAB — BASIC METABOLIC PANEL
Anion gap: 6 (ref 5–15)
BUN: 46 mg/dL — ABNORMAL HIGH (ref 8–23)
CO2: 27 mmol/L (ref 22–32)
Calcium: 8.6 mg/dL — ABNORMAL LOW (ref 8.9–10.3)
Chloride: 98 mmol/L (ref 98–111)
Creatinine, Ser: 1.81 mg/dL — ABNORMAL HIGH (ref 0.61–1.24)
GFR, Estimated: 39 mL/min — ABNORMAL LOW (ref >60)
Glucose, Bld: 129 mg/dL — ABNORMAL HIGH (ref 70–99)
Potassium: 4.1 mmol/L (ref 3.5–5.1)
Sodium: 131 mmol/L — ABNORMAL LOW (ref 135–145)

## 2022-12-13 LAB — CBC
HCT: 29 % — ABNORMAL LOW (ref 39.0–52.0)
Hemoglobin: 9.1 g/dL — ABNORMAL LOW (ref 13.0–17.0)
MCH: 30.1 pg (ref 26.0–34.0)
MCHC: 31.4 g/dL (ref 30.0–36.0)
MCV: 96 fL (ref 80.0–100.0)
Platelets: 347 10*3/uL (ref 150–400)
RBC: 3.02 MIL/uL — ABNORMAL LOW (ref 4.22–5.81)
RDW: 13.6 % (ref 11.5–15.5)
WBC: 6.3 10*3/uL (ref 4.0–10.5)
nRBC: 0.3 % — ABNORMAL HIGH (ref 0.0–0.2)

## 2022-12-13 LAB — TROPONIN I (HIGH SENSITIVITY)
Troponin I (High Sensitivity): 27 ng/L — ABNORMAL HIGH (ref <18)
Troponin I (High Sensitivity): 30 ng/L — ABNORMAL HIGH (ref <18)

## 2022-12-13 LAB — PSA: Prostatic Specific Antigen: 33.36 ng/mL — ABNORMAL HIGH (ref 0.00–4.00)

## 2022-12-13 LAB — HEPATIC FUNCTION PANEL
ALT: 27 U/L (ref 0–44)
AST: 26 U/L (ref 15–41)
Albumin: 3.2 g/dL — ABNORMAL LOW (ref 3.5–5.0)
Alkaline Phosphatase: 469 U/L — ABNORMAL HIGH (ref 38–126)
Bilirubin, Direct: 0.2 mg/dL (ref 0.0–0.2)
Indirect Bilirubin: 0.4 mg/dL (ref 0.3–0.9)
Total Bilirubin: 0.6 mg/dL (ref 0.3–1.2)
Total Protein: 7.6 g/dL (ref 6.5–8.1)

## 2022-12-13 MED ORDER — FENTANYL CITRATE PF 50 MCG/ML IJ SOSY
50.0000 ug | PREFILLED_SYRINGE | Freq: Once | INTRAMUSCULAR | Status: AC
  Administered 2022-12-13: 50 ug via INTRAVENOUS
  Filled 2022-12-13: qty 1

## 2022-12-13 MED ORDER — FLUOXETINE HCL 20 MG PO CAPS
80.0000 mg | ORAL_CAPSULE | Freq: Every day | ORAL | Status: DC
  Administered 2022-12-14 – 2022-12-15 (×2): 80 mg via ORAL
  Filled 2022-12-13 (×2): qty 4

## 2022-12-13 MED ORDER — SODIUM CHLORIDE 0.9% FLUSH: 3.0000 mL | INTRAVENOUS | Status: DC | PRN

## 2022-12-13 MED ORDER — ALBUTEROL SULFATE (2.5 MG/3ML) 0.083% IN NEBU: 2.5000 mg | INHALATION_SOLUTION | RESPIRATORY_TRACT | Status: DC | PRN

## 2022-12-13 MED ORDER — POLYETHYLENE GLYCOL 3350 17 G PO PACK
17.0000 g | PACK | Freq: Every day | ORAL | Status: DC
  Administered 2022-12-13: 17 g via ORAL
  Filled 2022-12-13: qty 1

## 2022-12-13 MED ORDER — APIXABAN 5 MG PO TABS: 5.0000 mg | ORAL_TABLET | Freq: Two times a day (BID) | ORAL | Status: DC

## 2022-12-13 MED ORDER — IOHEXOL 350 MG/ML SOLN
80.0000 mL | Freq: Once | INTRAVENOUS | Status: AC | PRN
  Administered 2022-12-13: 80 mL via INTRAVENOUS

## 2022-12-13 MED ORDER — ATORVASTATIN CALCIUM 20 MG PO TABS
40.0000 mg | ORAL_TABLET | Freq: Every day | ORAL | Status: DC
  Administered 2022-12-13 – 2022-12-15 (×3): 40 mg via ORAL
  Filled 2022-12-13 (×3): qty 2

## 2022-12-13 MED ORDER — ONDANSETRON HCL 4 MG PO TABS: 4.0000 mg | ORAL_TABLET | Freq: Four times a day (QID) | ORAL | Status: DC | PRN

## 2022-12-13 MED ORDER — ARIPIPRAZOLE 2 MG PO TABS
2.0000 mg | ORAL_TABLET | Freq: Every day | ORAL | Status: DC
  Administered 2022-12-14 – 2022-12-15 (×2): 2 mg via ORAL
  Filled 2022-12-13 (×3): qty 1

## 2022-12-13 MED ORDER — POLYETHYLENE GLYCOL 3350 17 G PO PACK: 17.0000 g | PACK | Freq: Every day | ORAL | Status: DC | PRN

## 2022-12-13 MED ORDER — ACETAMINOPHEN 325 MG PO TABS
650.0000 mg | ORAL_TABLET | Freq: Four times a day (QID) | ORAL | Status: DC | PRN
  Administered 2022-12-13 – 2022-12-14 (×2): 650 mg via ORAL
  Filled 2022-12-13 (×2): qty 2

## 2022-12-13 MED ORDER — TRAZODONE HCL 50 MG PO TABS
50.0000 mg | ORAL_TABLET | Freq: Every day | ORAL | Status: DC
  Administered 2022-12-13 – 2022-12-14 (×2): 50 mg via ORAL
  Filled 2022-12-13 (×2): qty 1

## 2022-12-13 MED ORDER — SENNOSIDES-DOCUSATE SODIUM 8.6-50 MG PO TABS
2.0000 | ORAL_TABLET | Freq: Every day | ORAL | Status: DC
  Administered 2022-12-13 – 2022-12-14 (×2): 2 via ORAL
  Filled 2022-12-13 (×2): qty 2

## 2022-12-13 MED ORDER — SODIUM CHLORIDE 0.9% FLUSH
3.0000 mL | Freq: Two times a day (BID) | INTRAVENOUS | Status: DC
  Administered 2022-12-13: 3 mL via INTRAVENOUS

## 2022-12-13 MED ORDER — HYDRALAZINE HCL 50 MG PO TABS
50.0000 mg | ORAL_TABLET | Freq: Three times a day (TID) | ORAL | Status: DC
  Administered 2022-12-13 – 2022-12-15 (×6): 50 mg via ORAL
  Filled 2022-12-13 (×6): qty 1

## 2022-12-13 MED ORDER — OXYCODONE HCL 5 MG PO TABS
10.0000 mg | ORAL_TABLET | Freq: Once | ORAL | Status: AC
  Administered 2022-12-13: 10 mg via ORAL
  Filled 2022-12-13: qty 2

## 2022-12-13 MED ORDER — FENTANYL CITRATE PF 50 MCG/ML IJ SOSY
50.0000 ug | PREFILLED_SYRINGE | INTRAMUSCULAR | Status: DC | PRN
  Administered 2022-12-14: 50 ug via INTRAVENOUS
  Filled 2022-12-13: qty 1

## 2022-12-13 MED ORDER — SODIUM CHLORIDE 0.9 % IV SOLN: INTRAVENOUS | Status: DC

## 2022-12-13 MED ORDER — LABETALOL HCL 5 MG/ML IV SOLN: 10.0000 mg | INTRAVENOUS | Status: DC | PRN

## 2022-12-13 MED ORDER — ONDANSETRON HCL 4 MG/2ML IJ SOLN: 4.0000 mg | Freq: Four times a day (QID) | INTRAMUSCULAR | Status: DC | PRN

## 2022-12-13 MED ORDER — SODIUM CHLORIDE 0.9% FLUSH
3.0000 mL | Freq: Two times a day (BID) | INTRAVENOUS | Status: DC
  Administered 2022-12-13 – 2022-12-14 (×2): 3 mL via INTRAVENOUS

## 2022-12-13 MED ORDER — TRAZODONE HCL 50 MG PO TABS: 50.0000 mg | ORAL_TABLET | Freq: Every evening | ORAL | Status: DC | PRN

## 2022-12-13 MED ORDER — SODIUM CHLORIDE 0.9 % IV SOLN: INTRAVENOUS | Status: DC | PRN

## 2022-12-13 MED ORDER — METHOCARBAMOL 500 MG PO TABS
500.0000 mg | ORAL_TABLET | Freq: Three times a day (TID) | ORAL | Status: DC
  Administered 2022-12-13 – 2022-12-15 (×6): 500 mg via ORAL
  Filled 2022-12-13 (×6): qty 1

## 2022-12-13 MED ORDER — TIMOLOL MALEATE 0.5 % OP SOLG
1.0000 [drp] | Freq: Every day | OPHTHALMIC | Status: DC
  Administered 2022-12-14: 1 [drp] via OPHTHALMIC
  Filled 2022-12-13: qty 5

## 2022-12-13 MED ORDER — PANTOPRAZOLE SODIUM 40 MG PO TBEC
40.0000 mg | DELAYED_RELEASE_TABLET | Freq: Every day | ORAL | Status: DC
  Administered 2022-12-14 – 2022-12-15 (×2): 40 mg via ORAL
  Filled 2022-12-13 (×2): qty 1

## 2022-12-13 MED ORDER — OXYCODONE HCL 5 MG PO TABS
5.0000 mg | ORAL_TABLET | ORAL | Status: DC | PRN
  Administered 2022-12-13 – 2022-12-14 (×2): 5 mg via ORAL
  Filled 2022-12-13 (×2): qty 1

## 2022-12-13 MED ORDER — FENTANYL CITRATE PF 50 MCG/ML IJ SOSY: 25.0000 ug | PREFILLED_SYRINGE | INTRAMUSCULAR | Status: DC | PRN

## 2022-12-13 MED ORDER — BISACODYL 10 MG RE SUPP: 10.0000 mg | Freq: Every day | RECTAL | Status: DC | PRN

## 2022-12-13 MED ORDER — MORPHINE SULFATE (PF) 4 MG/ML IV SOLN
4.0000 mg | Freq: Once | INTRAVENOUS | Status: AC
  Administered 2022-12-13: 4 mg via INTRAVENOUS
  Filled 2022-12-13: qty 1

## 2022-12-13 MED ORDER — VITAMIN B-12 1000 MCG PO TABS
500.0000 ug | ORAL_TABLET | Freq: Every day | ORAL | Status: DC
  Administered 2022-12-14 – 2022-12-15 (×2): 500 ug via ORAL
  Filled 2022-12-13 (×2): qty 1

## 2022-12-13 MED ORDER — ACETAMINOPHEN 650 MG RE SUPP: 650.0000 mg | Freq: Four times a day (QID) | RECTAL | Status: DC | PRN

## 2022-12-13 NOTE — ED Provider Notes (Signed)
[place] EMERGENCY DEPARTMENT AT [facility] Provider Note   CSN: [id number] Arrival date & time: 12/13/22  7829     History  Chief Complaint  Patient presents with   Chest Pain   Back Pain    Luis Donovan is a 77 y.o. male.  Patient with history of CVA, GERD, hyperlipidemia, hypertension, CHF, defibrillator in place on [employer] presents today with complaints of chest pain and back pain. He states that he has been having some chest pain and back pain for years and has been seen for same without clear etiology of symptoms. He states that around 3 days ago his pain became severe and has been persistent since. Pain is sharp in nature and triggered with any movements. He states originally he thought it was muscle soreness as he had been doing some lifting, however it has continued to be severe despite several rounds of NSAIDs without any improvement or relief. He notes he also feels a bit more short of breath. He does not feel fluid overloaded as his legs and abdomen are not swollen which is normally the case when he needs diuresis. He denies any fevers, chills, abdominal pain, nausea, vomiting, or diarrhea. No night sweats or unexplained weight loss.   The history is provided by the patient. No language interpreter was used.  Chest Pain Associated symptoms: back pain   Back Pain Associated symptoms: chest pain        Home Medications Prior to Admission medications   Medication Sig Start Date End Date Taking? Authorizing Provider  albuterol (VENTOLIN HFA) 108 (90 Base) MCG/ACT inhaler Inhale 2 puffs into the lungs every 4 (four) hours as needed for wheezing or shortness of breath. 07/16/22   [provider]  apixaban (ELIQUIS) 5 MG TABS tablet Take 5 mg by mouth 2 (two) times daily. 02/08/22   [provider]  ARIPiprazole (ABILIFY) 2 MG tablet Take 2 mg by mouth daily. 12/03/21   [provider]  atorvastatin (LIPITOR) 80 MG tablet Take 40 mg by  mouth daily. 06/04/22   [provider]  cetirizine (ZYRTEC) 10 MG tablet Take 1 tablet by mouth daily as needed for allergies. 07/16/22   [provider]  Cholecalciferol 50 MCG (2000 UT) TABS Take 1 tablet by mouth every morning. 07/16/22   [provider]  clotrimazole (LOTRIMIN) 1 % cream Apply 1 Application topically 2 (two) times daily.    [provider]  cyanocobalamin (VITAMIN B12) 500 MCG tablet Take 1 tablet by mouth daily. 07/16/22   [provider]  diclofenac Sodium (VOLTAREN) 1 % GEL Apply 2 g topically 4 (four) times daily as needed (pain). 07/16/22   [provider]  FLUoxetine (PROZAC) 20 MG capsule Take 80 mg by mouth daily. 03/03/22   [provider]  furosemide (LASIX) 20 MG tablet Take 60 mg by mouth daily. 08/27/22   [provider]  hydrALAZINE (APRESOLINE) 50 MG tablet Take 50 mg by mouth 3 (three) times daily. 07/16/22   [provider]  ibuprofen (ADVIL,MOTRIN) 200 MG tablet Take 200 mg by mouth every 6 (six) hours as needed for moderate pain. For pain    [provider]  KETOTIFEN FUMARATE OP Apply 1 drop to eye 2 (two) times daily as needed (allergies). 0.025%    [provider]  latanoprost (XALATAN) 0.005 % ophthalmic solution Place 1 drop into both eyes at bedtime.    [provider]  lidocaine (LIDODERM) 5 % Place 1  patch onto the skin daily. 07/16/22   [provider]  losartan (COZAAR) 100 MG tablet Take 100 mg by mouth daily. 06/04/22   [provider]  metoprolol succinate (TOPROL-XL) 50 MG 24 hr tablet Take 0.5 tablets by mouth daily. 06/04/22   [provider]  omeprazole (PRILOSEC) 20 MG capsule Take 20 mg by mouth daily.    [provider]  timolol (TIMOPTIC-XR) 0.5 % ophthalmic gel-forming Apply 1 drop to eye daily. 08/13/22   [provider]  traZODone (DESYREL) 50 MG tablet Take 50 mg by mouth at bedtime. 02/16/13    [provider]      Allergies    Patient has no known allergies.    Review of Systems   Review of Systems  Cardiovascular:  Positive for chest pain.  Musculoskeletal:  Positive for back pain.  All other systems reviewed and are negative.   Physical Exam Updated Vital Signs BP (!) 163/71   Pulse 80   Temp 98 F (36.7 C) (Oral)   Resp 16   Ht 5\' 8"  (1.727 m)   Wt 102.1 kg   SpO2 97%   BMI 34.21 kg/m  Physical Exam Vitals and nursing note reviewed.  Constitutional:      General: He is not in acute distress.    Appearance: Normal appearance. He is normal weight. He is not ill-appearing, toxic-appearing or diaphoretic.  HENT:     Head: Normocephalic and atraumatic.  Cardiovascular:     Rate and Rhythm: Normal rate and regular rhythm.     Heart sounds: Normal heart sounds.  Pulmonary:     Effort: Pulmonary effort is normal. No respiratory distress.     Breath sounds: Normal breath sounds.  Chest:     Chest wall: Tenderness present.     Comments: TTP of midsternal chest and upper back area. No step-offs, lesions, deformity, or overlying skin changes. Abdominal:     Palpations: Abdomen is soft.     Tenderness: There is no abdominal tenderness.  Musculoskeletal:        General: Normal range of motion.     Cervical back: Normal range of motion.     Right lower leg: No tenderness. No edema.     Left lower leg: No tenderness. No edema.  Skin:    General: Skin is warm and dry.  Neurological:     General: No focal deficit present.     Mental Status: He is alert.  Psychiatric:        Mood and Affect: Mood normal.        Behavior: Behavior normal.     ED Results / Procedures / Treatments   Labs (all labs ordered are listed, but only abnormal results are displayed) Labs Reviewed  BASIC METABOLIC PANEL - Abnormal; Notable for the following components:      Result Value   Sodium 131 (*)    Glucose, Bld 129 (*)    BUN 46 (*)    Creatinine, Ser 1.81 (*)     Calcium 8.6 (*)    GFR, Estimated 39 (*)    All other components within normal limits  CBC - Abnormal; Notable for the following components:   RBC 3.02 (*)    Hemoglobin 9.1 (*)    HCT 29.0 (*)    nRBC 0.3 (*)    All other components within normal limits  TROPONIN I (HIGH SENSITIVITY) - Abnormal; Notable for the following components:   Troponin I (High Sensitivity) 27 (*)  All other components within normal limits  TROPONIN I (HIGH SENSITIVITY) - Abnormal; Notable for the following components:   Troponin I (High Sensitivity) 30 (*)    All other components within normal limits  PSA  HEPATIC FUNCTION PANEL    EKG None  Radiology CT Angio Chest/Abd/Pel for Dissection W and/or Wo Contrast  Result Date: 12/13/2022 CLINICAL DATA:  Chest pain.  Back pain. EXAM: CT ANGIOGRAPHY CHEST, ABDOMEN AND PELVIS TECHNIQUE: Non-contrast CT of the chest was initially obtained. Multidetector CT imaging through the chest, abdomen and pelvis was performed using the standard protocol during bolus administration of intravenous contrast. Multiplanar reconstructed images and MIPs were obtained and reviewed to evaluate the vascular anatomy. RADIATION DOSE REDUCTION: This exam was performed according to the departmental dose-optimization program which includes automated exposure control, adjustment of the mA and/or kV according to patient size and/or use of iterative reconstruction technique. CONTRAST:  80mL OMNIPAQUE IOHEXOL 350 MG/ML SOLN COMPARISON:  September 15, 2010. FINDINGS: CTA CHEST FINDINGS Cardiovascular: Atherosclerosis of thoracic aorta is noted without aneurysm or dissection. Normal cardiac size. No pericardial effusion. Coronary artery calcifications are noted. Mediastinum/Nodes: Thyroid gland and esophagus are unremarkable. Multiple enlarged lymph nodes are seen along the left side of the superior mediastinum, with the largest measuring 12 mm. Lungs/Pleura: No pneumothorax or pleural effusion is noted.  Minimal emphysematous disease is noted in the lower lobes. Minimal bibasilar scarring is noted as well. Musculoskeletal: Diffuse sclerotic densities are noted throughout the thoracic spine and ribs and sternum consistent with osseous metastases. Review of the MIP images confirms the above findings. CTA ABDOMEN AND PELVIS FINDINGS VASCULAR Aorta: Atherosclerosis of abdominal aorta is noted without aneurysm or dissection. Celiac: Patent without evidence of aneurysm, dissection, vasculitis or significant stenosis. SMA: Patent without evidence of aneurysm, dissection, vasculitis or significant stenosis. Renals: Not well visualized due to probable motion artifact. IMA: Not well visualized due to possible motion artifact. Inflow: Patent without evidence of aneurysm, dissection, vasculitis or significant stenosis. Veins: No obvious venous abnormality within the limitations of this arterial phase study. Review of the MIP images confirms the above findings. NON-VASCULAR Hepatobiliary: No focal liver abnormality is seen. No gallstones, gallbladder wall thickening, or biliary dilatation. Pancreas: Unremarkable. No pancreatic ductal dilatation or surrounding inflammatory changes. Spleen: Normal in size without focal abnormality. Adrenals/Urinary Tract: Adrenal glands appear normal. Mild right hydroureteronephrosis is noted without obstructing calculus. This appears to be due to irregular mass measuring 4.0 x 2.6 cm involving the right and posterior aspect of the urinary bladder near the right ureterovesical junction. This is concerning for malignancy. Stomach/Bowel: Stomach is within normal limits. Appendix appears normal. No evidence of bowel wall thickening, distention, or inflammatory changes. Diffuse colonic diverticulosis is noted without inflammation. Lymphatic: Extensive retroperitoneal adenopathy is noted, with largest lymph node measuring 2 cm in left periaortic region. Right common and external iliac adenopathy is  noted as well. Largest lymph node measures 2.1 cm in right external iliac region. Left common iliac lymph node is noted measuring 15 mm. 13 mm right inguinal lymph node is noted. Reproductive: Mild prostatic enlargement is noted. It appears to extend into inferior portion of urinary bladder. Other: No abdominal wall hernia or abnormality. No abdominopelvic ascites. Musculoskeletal: Multiple sclerotic densities are noted in the lumbar spine, pelvis and visualized femurs consistent with metastatic disease. Review of the MIP images confirms the above findings. IMPRESSION: Mild mediastinal adenopathy is noted as well as extensive periaortic and right iliac adenopathy is noted most consistent with metastatic disease  or possibly lymphoma. Right inguinal adenopathy is noted as well. Please see above for measurements. Also noted are diffuse sclerotic densities throughout the visualized skeleton consistent with osseous metastases. Mild right hydroureteronephrosis is noted which appears to be due to occlusion from irregular mass involving the posterior portion of the urinary bladder in the region of the right ureterovesical junction. It is uncertain if this mass represents primary bladder cancer, or potentially may represent extension of prostate cancer. Cystoscopy is recommended for further evaluation. No definite evidence of thoracic or abdominal aortic dissection or aneurysm. Aortic Atherosclerosis (ICD10-I70.0). Electronically Signed   By: Lupita Raider M.D.   On: 12/13/2022 12:13   DG Chest Port 1 View  Result Date: 12/13/2022 CLINICAL DATA:  [id number] Chest pain [id number] EXAM: PORTABLE CHEST 1 VIEW COMPARISON:  11/03/2022 FINDINGS: Left-sided implanted cardiac device, stable. Slight prominence of the cardiac silhouette is likely accentuated by low lung volumes. Aortic atherosclerosis. No focal airspace consolidation, pleural effusion, or pneumothorax. Reverse left shoulder arthroplasty. 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 IMPRESSION: Low lung volumes. No acute cardiopulmonary findings. Electronically Signed   By: Duanne Guess D.O.   On: 12/13/2022 10:45    Procedures Procedures    Medications Ordered in ED Medications  morphine (PF) 4 MG/ML injection 4 mg (has no administration in time range)  oxyCODONE (Oxy IR/ROXICODONE) immediate release tablet 5 mg (has no administration in time range)  fentaNYL (SUBLIMAZE) injection 25 mcg (has no administration in time range)  0.9 %  sodium chloride infusion (has no administration in time range)  methocarbamol (ROBAXIN) tablet 500 mg (has no administration in time range)  traZODone (DESYREL) tablet 50 mg (has no administration in time range)  senna-docusate (Senokot-S) tablet 2 tablet (has no administration in time range)  polyethylene glycol (MIRALAX / GLYCOLAX) packet 17 g (has no administration in time range)  fentaNYL (SUBLIMAZE) injection 50 mcg (has no administration in time range)  oxyCODONE (Oxy IR/ROXICODONE) immediate release tablet 10 mg (has no administration in time range)  iohexol (OMNIPAQUE) 350 MG/ML injection 80 mL (80 mLs Intravenous Contrast Given 12/13/22 1134)    ED Course/ Medical Decision Making/ A&P                             Medical Decision Making Amount and/or Complexity of Data Reviewed Labs: ordered. Radiology: ordered.   This patient is a 77 y.o. male who presents to the ED for concern of chest pain, back pain, this involves an extensive number of treatment options, and is a complaint that carries with it a high risk of complications and morbidity. The emergent differential diagnosis prior to evaluation includes, but is not limited to,  ACS, pericarditis, myocarditis, aortic dissection, PE, pneumothorax, esophageal spasm or rupture, chronic angina, pneumonia, bronchitis, GERD, reflux/PUD, biliary disease, pancreatitis, costochondritis, anxiety   This is not an exhaustive differential.   Past Medical  History / Co-morbidities / Social History: history of CVA, GERD, hyperlipidemia, hypertension, CHF, defibrillator in place on [employer]  Additional history: Chart reviewed. Pertinent results include: patient receives care through the [place]. unable to review the records.  Patient has been here twice in the past month for chest pain and has been evaluated with labs and chest x-ray which has been normal.  Physical Exam: Physical exam performed. The pertinent findings include: Patient obviously uncomfortable, TTP of the midsternal chest and upper back.  Lab Tests: I ordered, and personally interpreted labs.  The  pertinent results include:  hgb 9.1 down from 11.2 1 month ago. Creatinine 1.81 up from 1.43 1 month ago. Troponin 27 --> 30, patients trops are always mildly elevated   Imaging Studies: I ordered imaging studies including CXR, CTA dissection. I independently visualized and interpreted imaging which showed   CXR: Low lung volumes. No acute cardiopulmonary findings.   CTA:  Mild mediastinal adenopathy is noted as well as extensive periaortic and right iliac adenopathy is noted most consistent with metastatic disease or possibly lymphoma. Right inguinal adenopathy is noted as well. Please see above for measurements.   Also noted are diffuse sclerotic densities throughout the visualized skeleton consistent with osseous metastases.   Mild right hydroureteronephrosis is noted which appears to be due to occlusion from irregular mass involving the posterior portion of the urinary bladder in the region of the right ureterovesical junction. It is uncertain if this mass represents primary bladder cancer, or potentially may represent extension of prostate cancer. Cystoscopy is recommended for further evaluation.   No definite evidence of thoracic or abdominal aortic dissection or aneurysm.  I agree with the radiologist interpretation.   Cardiac Monitoring:  The patient was maintained on a cardiac  monitor.  My attending physician Dr. Estell Harpin viewed and interpreted the cardiac monitored which showed an underlying rhythm of: sinus rhythm. I agree with this interpretation.   Medications: I ordered medication including morphine  for pain. Reevaluation of the patient after these medicines showed that the patient improved. I have reviewed the patients home medicines and have made adjustments as needed.  Consultations Obtained: I requested consultation with the oncology on call Dr. Ellin Saba,  and discussed lab and imaging findings as well as pertinent plan - they recommend: patient requires admission for pain control, they will see the patient in consultation.   Disposition: After consideration of the diagnostic results and the patients response to treatment, I feel that patient requires admission for intractable pain due to likely new metastatic cancer. I have discussed his diagnoses with the patient and his wife present at the bedside who expressed understanding and is in agreement with plan. Patient states that his pain is severe and is requesting admission.   Discussed patient with hospitalist Dr. Mariea Clonts who accepts patient for admission, recommends transfer for admission to [facility] given intractable bone pain from metastasis, patient will likely need radiation therapy during admission.   I discussed this case with my attending physician Dr. Estell Harpin who cosigned this note including patient's presenting symptoms, physical exam, and planned diagnostics and interventions. Attending physician stated agreement with plan or made changes to plan which were implemented.    Final Clinical Impression(s) / ED Diagnoses Final diagnoses:  Metastatic malignant neoplasm, unspecified site [facility])  Other chest pain  Acute midline thoracic back pain  Intractable pain    Rx / DC Orders ED Discharge Orders     None         Vear Clock 12/13/22 1431    Bethann Berkshire, MD 12/16/22  1609

## 2022-12-13 NOTE — H&P (Signed)
Patient Demographics:    Luis Donovan, is a 77 y.o. male  MRN: [id number]   DOB - 23-Jun-1945  Admit Date - 12/13/2022  Outpatient Primary MD for the patient is [facility]   Assessment & Plan:   Assessment and Plan: 1)Possible Metastatic Cancer--- clinical presentation and imaging studies suggest possible prostate Versus Bladder  malignancy with mets -Discussed with on-call urologist Dr. Wilkie Aye -Keep patient n.p.o. 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,  -Patient has chest wall pain, but No frank angina Troponin 27 >> 30, EKG sinus rhythm without acute findings -Apixaban on hold to allow for possible biopsies -Continue atorvastatin  4) chronic anemia --- suspect related to CKD  -Patient is on Eliquis, no active bleeding noted  -hemoglobin is 9.1, monitor closely   5)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 -Continue Abilify,, Prozac and trazodone  6) hyponatremia--in setting of poor oral intake and diuretic use -Hold Lasix -Gentle hydration with IV fluids  7) back and chest wall pain--- suspect due to #1 above with mets to the bone -chest wall and back pain  for several weeks and was treated with nonsteroidal  anti-inflammatory agents and prednisone without significant relief -Over the last 24 to 48 hours chest wall and back pain" particularly worse -No falls/no trauma -Palliative care consult for pain management  8)history of prior stroke in 2015----continue atorvastatin -Hold Eliquis to allow for possible biopsies as above #1  9)Class 2 Obesity/OSA----CPAP at bedtime -Low calorie diet, portion control and increase physical activity discussed with patient -Body mass index is 34.21 kg/m.  10)GERD--continue PPI  Status is: Inpatient  Remains inpatient appropriate because:   Dispo: The patient is from: Home              Anticipated d/c is to: Home              Anticipated d/c date is: > 3 days              Patient currently is not medically stable to d/c. Barriers: Not Clinically Stable-   With History of - Reviewed by me  Past Medical History:  Diagnosis Date   Anemia    CVA (cerebral vascular accident) (HCC) 07/17/2013   Empyema (HCC)    Erectile dysfunction due to arterial insufficiency    Essential (primary) hypertension    Gastro-esophageal reflux disease with esophagitis    Head injury    Heart disease    Hyperlipidemia,  unspecified    Hypertension    Obesity, unspecified    Pneumonia    Post-traumatic stress disorder, unspecified    Sleep apnea, unspecified       Past Surgical History:  Procedure Laterality Date   CARDIAC DEFIBRILLATOR PLACEMENT     LUNG SURGERY     operation on skin of neck     ROTATOR CUFF REPAIR Left    TENDON REPAIR     VIDEO ASSISTED THORACOSCOPY     Chief Complaint  Patient presents with   Chest Pain   Back Pain      HPI:    Luis Donovan  is a 77 y.o. 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, GERD, HTN, obesity/OSA, chronic anticoagulation presents history of PTSD presents to the ED with worsening chest  wall and back pain  for several weeks and was treated with nonsteroidal anti-inflammatory agents and prednisone without significant relief -Over the last 24 to 48 hours chest wall and back pain" particularly worse -No falls/no trauma -No fever  Or chills  -Additional history obtained from patient's wife at bedside  No Nausea, Vomiting or Diarrhea Additional history obtained from patient's wife at bedside WBC 6.3 he moglobin is 9.1, platelets 357 -Sodium is 131, creatinine is 1.84 with a baseline usually around 1.4 -Alkaline phosphatase 469 otherwise LFTs unremarkable Troponin 27 >> 30, EKG sinus rhythm without acute findings CTA chest abdomen and pelvis shows:- -Mild mediastinal adenopathy is noted as well as extensive periaortic and right iliac adenopathy is noted most consistent with metastatic disease or possibly lymphoma. Right inguinal adenopathy is noted as well. Please see above for measurements.  Also noted are diffuse sclerotic densities throughout the visualized skeleton consistent with osseous metastases.  Mild right hydroureteronephrosis is noted which appears to be due to occlusion from irregular mass involving the posterior portion of the urinary bladder in the region of the right ureterovesical junction. It is uncertain if this mass represents primary bladder cancer, or potentially may represent extension of prostate cancer. Cystoscopy is recommended for further evaluation.   Review of systems:    In addition to the HPI above,   A full Review of  Systems was done, all other systems reviewed are negative except as noted above in HPI , .    Social History:  Reviewed by me    Social History   Tobacco Use   Smoking status: Former   Smokeless tobacco: Never  Substance Use Topics   Alcohol use: No    Family History :  Reviewed by me   History reviewed. No pertinent family history.  Home Medications:   Prior to Admission medications   Medication Sig Start  Date End Date Taking? Authorizing Provider  albuterol (VENTOLIN HFA) 108 (90 Base) MCG/ACT inhaler Inhale 2 puffs into the lungs every 4 (four) hours as needed for wheezing or shortness of breath. 07/16/22  Yes [provider]  apixaban (ELIQUIS) 5 MG TABS tablet Take 5 mg by mouth 2 (two) times daily. 02/08/22  Yes [provider]  ARIPiprazole (ABILIFY) 2 MG tablet Take 2 mg by mouth daily. 12/03/21  Yes [provider]  atorvastatin (LIPITOR) 80 MG tablet Take 40 mg by mouth daily. 06/04/22  Yes [provider]  Cholecalciferol 50 MCG (2000 UT) TABS Take 1 tablet by mouth every morning. 07/16/22  Yes [provider]  clotrimazole (LOTRIMIN) 1 % cream Apply 1 Application topically 2 (two) times daily.   Yes [provider]  cyanocobalamin (VITAMIN B12) 500 MCG tablet Take 1 tablet by mouth daily. 07/16/22  Yes [provider]  diclofenac Sodium (VOLTAREN) 1 % GEL Apply 2 g topically 4 (four) times daily as needed (pain). 07/16/22  Yes [provider]  FLUoxetine (PROZAC) 20 MG capsule Take 80 mg by mouth daily. 03/03/22  Yes [provider]  furosemide (LASIX) 20 MG tablet Take 60 mg by mouth daily. 08/27/22  Yes [provider]  hydrALAZINE (APRESOLINE) 50 MG tablet Take 50 mg by mouth 3 (three) times daily. 07/16/22  Yes [provider]  ibuprofen (ADVIL,MOTRIN) 200 MG tablet Take 200 mg by mouth every 6 (six) hours as needed for moderate pain. For pain   Yes [provider]  KETOTIFEN FUMARATE OP Apply 1 drop to eye 2 (two) times daily as needed (allergies). 0.025%   Yes [provider]  latanoprost (XALATAN) 0.005 % ophthalmic solution Place 1 drop into both eyes at bedtime.   Yes [provider]  lidocaine (LIDODERM) 5 % Place 1 patch onto the skin daily. 07/16/22  Yes [provider]  losartan (COZAAR) 100 MG tablet Take 100 mg by mouth daily. 06/04/22  Yes [provider]  metoprolol succinate (TOPROL-XL) 50 MG 24 hr tablet Take 0.5 tablets by mouth daily. 06/04/22  Yes [provider]  omeprazole (PRILOSEC) 20 MG capsule Take 20 mg by mouth daily.   Yes [provider]  torsemide (DEMADEX) 20 MG tablet Take by mouth. 11/05/22  Yes [provider]  traZODone (DESYREL) 50 MG tablet Take 50 mg by mouth at bedtime. 02/16/13  Yes [provider]  predniSONE (DELTASONE) 10 MG tablet Take by mouth. Patient not taking: Reported on 12/13/2022 11/16/22   [provider]  timolol (TIMOPTIC-XR) 0.5 % ophthalmic gel-forming Apply 1 drop to eye daily. 08/13/22   [provider]     Allergies:    No Known Allergies   Physical Exam:   Vitals  Blood pressure (!) 151/100, pulse 79, temperature 98 F (36.7 C), temperature source Oral, resp. rate 20, height 5\' 8"  (1.727 m), weight 102.1 kg, SpO2 95%.  Physical Examination: General appearance - alert,  in no distress  Mental status - alert, oriented to person, place, and time,  Eyes - sclera anicteric Neck - supple, no JVD elevation , Chest - clear  to auscultation bilaterally, symmetrical air movement, reproducible chest wall tenderness Heart - S1 and S2 normal, regular , left chest wall with AICD in situ Abdomen - soft, nontender, nondistended, +BS Neurological - screening mental status exam normal, neck supple without rigidity, cranial nerves II through XII intact, DTR's normal and symmetric Extremities - no significant pedal edema noted, intact peripheral pulses  Skin - warm, dry MSK-reproducible lumbar area tenderness without step deformity or crepitus    Data Review:    CBC Recent Labs  Lab 12/13/22 0955  WBC 6.3  HGB 9.1*  HCT 29.0*  PLT 347  MCV 96.0  MCH 30.1  MCHC 31.4  RDW 13.6   ------------------------------------------------------------------------------------------------------------------  Chemistries  Recent Labs  Lab  12/13/22 0955 12/13/22 1324  NA 131*  --   K 4.1  --   CL 98  --   CO2 27  --   GLUCOSE 129*  --   BUN 46*  --   CREATININE 1.81*  --   CALCIUM 8.6*  --  AST  --  26  ALT  --  27  ALKPHOS  --  469*  BILITOT  --  0.6   ------------------------------------------------------------------------------------------------------------------ estimated creatinine clearance is 41.5 mL/min (A) (by C-G formula based on SCr of 1.81 mg/dL (H)). ------------------------------------------------------------------------------------------------------------------ ------------------------------------------------------------------------------------------------------------------    Component Value Date/Time   BNP 386.0 (H) 11/03/2022 0943   Urinalysis    Component Value Date/Time   COLORURINE YELLOW 09/18/2010 1918   APPEARANCEUR CLOUDY (A) 09/18/2010 1918   LABSPEC 1.020 09/18/2010 1918   PHURINE 5.5 09/18/2010 1918   GLUCOSEU NEGATIVE 09/18/2010 1918   HGBUR TRACE (A) 09/18/2010 1918   BILIRUBINUR NEGATIVE 09/18/2010 1918   KETONESUR NEGATIVE 09/18/2010 1918   PROTEINUR 30 (A) 09/18/2010 1918   UROBILINOGEN 1.0 09/18/2010 1918   NITRITE NEGATIVE 09/18/2010 1918   LEUKOCYTESUR NEGATIVE 09/18/2010 1918   ---------------------------------------------------------------------------------------------------------------   Imaging Results:    CT Angio Chest/Abd/Pel for Dissection W and/or Wo Contrast  Result Date: 12/13/2022 CLINICAL DATA:  Chest pain.  Back pain. EXAM: CT ANGIOGRAPHY CHEST, ABDOMEN AND PELVIS TECHNIQUE: Non-contrast CT of the chest was initially obtained. Multidetector CT imaging through the chest, abdomen and pelvis was performed using the standard protocol during bolus administration of intravenous contrast. Multiplanar reconstructed images and MIPs were obtained and reviewed to evaluate the vascular anatomy. RADIATION DOSE REDUCTION: This exam was performed according to the  departmental dose-optimization program which includes automated exposure control, adjustment of the mA and/or kV according to patient size and/or use of iterative reconstruction technique. CONTRAST:  80mL OMNIPAQUE IOHEXOL 350 MG/ML SOLN COMPARISON:  September 15, 2010. FINDINGS: CTA CHEST FINDINGS Cardiovascular: Atherosclerosis of thoracic aorta is noted without aneurysm or dissection. Normal cardiac size. No pericardial effusion. Coronary artery calcifications are noted. Mediastinum/Nodes: Thyroid gland and esophagus are unremarkable. Multiple enlarged lymph nodes are seen along the left side of the superior mediastinum, with the largest measuring 12 mm. Lungs/Pleura: No pneumothorax or pleural effusion is noted. Minimal emphysematous disease is noted in the lower lobes. Minimal bibasilar scarring is noted as well. Musculoskeletal: Diffuse sclerotic densities are noted throughout the thoracic spine and ribs and sternum consistent with osseous metastases. Review of the MIP images confirms the above findings. CTA ABDOMEN AND PELVIS FINDINGS VASCULAR Aorta: Atherosclerosis of abdominal aorta is noted without aneurysm or dissection. Celiac: Patent without evidence of aneurysm, dissection, vasculitis or significant stenosis. SMA: Patent without evidence of aneurysm, dissection, vasculitis or significant stenosis. Renals: Not well visualized due to probable motion artifact. IMA: Not well visualized due to possible motion artifact. Inflow: Patent without evidence of aneurysm, dissection, vasculitis or significant stenosis. Veins: No obvious venous abnormality within the limitations of this arterial phase study. Review of the MIP images confirms the above findings. NON-VASCULAR Hepatobiliary: No focal liver abnormality is seen. No gallstones, gallbladder wall thickening, or biliary dilatation. Pancreas: Unremarkable. No pancreatic ductal dilatation or surrounding inflammatory changes. Spleen: Normal in size without focal  abnormality. Adrenals/Urinary Tract: Adrenal glands appear normal. Mild right hydroureteronephrosis is noted without obstructing calculus. This appears to be due to irregular mass measuring 4.0 x 2.6 cm involving the right and posterior aspect of the urinary bladder near the right ureterovesical junction. This is concerning for malignancy. Stomach/Bowel: Stomach is within normal limits. Appendix appears normal. No evidence of bowel wall thickening, distention, or inflammatory changes. Diffuse colonic diverticulosis is noted without inflammation. Lymphatic: Extensive retroperitoneal adenopathy is noted, with largest lymph node measuring 2 cm in left periaortic region. Right common and external iliac adenopathy is noted as well.  Largest lymph node measures 2.1 cm in right external iliac region. Left common iliac lymph node is noted measuring 15 mm. 13 mm right inguinal lymph node is noted. Reproductive: Mild prostatic enlargement is noted. It appears to extend into inferior portion of urinary bladder. Other: No abdominal wall hernia or abnormality. No abdominopelvic ascites. Musculoskeletal: Multiple sclerotic densities are noted in the lumbar spine, pelvis and visualized femurs consistent with metastatic disease. Review of the MIP images confirms the above findings. IMPRESSION: Mild mediastinal adenopathy is noted as well as extensive periaortic and right iliac adenopathy is noted most consistent with metastatic disease or possibly lymphoma. Right inguinal adenopathy is noted as well. Please see above for measurements. Also noted are diffuse sclerotic densities throughout the visualized skeleton consistent with osseous metastases. Mild right hydroureteronephrosis is noted which appears to be due to occlusion from irregular mass involving the posterior portion of the urinary bladder in the region of the right ureterovesical junction. It is uncertain if this mass represents primary bladder cancer, or potentially may  represent extension of prostate cancer. Cystoscopy is recommended for further evaluation. No definite evidence of thoracic or abdominal aortic dissection or aneurysm. Aortic Atherosclerosis (ICD10-I70.0). Electronically Signed   By: Lupita Raider M.D.   On: 12/13/2022 12:13   DG Chest Port 1 View  Result Date: 12/13/2022 CLINICAL DATA:  [id number] Chest pain [id number] EXAM: PORTABLE CHEST 1 VIEW COMPARISON:  11/03/2022 FINDINGS: Left-sided implanted cardiac device, stable. Slight prominence of the cardiac silhouette is likely accentuated by low lung volumes. Aortic atherosclerosis. No focal airspace consolidation, pleural effusion, or pneumothorax. Reverse left shoulder arthroplasty. 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 IMPRESSION: Low lung volumes. No acute cardiopulmonary findings. Electronically Signed   By: Duanne Guess D.O.   On: 12/13/2022 10:45    Radiological Exams on Admission: CT Angio Chest/Abd/Pel for Dissection W and/or Wo Contrast  Result Date: 12/13/2022 CLINICAL DATA:  Chest pain.  Back pain. EXAM: CT ANGIOGRAPHY CHEST, ABDOMEN AND PELVIS TECHNIQUE: Non-contrast CT of the chest was initially obtained. Multidetector CT imaging through the chest, abdomen and pelvis was performed using the standard protocol during bolus administration of intravenous contrast. Multiplanar reconstructed images and MIPs were obtained and reviewed to evaluate the vascular anatomy. RADIATION DOSE REDUCTION: This exam was performed according to the departmental dose-optimization program which includes automated exposure control, adjustment of the mA and/or kV according to patient size and/or use of iterative reconstruction technique. CONTRAST:  80mL OMNIPAQUE IOHEXOL 350 MG/ML SOLN COMPARISON:  September 15, 2010. FINDINGS: CTA CHEST FINDINGS Cardiovascular: Atherosclerosis of thoracic aorta is noted without aneurysm or dissection. Normal cardiac size. No pericardial effusion. Coronary artery  calcifications are noted. Mediastinum/Nodes: Thyroid gland and esophagus are unremarkable. Multiple enlarged lymph nodes are seen along the left side of the superior mediastinum, with the largest measuring 12 mm. Lungs/Pleura: No pneumothorax or pleural effusion is noted. Minimal emphysematous disease is noted in the lower lobes. Minimal bibasilar scarring is noted as well. Musculoskeletal: Diffuse sclerotic densities are noted throughout the thoracic spine and ribs and sternum consistent with osseous metastases. Review of the MIP images confirms the above findings. CTA ABDOMEN AND PELVIS FINDINGS VASCULAR Aorta: Atherosclerosis of abdominal aorta is noted without aneurysm or dissection. Celiac: Patent without evidence of aneurysm, dissection, vasculitis or significant stenosis. SMA: Patent without evidence of aneurysm, dissection, vasculitis or significant stenosis. Renals: Not well visualized due to probable motion artifact. IMA: Not well visualized due to possible motion artifact. Inflow: Patent without evidence of aneurysm,  dissection, vasculitis or significant stenosis. Veins: No obvious venous abnormality within the limitations of this arterial phase study. Review of the MIP images confirms the above findings. NON-VASCULAR Hepatobiliary: No focal liver abnormality is seen. No gallstones, gallbladder wall thickening, or biliary dilatation. Pancreas: Unremarkable. No pancreatic ductal dilatation or surrounding inflammatory changes. Spleen: Normal in size without focal abnormality. Adrenals/Urinary Tract: Adrenal glands appear normal. Mild right hydroureteronephrosis is noted without obstructing calculus. This appears to be due to irregular mass measuring 4.0 x 2.6 cm involving the right and posterior aspect of the urinary bladder near the right ureterovesical junction. This is concerning for malignancy. Stomach/Bowel: Stomach is within normal limits. Appendix appears normal. No evidence of bowel wall thickening,  distention, or inflammatory changes. Diffuse colonic diverticulosis is noted without inflammation. Lymphatic: Extensive retroperitoneal adenopathy is noted, with largest lymph node measuring 2 cm in left periaortic region. Right common and external iliac adenopathy is noted as well. Largest lymph node measures 2.1 cm in right external iliac region. Left common iliac lymph node is noted measuring 15 mm. 13 mm right inguinal lymph node is noted. Reproductive: Mild prostatic enlargement is noted. It appears to extend into inferior portion of urinary bladder. Other: No abdominal wall hernia or abnormality. No abdominopelvic ascites. Musculoskeletal: Multiple sclerotic densities are noted in the lumbar spine, pelvis and visualized femurs consistent with metastatic disease. Review of the MIP images confirms the above findings. IMPRESSION: Mild mediastinal adenopathy is noted as well as extensive periaortic and right iliac adenopathy is noted most consistent with metastatic disease or possibly lymphoma. Right inguinal adenopathy is noted as well. Please see above for measurements. Also noted are diffuse sclerotic densities throughout the visualized skeleton consistent with osseous metastases. Mild right hydroureteronephrosis is noted which appears to be due to occlusion from irregular mass involving the posterior portion of the urinary bladder in the region of the right ureterovesical junction. It is uncertain if this mass represents primary bladder cancer, or potentially may represent extension of prostate cancer. Cystoscopy is recommended for further evaluation. No definite evidence of thoracic or abdominal aortic dissection or aneurysm. Aortic Atherosclerosis (ICD10-I70.0). Electronically Signed   By: Lupita Raider M.D.   On: 12/13/2022 12:13   DG Chest Port 1 View  Result Date: 12/13/2022 CLINICAL DATA:  [id number] Chest pain [id number] EXAM: PORTABLE CHEST 1 VIEW COMPARISON:  11/03/2022 FINDINGS: Left-sided implanted  cardiac device, stable. Slight prominence of the cardiac silhouette is likely accentuated by low lung volumes. Aortic atherosclerosis. No focal airspace consolidation, pleural effusion, or pneumothorax. Reverse left shoulder arthroplasty. 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 IMPRESSION: Low lung volumes. No acute cardiopulmonary findings. Electronically Signed   By: Duanne Guess D.O.   On: 12/13/2022 10:45    DVT Prophylaxis -SCD /Eliquis is on Hold AM Labs Ordered, also please review Full Orders  Family Communication: Admission, patients condition and plan of care including tests being ordered have been discussed with the patient and wife Meriam Sprague at bedside who indicate understanding and agree with the plan  Condition  -stable  Shon Hale M.D on 12/13/2022 at 3:43 PM Go to [email or web address] -  for contact info  [facility] - Office  [phone]

## 2022-12-13 NOTE — ED Triage Notes (Signed)
Pt c/o chest pain , n/v, heartburn  for a few weeks but a few days ago his upper left back began hurting while he breathes. Pt states he also has started a metallic taste in his mouth and smelling odors his wife doesn't smell. Pt also c/o his throat is sore x 3 days.

## 2022-12-14 DIAGNOSIS — C7951 Secondary malignant neoplasm of bone: Secondary | ICD-10-CM | POA: Diagnosis not present

## 2022-12-14 MED ORDER — POLYETHYLENE GLYCOL 3350 17 G PO PACK
17.0000 g | PACK | Freq: Two times a day (BID) | ORAL | Status: DC
  Administered 2022-12-14 – 2022-12-15 (×2): 17 g via ORAL
  Filled 2022-12-14 (×2): qty 1

## 2022-12-14 MED ORDER — DEXAMETHASONE SODIUM PHOSPHATE 10 MG/ML IJ SOLN
8.0000 mg | Freq: Once | INTRAMUSCULAR | Status: AC
  Administered 2022-12-14: 8 mg via INTRAVENOUS
  Filled 2022-12-14: qty 1

## 2022-12-14 MED ORDER — OXYCODONE HCL 5 MG PO TABS
10.0000 mg | ORAL_TABLET | ORAL | Status: DC | PRN
  Administered 2022-12-14 – 2022-12-15 (×5): 10 mg via ORAL
  Filled 2022-12-14 (×5): qty 2

## 2022-12-14 MED ORDER — ZOLEDRONIC ACID 4 MG/100ML IV SOLN
4.0000 mg | Freq: Once | INTRAVENOUS | Status: AC
  Administered 2022-12-14: 4 mg via INTRAVENOUS
  Filled 2022-12-14: qty 100

## 2022-12-14 NOTE — Progress Notes (Signed)
Mobility Specialist - Progress Note   12/14/22 1320  Mobility  Activity Ambulated with assistance in hallway  Level of Assistance Modified independent, requires aide device or extra time  Assistive Device Cane  Distance Ambulated (ft) 80 ft  Activity Response Tolerated well  Mobility Referral Yes  $Mobility charge 1 Mobility  Mobility Specialist Start Time (ACUTE ONLY) 0111  Mobility Specialist Stop Time (ACUTE ONLY) 0119  Mobility Specialist Time Calculation (min) (ACUTE ONLY) 8 min   Pt received in bed and agreeable to mobility. Distance limited due to back pain. Pt to bed after session with all needs met.    [facility]

## 2022-12-14 NOTE — Plan of Care (Signed)
  Problem: Pain Managment: Goal: General experience of comfort will improve Outcome: Progressing   Problem: Education: Goal: Knowledge of General Education information will improve Description: Including pain rating scale, medication(s)/side effects and non-pharmacologic comfort measures Outcome: Progressing   Problem: Coping: Goal: Level of anxiety will decrease Outcome: Progressing

## 2022-12-14 NOTE — Progress Notes (Signed)
PROGRESS NOTE    Luis Donovan  ZOX:[id number] DOB: 04/14/1946 DOA: 12/13/2022 PCP: [facility]    Brief Narrative:  77 year old veteran, chronic anemia of CKD, prior stroke, prior MI and V-fib cardiac arrest status post PCI, GERD, hypertension, obesity, chronic anticoagulation with Eliquis and history of PTSD presented to the emergency room with worsening chest wall and back pain for several weeks.  In the emergency room, he was found with creatinine 1.84 with recent baseline of 1.4.  CT scan of the chest abdomen and pelvis showed mild adenopathy in the chest, extensive periaortic and right iliac adenopathy, right inguinal adenopathy, diffuse sclerotic density throughout the visualized skeleton consistent with osseous metastasis.  He was also noted to have mild right hydro and ureteral nephrosis due to occlusion from irregular mass involving the posterior portion of the urinary bladder in the region of right ureterovesical junction. Case was discussed with urology and was transferred to [facility] from [facility] for procedure, biopsy.   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 N.p.o., holding Eliquis.  Anticipating procedure. Adequate pain control, will increase dose of oxycodone, add stool softener and increase dose of MiraLAX twice daily.  AKI on CKD stage IIIa: Baseline creatinine about 1.4.  No evidence of urinary retention.  Holding losartan and Lasix.  Gentle IV fluids.  Creatinine trending down.  Coronary artery disease: Troponins flat.  EKG without acute changes.  On atorvastatin.  Apixaban on hold.  PTSD: On Abilify, Prozac and trazodone.  Continued.  GERD: PPI.   DVT prophylaxis: SCDs Start: 12/13/22 1452 Place TED hose Start: 12/13/22 1452 apixaban (ELIQUIS) tablet 5 mg   Code Status: Full code Family Communication: None at bedside Disposition Plan: Status is: Inpatient Remains inpatient  appropriate because: Inpatient procedures planned, significant pain     Consultants:  Urology  Procedures:  None   Antimicrobials:  None   Subjective: Patient seen and examined in the morning rounds.  He was having a spasmodic back pain that is radiating to the front of the chest.  On room air.  Unsure about the procedures planned. We decided to go up on pain medications. Radiation is likely not possible before making a diagnosis.  Will discuss with radiation oncology.    Objective: Vitals:   12/13/22 2046 12/14/22 0147 12/14/22 0504 12/14/22 0858  BP: (!) 174/76 (!) 174/83 133/86 (!) 182/99  Pulse: 87 83 83 84  Resp: 18 18 17 17   Temp: 99.2 F (37.3 C) 98.6 F (37 C) 98.7 F (37.1 C) 97.7 F (36.5 C)  TempSrc: Oral Oral Oral Oral  SpO2: 99% 96% 98% 99%  Weight:      Height:        Intake/Output Summary (Last 24 hours) at 12/14/2022 1143 Last data filed at 12/14/2022 0900 Gross per 24 hour  Intake 1901.44 ml  Output 850 ml  Net 1051.44 ml   Filed Weights   12/13/22 0933  Weight: 102.1 kg    Examination:  General exam: Appears in moderate distress.  Uncomfortable with the spasmodic pain.  Anxious. Respiratory system: Clear to auscultation. Respiratory effort normal. Cardiovascular system: S1 & S2 heard, Gastrointestinal system: Soft.  Nontender.  Bowel sound present.  Obese and pendulous. Central nervous system: Alert and oriented. No focal neurological deficits. Extremities: Symmetric 5 x 5 power.    Data Reviewed: I have personally reviewed following labs and imaging studies  CBC: Recent Labs  Lab 12/13/22 0955 12/14/22 0421  WBC  6.3 6.4  HGB 9.1* 8.7*  HCT 29.0* 27.9*  MCV 96.0 97.2  PLT 347 357   Basic Metabolic Panel: Recent Labs  Lab 12/13/22 0955 12/14/22 0421  NA 131* 135  K 4.1 4.1  CL 98 100  CO2 27 24  GLUCOSE 129* 122*  BUN 46* 42*  CREATININE 1.81* 1.53*  CALCIUM 8.6* 8.6*   GFR: Estimated Creatinine Clearance: 49.1  mL/min (A) (by C-G formula based on SCr of 1.53 mg/dL (H)). Liver Function Tests: Recent Labs  Lab 12/13/22 1324  AST 26  ALT 27  ALKPHOS 469*  BILITOT 0.6  PROT 7.6  ALBUMIN 3.2*   No results for input(s): "LIPASE", "AMYLASE" in the last 168 hours. No results for input(s): "AMMONIA" in the last 168 hours. Coagulation Profile: No results for input(s): "INR", "PROTIME" in the last 168 hours. Cardiac Enzymes: No results for input(s): "CKTOTAL", "CKMB", "CKMBINDEX", "TROPONINI" in the last 168 hours. BNP (last 3 results) No results for input(s): "PROBNP" in the last 8760 hours. HbA1C: No results for input(s): "HGBA1C" in the last 72 hours. CBG: No results for input(s): "GLUCAP" in the last 168 hours. Lipid Profile: No results for input(s): "CHOL", "HDL", "LDLCALC", "TRIG", "CHOLHDL", "LDLDIRECT" in the last 72 hours. Thyroid Function Tests: No results for input(s): "TSH", "T4TOTAL", "FREET4", "T3FREE", "THYROIDAB" in the last 72 hours. Anemia Panel: No results for input(s): "VITAMINB12", "FOLATE", "FERRITIN", "TIBC", "IRON", "RETICCTPCT" in the last 72 hours. Sepsis Labs: No results for input(s): "PROCALCITON", "LATICACIDVEN" in the last 168 hours.  No results found for this or any previous visit (from the past 240 hour(s)).       Radiology Studies: CT Angio Chest/Abd/Pel for Dissection W and/or Wo Contrast  Result Date: 12/13/2022 CLINICAL DATA:  Chest pain.  Back pain. EXAM: CT ANGIOGRAPHY CHEST, ABDOMEN AND PELVIS TECHNIQUE: Non-contrast CT of the chest was initially obtained. Multidetector CT imaging through the chest, abdomen and pelvis was performed using the standard protocol during bolus administration of intravenous contrast. Multiplanar reconstructed images and MIPs were obtained and reviewed to evaluate the vascular anatomy. RADIATION DOSE REDUCTION: This exam was performed according to the departmental dose-optimization program which includes automated exposure  control, adjustment of the mA and/or kV according to patient size and/or use of iterative reconstruction technique. CONTRAST:  80mL OMNIPAQUE IOHEXOL 350 MG/ML SOLN COMPARISON:  September 15, 2010. FINDINGS: CTA CHEST FINDINGS Cardiovascular: Atherosclerosis of thoracic aorta is noted without aneurysm or dissection. Normal cardiac size. No pericardial effusion. Coronary artery calcifications are noted. Mediastinum/Nodes: Thyroid gland and esophagus are unremarkable. Multiple enlarged lymph nodes are seen along the left side of the superior mediastinum, with the largest measuring 12 mm. Lungs/Pleura: No pneumothorax or pleural effusion is noted. Minimal emphysematous disease is noted in the lower lobes. Minimal bibasilar scarring is noted as well. Musculoskeletal: Diffuse sclerotic densities are noted throughout the thoracic spine and ribs and sternum consistent with osseous metastases. Review of the MIP images confirms the above findings. CTA ABDOMEN AND PELVIS FINDINGS VASCULAR Aorta: Atherosclerosis of abdominal aorta is noted without aneurysm or dissection. Celiac: Patent without evidence of aneurysm, dissection, vasculitis or significant stenosis. SMA: Patent without evidence of aneurysm, dissection, vasculitis or significant stenosis. Renals: Not well visualized due to probable motion artifact. IMA: Not well visualized due to possible motion artifact. Inflow: Patent without evidence of aneurysm, dissection, vasculitis or significant stenosis. Veins: No obvious venous abnormality within the limitations of this arterial phase study. Review of the MIP images confirms the above findings. NON-VASCULAR Hepatobiliary:  No focal liver abnormality is seen. No gallstones, gallbladder wall thickening, or biliary dilatation. Pancreas: Unremarkable. No pancreatic ductal dilatation or surrounding inflammatory changes. Spleen: Normal in size without focal abnormality. Adrenals/Urinary Tract: Adrenal glands appear normal. Mild  right hydroureteronephrosis is noted without obstructing calculus. This appears to be due to irregular mass measuring 4.0 x 2.6 cm involving the right and posterior aspect of the urinary bladder near the right ureterovesical junction. This is concerning for malignancy. Stomach/Bowel: Stomach is within normal limits. Appendix appears normal. No evidence of bowel wall thickening, distention, or inflammatory changes. Diffuse colonic diverticulosis is noted without inflammation. Lymphatic: Extensive retroperitoneal adenopathy is noted, with largest lymph node measuring 2 cm in left periaortic region. Right common and external iliac adenopathy is noted as well. Largest lymph node measures 2.1 cm in right external iliac region. Left common iliac lymph node is noted measuring 15 mm. 13 mm right inguinal lymph node is noted. Reproductive: Mild prostatic enlargement is noted. It appears to extend into inferior portion of urinary bladder. Other: No abdominal wall hernia or abnormality. No abdominopelvic ascites. Musculoskeletal: Multiple sclerotic densities are noted in the lumbar spine, pelvis and visualized femurs consistent with metastatic disease. Review of the MIP images confirms the above findings. IMPRESSION: Mild mediastinal adenopathy is noted as well as extensive periaortic and right iliac adenopathy is noted most consistent with metastatic disease or possibly lymphoma. Right inguinal adenopathy is noted as well. Please see above for measurements. Also noted are diffuse sclerotic densities throughout the visualized skeleton consistent with osseous metastases. Mild right hydroureteronephrosis is noted which appears to be due to occlusion from irregular mass involving the posterior portion of the urinary bladder in the region of the right ureterovesical junction. It is uncertain if this mass represents primary bladder cancer, or potentially may represent extension of prostate cancer. Cystoscopy is recommended for  further evaluation. No definite evidence of thoracic or abdominal aortic dissection or aneurysm. Aortic Atherosclerosis (ICD10-I70.0). Electronically Signed   By: Lupita Raider M.D.   On: 12/13/2022 12:13   DG Chest Port 1 View  Result Date: 12/13/2022 CLINICAL DATA:  [id number] Chest pain [id number] EXAM: PORTABLE CHEST 1 VIEW COMPARISON:  11/03/2022 FINDINGS: Left-sided implanted cardiac device, stable. Slight prominence of the cardiac silhouette is likely accentuated by low lung volumes. Aortic atherosclerosis. No focal airspace consolidation, pleural effusion, or pneumothorax. Reverse left shoulder arthroplasty. 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 IMPRESSION: Low lung volumes. No acute cardiopulmonary findings. Electronically Signed   By: Duanne Guess D.O.   On: 12/13/2022 10:45        Scheduled Meds:  [START ON 12/15/2022] apixaban  5 mg Oral BID   ARIPiprazole  2 mg Oral Daily   atorvastatin  40 mg Oral Daily   cyanocobalamin  500 mcg Oral Daily   FLUoxetine  80 mg Oral Daily   hydrALAZINE  50 mg Oral TID   methocarbamol  500 mg Oral TID   pantoprazole  40 mg Oral Daily   polyethylene glycol  17 g Oral BID   senna-docusate  2 tablet Oral QHS   sodium chloride flush  3 mL Intravenous Q12H   sodium chloride flush  3 mL Intravenous Q12H   timolol  1 drop Both Eyes QHS   traZODone  50 mg Oral QHS   Continuous Infusions:  sodium chloride 75 mL/hr at 12/14/22 0200   sodium chloride       LOS: 1 day    Time spent: 35 minutes  Dorcas Carrow, MD [facility]

## 2022-12-14 NOTE — TOC Initial Note (Signed)
Transition of Care [facility]) - Initial/Assessment Note   Patient Details  Name: Luis Donovan MRN: [id number] Date of Birth: 1945-10-16  Transition of Care [facility]) CM/SW Contact:    Ewing Schlein, LCSW Phone Number: 12/14/2022, 10:27 AM  Clinical Narrative: TOC following for possible discharge needs.                Expected Discharge Plan: Home/Self Care Barriers to Discharge: Continued Medical Work up  Expected Discharge Plan and Services In-house Referral: Clinical Social Work Living arrangements for the past 2 months: Single Family Home  Prior Living Arrangements/Services Living arrangements for the past 2 months: Single Family Home Lives with:: Spouse Patient language and need for interpreter reviewed:: Yes Need for Family Participation in Patient Care: No (Comment) Care giver support system in place?: Yes (comment) Criminal Activity/Legal Involvement Pertinent to Current Situation/Hospitalization: No - Comment as needed  Emotional Assessment Orientation: : Oriented to Self, Oriented to Place, Oriented to  Time, Oriented to Situation Alcohol / Substance Use: Not Applicable Psych Involvement: No (comment)  Admission diagnosis:  Other chest pain [R07.89] Metastatic cancer (HCC) [C79.9] Intractable pain [R52] Acute midline thoracic back pain [M54.6] Metastatic malignant neoplasm, unspecified site [facility]) [C79.9] Patient Active Problem List   Diagnosis Date Noted   Presumed Metastatic cancer (HCC) 12/13/2022   MI with VFib Arrest in 2005/CAD S/P percutaneous coronary angioplasty 12/13/2022   CKD stage 3a, GFR 45-59 ml/min (HCC) 12/13/2022   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 12/13/2022   Erectile dysfunction due to arterial insufficiency    Essential (primary) hypertension    Gastro-esophageal reflux disease with esophagitis    Hyperlipidemia, unspecified    Obesity, unspecified    Post-traumatic stress disorder, unspecified    Sleep apnea, unspecified    H/o  CVA  (cerebral vascular accident) /2015 07/17/2013   PCP:  [facility] Pharmacy:   [facility] [phone] - [place] - [address] [place] [address] Phone: [phone] Fax: [phone]  [facility] - [place], [place] - [address] [place] [address] Phone: [phone] Fax: [phone]  Social Determinants of Health (SDOH) Social History: SDOH Screenings   Tobacco Use: Medium Risk (12/13/2022)   SDOH Interventions:    Readmission Risk Interventions    12/14/2022   10:25 AM  Readmission Risk Prevention Plan  Transportation Screening Complete  HRI or Home Care Consult Complete  Social Work Consult for Recovery Care Planning/Counseling Complete  Palliative Care Screening Complete  Medication Review [employer]) Complete

## 2022-12-14 NOTE — Progress Notes (Signed)
Subjective: Pt in bed on rounds. Transient shocking pains to the chest. NAEON  Objective: Vital signs in last 24 hours: Temp:  [97.7 F (36.5 C)-99.2 F (37.3 C)] 97.7 F (36.5 C) (07/29 0858) Pulse Rate:  [71-87] 84 (07/29 0858) Resp:  [12-20] 17 (07/29 0858) BP: (133-182)/(71-119) 182/99 (07/29 0858) SpO2:  [94 %-99 %] 99 % (07/29 0858) FiO2 (%):  [21 %] 21 % (07/28 2300) Weight:  [102.1 kg] 102.1 kg (07/28 0933)  Intake/Output from previous day: 07/28 0701 - 07/29 0700 In: 1676.5 [P.O.:610; I.V.:1066.5] Out: 850 [Urine:850]  Intake/Output this shift: No intake/output data recorded.  Physical Exam:  General: Alert and oriented CV: No cyanosis Lungs: equal chest rise Abdomen: Soft, NTND, no rebound or guarding   Lab Results: Recent Labs    12/13/22 0955 12/14/22 0421  HGB 9.1* 8.7*  HCT 29.0* 27.9*   BMET Recent Labs    12/13/22 0955 12/14/22 0421  NA 131* 135  K 4.1 4.1  CL 98 100  CO2 27 24  GLUCOSE 129* 122*  BUN 46* 42*  CREATININE 1.81* 1.53*  CALCIUM 8.6* 8.6*     Studies/Results: CT Angio Chest/Abd/Pel for Dissection W and/or Wo Contrast  Result Date: 12/13/2022 CLINICAL DATA:  Chest pain.  Back pain. EXAM: CT ANGIOGRAPHY CHEST, ABDOMEN AND PELVIS TECHNIQUE: Non-contrast CT of the chest was initially obtained. Multidetector CT imaging through the chest, abdomen and pelvis was performed using the standard protocol during bolus administration of intravenous contrast. Multiplanar reconstructed images and MIPs were obtained and reviewed to evaluate the vascular anatomy. RADIATION DOSE REDUCTION: This exam was performed according to the departmental dose-optimization program which includes automated exposure control, adjustment of the mA and/or kV according to patient size and/or use of iterative reconstruction technique. CONTRAST:  80mL OMNIPAQUE IOHEXOL 350 MG/ML SOLN COMPARISON:  September 15, 2010. FINDINGS: CTA CHEST FINDINGS Cardiovascular:  Atherosclerosis of thoracic aorta is noted without aneurysm or dissection. Normal cardiac size. No pericardial effusion. Coronary artery calcifications are noted. Mediastinum/Nodes: Thyroid gland and esophagus are unremarkable. Multiple enlarged lymph nodes are seen along the left side of the superior mediastinum, with the largest measuring 12 mm. Lungs/Pleura: No pneumothorax or pleural effusion is noted. Minimal emphysematous disease is noted in the lower lobes. Minimal bibasilar scarring is noted as well. Musculoskeletal: Diffuse sclerotic densities are noted throughout the thoracic spine and ribs and sternum consistent with osseous metastases. Review of the MIP images confirms the above findings. CTA ABDOMEN AND PELVIS FINDINGS VASCULAR Aorta: Atherosclerosis of abdominal aorta is noted without aneurysm or dissection. Celiac: Patent without evidence of aneurysm, dissection, vasculitis or significant stenosis. SMA: Patent without evidence of aneurysm, dissection, vasculitis or significant stenosis. Renals: Not well visualized due to probable motion artifact. IMA: Not well visualized due to possible motion artifact. Inflow: Patent without evidence of aneurysm, dissection, vasculitis or significant stenosis. Veins: No obvious venous abnormality within the limitations of this arterial phase study. Review of the MIP images confirms the above findings. NON-VASCULAR Hepatobiliary: No focal liver abnormality is seen. No gallstones, gallbladder wall thickening, or biliary dilatation. Pancreas: Unremarkable. No pancreatic ductal dilatation or surrounding inflammatory changes. Spleen: Normal in size without focal abnormality. Adrenals/Urinary Tract: Adrenal glands appear normal. Mild right hydroureteronephrosis is noted without obstructing calculus. This appears to be due to irregular mass measuring 4.0 x 2.6 cm involving the right and posterior aspect of the urinary bladder near the right ureterovesical junction. This is  concerning for malignancy. Stomach/Bowel: Stomach is within normal  limits. Appendix appears normal. No evidence of bowel wall thickening, distention, or inflammatory changes. Diffuse colonic diverticulosis is noted without inflammation. Lymphatic: Extensive retroperitoneal adenopathy is noted, with largest lymph node measuring 2 cm in left periaortic region. Right common and external iliac adenopathy is noted as well. Largest lymph node measures 2.1 cm in right external iliac region. Left common iliac lymph node is noted measuring 15 mm. 13 mm right inguinal lymph node is noted. Reproductive: Mild prostatic enlargement is noted. It appears to extend into inferior portion of urinary bladder. Other: No abdominal wall hernia or abnormality. No abdominopelvic ascites. Musculoskeletal: Multiple sclerotic densities are noted in the lumbar spine, pelvis and visualized femurs consistent with metastatic disease. Review of the MIP images confirms the above findings. IMPRESSION: Mild mediastinal adenopathy is noted as well as extensive periaortic and right iliac adenopathy is noted most consistent with metastatic disease or possibly lymphoma. Right inguinal adenopathy is noted as well. Please see above for measurements. Also noted are diffuse sclerotic densities throughout the visualized skeleton consistent with osseous metastases. Mild right hydroureteronephrosis is noted which appears to be due to occlusion from irregular mass involving the posterior portion of the urinary bladder in the region of the right ureterovesical junction. It is uncertain if this mass represents primary bladder cancer, or potentially may represent extension of prostate cancer. Cystoscopy is recommended for further evaluation. No definite evidence of thoracic or abdominal aortic dissection or aneurysm. Aortic Atherosclerosis (ICD10-I70.0). Electronically Signed   By: Lupita Raider M.D.   On: 12/13/2022 12:13   DG Chest Port 1 View  Result Date:  12/13/2022 CLINICAL DATA:  [id number] Chest pain [id number] EXAM: PORTABLE CHEST 1 VIEW COMPARISON:  11/03/2022 FINDINGS: Left-sided implanted cardiac device, stable. Slight prominence of the cardiac silhouette is likely accentuated by low lung volumes. Aortic atherosclerosis. No focal airspace consolidation, pleural effusion, or pneumothorax. Reverse left shoulder arthroplasty. 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 IMPRESSION: Low lung volumes. No acute cardiopulmonary findings. Electronically Signed   By: Duanne Guess D.O.   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 Ronne Binning. Pt is local to the [place] office. He will schedule cystoscopy with bladder +/- prostate biopsy there. I have contacted his schedulers directly who are working on his plan now.  Urology will sign off at this time. Please call with questions.    LOS: 1 day   Elmon Kirschner, NP [facility] Pager: [phone]  12/14/2022, 9:14 AM

## 2022-12-14 NOTE — Plan of Care (Signed)

## 2022-12-15 DIAGNOSIS — C7989 Secondary malignant neoplasm of other specified sites: Secondary | ICD-10-CM | POA: Diagnosis not present

## 2022-12-15 MED ORDER — SENNOSIDES-DOCUSATE SODIUM 8.6-50 MG PO TABS: 2.0000 | ORAL_TABLET | Freq: Every day | ORAL | 0 refills | Status: AC

## 2022-12-15 MED ORDER — POLYETHYLENE GLYCOL 3350 17 G PO PACK: 17.0000 g | PACK | Freq: Two times a day (BID) | ORAL | 0 refills | Status: DC

## 2022-12-15 MED ORDER — OXYCODONE HCL 10 MG PO TABS: 10.0000 mg | ORAL_TABLET | Freq: Four times a day (QID) | ORAL | 0 refills | Status: DC | PRN

## 2022-12-15 NOTE — Progress Notes (Signed)
   12/15/22 0013  BiPAP/CPAP/SIPAP  $ Non-Invasive Home Ventilator  Subsequent  BiPAP/CPAP/SIPAP Pt Type Adult  BiPAP/CPAP/SIPAP DREAMSTATIOND  Mask Type Nasal mask  Mask Size Medium  Respiratory Rate 18 breaths/min  FiO2 (%) 21 %  Patient Home Equipment No  Auto Titrate Yes  Nasal massage performed No (comment)  BiPAP/CPAP /SiPAP Vitals  Pulse Rate 80  Resp 18  SpO2 95 %  Bilateral Breath Sounds Clear;Diminished  MEWS Score/Color  MEWS Score 0  MEWS Score Color Luis Donovan

## 2022-12-15 NOTE — Progress Notes (Signed)
Patient discharged prior to palliative care consultation. Recommended decadron 8mg  daily and given single dose of Zometa X 1 to prevent SRE. Recommend he follow up with Outpatient Palliative Care for pain control and symptom management and ACP.  Anderson Malta, DO Palliative Medicine

## 2022-12-15 NOTE — Plan of Care (Signed)
  Problem: Pain Managment: Goal: General experience of comfort will improve Outcome: Progressing   Problem: Health Behavior/Discharge Planning: Goal: Ability to manage health-related needs will improve Outcome: Progressing

## 2022-12-15 NOTE — Progress Notes (Addendum)
Discharge instructions given to patient and all questions were answered.  

## 2022-12-15 NOTE — Discharge Summary (Signed)
Physician Discharge Summary  Luis Donovan AVW:[id number] DOB: 02/27/1946 DOA: 12/13/2022  PCP: [facility]  Admit date: 12/13/2022 Discharge date: 12/15/2022  Admitted From: Home Disposition: Home  Recommendations for Outpatient Follow-up:  Follow up with PCP in 1-2 weeks Procedure rescheduled with urology tomorrow 7/31 in [place].  Discharge Condition: Stable CODE STATUS: Full code Diet recommendation: Low-salt diet, avoid constipation  Discharge summary: 77 year old [place], chronic anemia of CKD, prior stroke, prior MI and V-fib cardiac arrest status post PCI, GERD, hypertension, obesity, chronic anticoagulation with Eliquis and history of PTSD presented to the emergency room with worsening chest wall and back pain for several weeks.  In the emergency room, he was found with creatinine 1.84 with recent baseline of 1.4.  CT scan of the chest abdomen and pelvis showed mild adenopathy in the chest, extensive periaortic and right iliac adenopathy, right inguinal adenopathy, diffuse sclerotic density throughout the visualized skeleton consistent with osseous metastasis.  He was also noted to have mild right hydro and ureteral nephrosis due to occlusion from irregular mass involving the posterior portion of the urinary bladder in the region of right ureterovesical junction. Case was discussed with urology and was transferred to [facility] from [facility] for procedure, biopsy and to manage significant pain.  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  Patient currently remains hemodynamically stable.  Symptoms are controlled.  No urinary symptoms. Patient is a scheduled to undergo cystoscopy and biopsy tomorrow as outpatient with Dr. Ronne Binning in [place]. He was started on multimodal pain medication regimen including lidocaine patch, Voltaren gel, oxycodone 10 mg every 6 hours.  His symptoms are well-controlled  today.  Patient does not have any neurological deficit from his vertebral metastasis.  Patient also has significant constipation issues without complication now.  He will be prescribed stool softener twice daily as well as MiraLAX twice daily until his bowel movements are regulated. Will continue to hold Eliquis as he has procedure scheduled tomorrow.   AKI on CKD stage IIIa: Baseline creatinine about 1.4.  No evidence of urinary retention.  Can resume Lasix and losartan today. creatinine trending down.  Recheck in 1 week.   Coronary artery disease: Troponins flat.  EKG without acute changes.  On atorvastatin.  Apixaban on hold.   PTSD: On Abilify, Prozac and trazodone.  Continued.   GERD: PPI.  Sleep apnea: On CPAP at night.  Patient is medically stable today.  Case discussed with urology, he has planned procedure tomorrow so able to go home today.   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   Essential (primary) hypertension   Obesity, unspecified   Sleep apnea, unspecified    Discharge Instructions  Discharge Instructions     Diet - low sodium heart healthy   Complete by: As directed    Increase activity slowly   Complete by: As directed       Allergies as of 12/15/2022   No Known Allergies      Medication List     STOP taking these medications    apixaban 5 MG Tabs tablet Commonly known as: ELIQUIS   furosemide 20 MG tablet Commonly known as: LASIX   predniSONE 10 MG tablet  Commonly known as: DELTASONE       TAKE these medications    albuterol 108 (90 Base) MCG/ACT inhaler Commonly known as: VENTOLIN HFA Inhale 2 puffs into the lungs every 4 (four) hours as needed for wheezing or shortness of  breath.   ARIPiprazole 2 MG tablet Commonly known as: ABILIFY Take 2 mg by mouth daily.   atorvastatin 80 MG tablet Commonly known as: LIPITOR Take 40 mg by mouth daily.   Cholecalciferol 50 MCG (2000 UT) Tabs Take 1 tablet by mouth every morning.   clotrimazole 1 % cream Commonly known as: LOTRIMIN Apply 1 Application topically 2 (two) times daily.   cyanocobalamin 500 MCG tablet Commonly known as: VITAMIN B12 Take 1 tablet by mouth daily.   diclofenac Sodium 1 % Gel Commonly known as: VOLTAREN Apply 2 g topically 4 (four) times daily as needed (pain).   FLUoxetine 20 MG capsule Commonly known as: PROZAC Take 80 mg by mouth daily.   hydrALAZINE 50 MG tablet Commonly known as: APRESOLINE Take 50 mg by mouth 3 (three) times daily.   ibuprofen 200 MG tablet Commonly known as: ADVIL Take 200 mg by mouth every 6 (six) hours as needed for moderate pain. For pain   KETOTIFEN FUMARATE OP Apply 1 drop to eye 2 (two) times daily as needed (allergies). 0.025%   latanoprost 0.005 % ophthalmic solution Commonly known as: XALATAN Place 1 drop into both eyes at bedtime.   lidocaine 5 % Commonly known as: LIDODERM Place 1 patch onto the skin daily.   losartan 100 MG tablet Commonly known as: COZAAR Take 100 mg by mouth daily.   metoprolol succinate 50 MG 24 hr tablet Commonly known as: TOPROL-XL Take 0.5 tablets by mouth daily.   omeprazole 20 MG capsule Commonly known as: PRILOSEC Take 20 mg by mouth daily.   Oxycodone HCl 10 MG Tabs Take 1 tablet (10 mg total) by mouth every 6 (six) hours as needed for up to 5 days for moderate pain.   polyethylene glycol 17 g packet Commonly known as: MIRALAX / GLYCOLAX Take 17 g by mouth 2 (two) times daily.   senna-docusate 8.6-50 MG tablet Commonly known as: Senokot-S Take 2 tablets by mouth at bedtime.   timolol 0.5 % ophthalmic gel-forming Commonly known as: TIMOPTIC-XR Apply 1 drop to eye daily.   torsemide 20 MG  tablet Commonly known as: DEMADEX Take by mouth.   traZODone 50 MG tablet Commonly known as: DESYREL Take 50 mg by mouth at bedtime.        Follow-up Information     [facility].   Contact information: [address] [place] [address] [phone]         Malen Gauze, MD Follow up.   Specialty: Urology Why: call if you do not hear from them Contact information: [address] [place] [address] [phone]                No Known Allergies  Consultations: Urology   Procedures/Studies: CT Angio Chest/Abd/Pel for Dissection W and/or Wo Contrast  Result Date: 12/13/2022 CLINICAL DATA:  Chest pain.  Back pain. EXAM: CT ANGIOGRAPHY CHEST, ABDOMEN AND PELVIS TECHNIQUE: Non-contrast CT of the chest was initially obtained. Multidetector CT imaging through the chest, abdomen and pelvis was performed using the standard protocol during bolus administration of intravenous contrast. Multiplanar reconstructed images and MIPs were obtained and reviewed to evaluate the vascular anatomy. RADIATION DOSE REDUCTION: This  exam was performed according to the departmental dose-optimization program which includes automated exposure control, adjustment of the mA and/or kV according to patient size and/or use of iterative reconstruction technique. CONTRAST:  80mL OMNIPAQUE IOHEXOL 350 MG/ML SOLN COMPARISON:  September 15, 2010. FINDINGS: CTA CHEST FINDINGS Cardiovascular: Atherosclerosis of thoracic aorta is noted without aneurysm or dissection. Normal cardiac size. No pericardial effusion. Coronary artery calcifications are noted. Mediastinum/Nodes: Thyroid gland and esophagus are unremarkable. Multiple enlarged lymph nodes are seen along the left side of the superior mediastinum, with the largest measuring 12 mm. Lungs/Pleura: No pneumothorax or pleural effusion is noted. Minimal emphysematous disease is noted in the lower  lobes. Minimal bibasilar scarring is noted as well. Musculoskeletal: Diffuse sclerotic densities are noted throughout the thoracic spine and ribs and sternum consistent with osseous metastases. Review of the MIP images confirms the above findings. CTA ABDOMEN AND PELVIS FINDINGS VASCULAR Aorta: Atherosclerosis of abdominal aorta is noted without aneurysm or dissection. Celiac: Patent without evidence of aneurysm, dissection, vasculitis or significant stenosis. SMA: Patent without evidence of aneurysm, dissection, vasculitis or significant stenosis. Renals: Not well visualized due to probable motion artifact. IMA: Not well visualized due to possible motion artifact. Inflow: Patent without evidence of aneurysm, dissection, vasculitis or significant stenosis. Veins: No obvious venous abnormality within the limitations of this arterial phase study. Review of the MIP images confirms the above findings. NON-VASCULAR Hepatobiliary: No focal liver abnormality is seen. No gallstones, gallbladder wall thickening, or biliary dilatation. Pancreas: Unremarkable. No pancreatic ductal dilatation or surrounding inflammatory changes. Spleen: Normal in size without focal abnormality. Adrenals/Urinary Tract: Adrenal glands appear normal. Mild right hydroureteronephrosis is noted without obstructing calculus. This appears to be due to irregular mass measuring 4.0 x 2.6 cm involving the right and posterior aspect of the urinary bladder near the right ureterovesical junction. This is concerning for malignancy. Stomach/Bowel: Stomach is within normal limits. Appendix appears normal. No evidence of bowel wall thickening, distention, or inflammatory changes. Diffuse colonic diverticulosis is noted without inflammation. Lymphatic: Extensive retroperitoneal adenopathy is noted, with largest lymph node measuring 2 cm in left periaortic region. Right common and external iliac adenopathy is noted as well. Largest lymph node measures 2.1 cm in  right external iliac region. Left common iliac lymph node is noted measuring 15 mm. 13 mm right inguinal lymph node is noted. Reproductive: Mild prostatic enlargement is noted. It appears to extend into inferior portion of urinary bladder. Other: No abdominal wall hernia or abnormality. No abdominopelvic ascites. Musculoskeletal: Multiple sclerotic densities are noted in the lumbar spine, pelvis and visualized femurs consistent with metastatic disease. Review of the MIP images confirms the above findings. IMPRESSION: Mild mediastinal adenopathy is noted as well as extensive periaortic and right iliac adenopathy is noted most consistent with metastatic disease or possibly lymphoma. Right inguinal adenopathy is noted as well. Please see above for measurements. Also noted are diffuse sclerotic densities throughout the visualized skeleton consistent with osseous metastases. Mild right hydroureteronephrosis is noted which appears to be due to occlusion from irregular mass involving the posterior portion of the urinary bladder in the region of the right ureterovesical junction. It is uncertain if this mass represents primary bladder cancer, or potentially may represent extension of prostate cancer. Cystoscopy is recommended for further evaluation. No definite evidence of thoracic or abdominal aortic dissection or aneurysm. Aortic Atherosclerosis (ICD10-I70.0). Electronically Signed   By: Lupita Raider M.D.   On: 12/13/2022 12:13   DG Chest Port 1 View  Result Date: 12/13/2022  CLINICAL DATA:  [id number] Chest pain [id number] EXAM: PORTABLE CHEST 1 VIEW COMPARISON:  11/03/2022 FINDINGS: Left-sided implanted cardiac device, stable. Slight prominence of the cardiac silhouette is likely accentuated by low lung volumes. Aortic atherosclerosis. No focal airspace consolidation, pleural effusion, or pneumothorax. Reverse left shoulder arthroplasty. 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 IMPRESSION: Low lung  volumes. No acute cardiopulmonary findings. Electronically Signed   By: Duanne Guess D.O.   On: 12/13/2022 10:45   (Echo, Carotid, EGD, Colonoscopy, ERCP)    Subjective: Patient seen and examined.  No overnight events.  Does get occasional pain but controlled with current regimen.  He is asking about spine precautions.  He wants to complete the project that he is doing at home.  We discussed about spine precautions until diagnosis and pain is controlled. Discussed with family at bedside 7/29 and discharging with clear plan for follow-up.   Discharge Exam: Vitals:   12/15/22 0013 12/15/22 0522  BP:  (!) 161/79  Pulse: 80 86  Resp: 18 18  Temp:  98.3 F (36.8 C)  SpO2: 95% 96%   Vitals:   12/14/22 1311 12/14/22 2121 12/15/22 0013 12/15/22 0522  BP: (!) 152/76 (!) 162/93  (!) 161/79  Pulse: 85 82 80 86  Resp: 18 18 18 18   Temp: 97.7 F (36.5 C) 98.5 F (36.9 C)  98.3 F (36.8 C)  TempSrc: Oral Oral  Oral  SpO2: 100% 96% 95% 96%  Weight:      Height:        General: Pt is alert, awake, not in acute distress Eating breakfast.  Sitting at edge of the bed.  Looks comfortable. Cardiovascular: RRR, S1/S2 +, no rubs, no gallops Respiratory: CTA bilaterally, no wheezing, no rhonchi Abdominal: Soft, NT, ND, bowel sounds + Extremities: no edema, no cyanosis    The results of significant diagnostics from this hospitalization (including imaging, microbiology, ancillary and laboratory) are listed below for reference.     Microbiology: No results found for this or any previous visit (from the past 240 hour(s)).   Labs: BNP (last 3 results) Recent Labs    10/28/22 2112 11/02/22 1659 11/03/22 0943  BNP 498.0* 264.0* 386.0*   Basic Metabolic Panel: Recent Labs  Lab 12/13/22 0955 12/14/22 0421  NA 131* 135  K 4.1 4.1  CL 98 100  CO2 27 24  GLUCOSE 129* 122*  BUN 46* 42*  CREATININE 1.81* 1.53*  CALCIUM 8.6* 8.6*   Liver Function Tests: Recent Labs  Lab  12/13/22 1324  AST 26  ALT 27  ALKPHOS 469*  BILITOT 0.6  PROT 7.6  ALBUMIN 3.2*   No results for input(s): "LIPASE", "AMYLASE" in the last 168 hours. No results for input(s): "AMMONIA" in the last 168 hours. CBC: Recent Labs  Lab 12/13/22 0955 12/14/22 0421  WBC 6.3 6.4  HGB 9.1* 8.7*  HCT 29.0* 27.9*  MCV 96.0 97.2  PLT 347 357   Cardiac Enzymes: No results for input(s): "CKTOTAL", "CKMB", "CKMBINDEX", "TROPONINI" in the last 168 hours. BNP: Invalid input(s): "POCBNP" CBG: No results for input(s): "GLUCAP" in the last 168 hours. D-Dimer No results for input(s): "DDIMER" in the last 72 hours. Hgb A1c No results for input(s): "HGBA1C" in the last 72 hours. Lipid Profile No results for input(s): "CHOL", "HDL", "LDLCALC", "TRIG", "CHOLHDL", "LDLDIRECT" in the last 72 hours. Thyroid function studies No results for input(s): "TSH", "T4TOTAL", "T3FREE", "THYROIDAB" in the last 72 hours.  Invalid input(s): "FREET3" Anemia work up No results for  input(s): "VITAMINB12", "FOLATE", "FERRITIN", "TIBC", "IRON", "RETICCTPCT" in the last 72 hours. Urinalysis    Component Value Date/Time   COLORURINE YELLOW 09/18/2010 1918   APPEARANCEUR CLOUDY (A) 09/18/2010 1918   LABSPEC 1.020 09/18/2010 1918   PHURINE 5.5 09/18/2010 1918   GLUCOSEU NEGATIVE 09/18/2010 1918   HGBUR TRACE (A) 09/18/2010 1918   BILIRUBINUR NEGATIVE 09/18/2010 1918   KETONESUR NEGATIVE 09/18/2010 1918   PROTEINUR 30 (A) 09/18/2010 1918   UROBILINOGEN 1.0 09/18/2010 1918   NITRITE NEGATIVE 09/18/2010 1918   LEUKOCYTESUR NEGATIVE 09/18/2010 1918   Sepsis Labs Recent Labs  Lab 12/13/22 0955 12/14/22 0421  WBC 6.3 6.4   Microbiology No results found for this or any previous visit (from the past 240 hour(s)).   Time coordinating discharge:  35 minutes  SIGNED:   Dorcas Carrow, MD  [facility] 12/15/2022, 10:07 AM

## 2022-12-18 ENCOUNTER — Emergency Department (HOSPITAL_COMMUNITY)
Admission: EM | Admit: 2022-12-18 | Discharge: 2022-12-18 | Disposition: A | Discharge status: Home / Self Care | Payer: No Typology Code available for payment source | Attending: Emergency Medicine | Admitting: Emergency Medicine

## 2022-12-18 ENCOUNTER — Other Ambulatory Visit: Payer: Self-pay

## 2022-12-18 ENCOUNTER — Emergency Department (HOSPITAL_COMMUNITY): Payer: No Typology Code available for payment source

## 2022-12-18 ENCOUNTER — Encounter (HOSPITAL_COMMUNITY): Payer: Self-pay

## 2022-12-18 DIAGNOSIS — R051 Acute cough: Secondary | ICD-10-CM

## 2022-12-18 DIAGNOSIS — R0602 Shortness of breath: Secondary | ICD-10-CM | POA: Diagnosis present

## 2022-12-18 DIAGNOSIS — I129 Hypertensive chronic kidney disease with stage 1 through stage 4 chronic kidney disease, or unspecified chronic kidney disease: Secondary | ICD-10-CM | POA: Diagnosis not present

## 2022-12-18 DIAGNOSIS — U071 COVID-19: Secondary | ICD-10-CM | POA: Insufficient documentation

## 2022-12-18 DIAGNOSIS — Z79899 Other long term (current) drug therapy: Secondary | ICD-10-CM | POA: Insufficient documentation

## 2022-12-18 DIAGNOSIS — Z87891 Personal history of nicotine dependence: Secondary | ICD-10-CM | POA: Diagnosis not present

## 2022-12-18 DIAGNOSIS — Z8673 Personal history of transient ischemic attack (TIA), and cerebral infarction without residual deficits: Secondary | ICD-10-CM | POA: Diagnosis not present

## 2022-12-18 DIAGNOSIS — N1831 Chronic kidney disease, stage 3a: Secondary | ICD-10-CM | POA: Diagnosis not present

## 2022-12-18 LAB — COMPREHENSIVE METABOLIC PANEL
ALT: 30 U/L (ref 0–44)
AST: 33 U/L (ref 15–41)
Albumin: 3.2 g/dL — ABNORMAL LOW (ref 3.5–5.0)
Alkaline Phosphatase: 374 U/L — ABNORMAL HIGH (ref 38–126)
Anion gap: 9 (ref 5–15)
BUN: 33 mg/dL — ABNORMAL HIGH (ref 8–23)
CO2: 25 mmol/L (ref 22–32)
Calcium: 6.8 mg/dL — ABNORMAL LOW (ref 8.9–10.3)
Chloride: 98 mmol/L (ref 98–111)
Creatinine, Ser: 1.44 mg/dL — ABNORMAL HIGH (ref 0.61–1.24)
GFR, Estimated: 51 mL/min — ABNORMAL LOW (ref >60)
Glucose, Bld: 103 mg/dL — ABNORMAL HIGH (ref 70–99)
Potassium: 4.2 mmol/L (ref 3.5–5.1)
Sodium: 132 mmol/L — ABNORMAL LOW (ref 135–145)
Total Bilirubin: 0.2 mg/dL — ABNORMAL LOW (ref 0.3–1.2)
Total Protein: 7 g/dL (ref 6.5–8.1)

## 2022-12-18 LAB — CBC WITH DIFFERENTIAL/PLATELET
Abs Immature Granulocytes: 0.5 10*3/uL — ABNORMAL HIGH (ref 0.00–0.07)
Band Neutrophils: 1 %
Basophils Absolute: 0 10*3/uL (ref 0.0–0.1)
Basophils Relative: 0 %
Eosinophils Absolute: 0.4 10*3/uL (ref 0.0–0.5)
Eosinophils Relative: 5 %
HCT: 26.4 % — ABNORMAL LOW (ref 39.0–52.0)
Hemoglobin: 8.7 g/dL — ABNORMAL LOW (ref 13.0–17.0)
Lymphocytes Relative: 23 %
Lymphs Abs: 1.8 10*3/uL (ref 0.7–4.0)
MCH: 31.2 pg (ref 26.0–34.0)
MCHC: 33 g/dL (ref 30.0–36.0)
MCV: 94.6 fL (ref 80.0–100.0)
Metamyelocytes Relative: 5 %
Monocytes Absolute: 0.7 10*3/uL (ref 0.1–1.0)
Monocytes Relative: 9 %
Myelocytes: 2 %
Neutro Abs: 4.3 10*3/uL (ref 1.7–7.7)
Neutrophils Relative %: 55 %
Platelets: 366 10*3/uL (ref 150–400)
RBC: 2.79 MIL/uL — ABNORMAL LOW (ref 4.22–5.81)
RDW: 14.1 % (ref 11.5–15.5)
WBC: 7.7 10*3/uL (ref 4.0–10.5)
nRBC: 0.9 % — ABNORMAL HIGH (ref 0.0–0.2)

## 2022-12-18 LAB — RESP PANEL BY RT-PCR (RSV, FLU A&B, COVID)  RVPGX2
Influenza A by PCR: NEGATIVE
Influenza B by PCR: NEGATIVE
Resp Syncytial Virus by PCR: NEGATIVE
SARS Coronavirus 2 by RT PCR: POSITIVE — AB

## 2022-12-18 LAB — CK: Total CK: 177 U/L (ref 49–397)

## 2022-12-18 MED ORDER — GUAIFENESIN-DM 100-10 MG/5ML PO SYRP: 5.0000 mL | ORAL_SOLUTION | ORAL | 0 refills | Status: DC | PRN

## 2022-12-18 MED ORDER — MOLNUPIRAVIR EUA 200MG CAPSULE: 4.0000 | ORAL_CAPSULE | Freq: Two times a day (BID) | ORAL | 0 refills | Status: AC

## 2022-12-18 MED ORDER — ALBUTEROL SULFATE HFA 108 (90 BASE) MCG/ACT IN AERS
1.0000 | INHALATION_SPRAY | Freq: Four times a day (QID) | RESPIRATORY_TRACT | 0 refills | Status: DC | PRN
Start: 2022-12-18 — End: 2022-12-30

## 2022-12-18 NOTE — Discharge Instructions (Signed)
It was a pleasure caring for you today in the emergency department.  Be sure to stay hydrated at home over the next few days. Wear a mask when around others, otherwise please quarantine at home to avoid spreading infection. If you symptoms worsen, you have difficulty breathing, vomiting, worsening or worrisome symptoms please return for recheck  Please return to the emergency department for any worsening or worrisome symptoms.

## 2022-12-18 NOTE — ED Provider Notes (Signed)
[place] EMERGENCY DEPARTMENT AT [facility] Provider Note  CSN: [id number] Arrival date & time: 12/18/22 1451  Chief Complaint(s) Shortness of Breath  HPI Luis Donovan is a 77 y.o. male with past medical history as below, significant for HLD, HTN, CKD3a, chronic anemia who presents to the ED with complaint of cough/dib.  Symptom  onset last night, began to have cough with brown/yellow sputum, mild exertional dyspnea. No cp. No n/v, no change to bowel/bladder function. No fevers but perhaps some chills. Spouse developing similar symptoms. No medications pta for symptoms. No home oxygen, has received covid vaccine previously    Past Medical History Past Medical History:  Diagnosis Date   Anemia    CVA (cerebral vascular accident) (HCC) 07/17/2013   Empyema (HCC)    Erectile dysfunction due to arterial insufficiency    Essential (primary) hypertension    Gastro-esophageal reflux disease with esophagitis    Head injury    Heart disease    Hyperlipidemia, unspecified    Hypertension    Obesity, unspecified    Pneumonia    Post-traumatic stress disorder, unspecified    Sleep apnea, unspecified    Patient Active Problem List   Diagnosis Date Noted   Presumed Metastatic cancer (HCC) 12/13/2022   MI with VFib Arrest in 2005/CAD S/P percutaneous coronary angioplasty 12/13/2022   CKD stage 3a, GFR 45-59 ml/min (HCC) 12/13/2022   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 Authorizing Provider  albuterol (VENTOLIN HFA) 108 (90 Base)  MCG/ACT inhaler Inhale 1-2 puffs into the lungs every 6 (six) hours as needed for wheezing or shortness of breath. 12/18/22  Yes Tanda Rockers A, DO  guaiFENesin-dextromethorphan (ROBITUSSIN DM) 100-10 MG/5ML syrup Take 5 mLs by mouth every 4 (four) hours as needed for cough. 12/18/22  Yes Sloan Leiter, DO  molnupiravir EUA (LAGEVRIO) 200 mg CAPS capsule Take 4 capsules (800 mg total) by mouth 2 (two) times daily for 5 days. 12/18/22 12/23/22 Yes Sloan Leiter, DO  molnupiravir EUA (LAGEVRIO) 200 mg CAPS capsule Take 4 capsules (800 mg total) by mouth 2 (two) times daily for 5 days. 12/18/22 12/23/22 Yes Tanda Rockers A, DO  albuterol (VENTOLIN HFA) 108 (90 Base) MCG/ACT inhaler Inhale 2 puffs into the lungs every 4 (four) hours as needed for wheezing or shortness of breath. 07/16/22   [provider]  ARIPiprazole (ABILIFY) 2 MG tablet Take 2 mg by mouth daily. 12/03/21   [provider]  atorvastatin (LIPITOR) 80 MG tablet Take 40 mg by mouth daily. 06/04/22   [provider]  Cholecalciferol 50 MCG (2000 UT) TABS Take 1 tablet by mouth every morning. 07/16/22   [provider]  clotrimazole (LOTRIMIN) 1 % cream Apply 1 Application topically 2 (two) times daily.    [provider]  cyanocobalamin (VITAMIN B12) 500 MCG tablet Take 1 tablet by mouth daily. 07/16/22   [provider]  diclofenac Sodium (VOLTAREN) 1 % GEL Apply 2 g topically 4 (four) times daily as needed (pain). 07/16/22   [provider]  FLUoxetine (  PROZAC) 20 MG capsule Take 80 mg by mouth daily. 03/03/22   [provider]  hydrALAZINE (APRESOLINE) 50 MG tablet Take 50 mg by mouth 3 (three) times daily. 07/16/22   [provider]  ibuprofen (ADVIL,MOTRIN) 200 MG tablet Take 200 mg by mouth every 6 (six) hours as needed for moderate pain. For pain    [provider]  KETOTIFEN FUMARATE OP Apply 1 drop to eye 2 (two) times daily as needed (allergies). 0.025%     [provider]  latanoprost (XALATAN) 0.005 % ophthalmic solution Place 1 drop into both eyes at bedtime.    [provider]  lidocaine (LIDODERM) 5 % Place 1 patch onto the skin daily. 07/16/22   [provider]  losartan (COZAAR) 100 MG tablet Take 100 mg by mouth daily. 06/04/22   [provider]  metoprolol succinate (TOPROL-XL) 50 MG 24 hr tablet Take 0.5 tablets by mouth daily. 06/04/22   [provider]  omeprazole (PRILOSEC) 20 MG capsule Take 20 mg by mouth daily.    [provider]  Oxycodone HCl 10 MG TABS Take 1 tablet (10 mg total) by mouth every 6 (six) hours as needed for up to 5 days. 12/15/22 12/20/22  Dorcas Carrow, MD  polyethylene glycol (MIRALAX / GLYCOLAX) 17 g packet Take 17 g by mouth 2 (two) times daily. 12/15/22   Dorcas Carrow, MD  senna-docusate (SENOKOT-S) 8.6-50 MG tablet Take 2 tablets by mouth at bedtime. 12/15/22 01/14/23  Dorcas Carrow, MD  timolol (TIMOPTIC-XR) 0.5 % ophthalmic gel-forming Apply 1 drop to eye daily. 08/13/22   [provider]  torsemide (DEMADEX) 20 MG tablet Take by mouth. 11/05/22   [provider]  traZODone (DESYREL) 50 MG tablet Take 50 mg by mouth at bedtime. 02/16/13   [provider]                                                                                                                                    Past Surgical History Past Surgical History:  Procedure Laterality Date   CARDIAC DEFIBRILLATOR PLACEMENT     LUNG SURGERY     operation on skin of neck     ROTATOR CUFF REPAIR Left    TENDON REPAIR     VIDEO ASSISTED THORACOSCOPY     Family History History reviewed. No pertinent family history.  Social History Social History   Tobacco Use   Smoking status: Former   Smokeless tobacco: Never  Advertising account planner   Vaping status: Never Used  Substance Use Topics   Alcohol use: No   Drug use: No   Allergies Patient has no known allergies.  Review of  Systems Review of Systems  Constitutional:  Positive for chills. Negative for fever.  HENT:  Negative for trouble swallowing.   Eyes:  Negative for photophobia and visual disturbance.  Respiratory:  Positive for cough. Negative for shortness of breath.  Cardiovascular:  Negative for chest pain and palpitations.  Gastrointestinal:  Negative for abdominal pain, nausea and vomiting.  Endocrine: Negative for polydipsia and polyuria.  Genitourinary:  Negative for difficulty urinating and hematuria.  Musculoskeletal:  Negative for gait problem and joint swelling.  Skin:  Negative for pallor and rash.  Neurological:  Negative for syncope and headaches.  Psychiatric/Behavioral:  Negative for agitation and confusion.   All other systems reviewed and are negative.   Physical Exam Vital Signs  I have reviewed the triage vital signs BP (!) 150/74   Pulse 72   Temp 99 F (37.2 C) (Oral)   Resp 16   Ht 5\' 7"  (1.702 m)   Wt 102.1 kg   SpO2 99%   BMI 35.24 kg/m  Physical Exam Vitals and nursing note reviewed.  Constitutional:      General: He is not in acute distress.    Appearance: Normal appearance. He is well-developed.     Interventions: He is not intubated. HENT:     Head: Normocephalic and atraumatic.     Right Ear: External ear normal.     Left Ear: External ear normal.     Mouth/Throat:     Mouth: Mucous membranes are moist.  Eyes:     General: No scleral icterus. Cardiovascular:     Rate and Rhythm: Normal rate and regular rhythm.     Pulses: Normal pulses.     Heart sounds: Normal heart sounds.  Pulmonary:     Effort: Pulmonary effort is normal. No respiratory distress. He is not intubated.     Breath sounds: Normal breath sounds. No decreased breath sounds.  Abdominal:     General: Abdomen is flat.     Palpations: Abdomen is soft.     Tenderness: There is no abdominal tenderness.  Musculoskeletal:     Cervical back: No rigidity.     Right lower leg: No edema.      Left lower leg: No edema.  Skin:    General: Skin is warm and dry.     Capillary Refill: Capillary refill takes less than 2 seconds.  Neurological:     Mental Status: He is alert.  Psychiatric:        Mood and Affect: Mood normal.        Behavior: Behavior normal.     ED Results and Treatments Labs (all labs ordered are listed, but only abnormal results are displayed) Labs Reviewed  RESP PANEL BY RT-PCR (RSV, FLU A&B, COVID)  RVPGX2 - Abnormal; Notable for the following components:      Result Value   SARS Coronavirus 2 by RT PCR POSITIVE (*)    All other components within normal limits  CBC WITH DIFFERENTIAL/PLATELET - Abnormal; Notable for the following components:   RBC 2.79 (*)    Hemoglobin 8.7 (*)    HCT 26.4 (*)    nRBC 0.9 (*)    Abs Immature Granulocytes 0.50 (*)    All other components within normal limits  COMPREHENSIVE METABOLIC PANEL - Abnormal; Notable for the following components:   Sodium 132 (*)    Glucose, Bld 103 (*)    BUN 33 (*)    Creatinine, Ser 1.44 (*)    Calcium 6.8 (*)    Albumin 3.2 (*)    Alkaline Phosphatase 374 (*)    Total Bilirubin 0.2 (*)    GFR, Estimated 51 (*)    All other components within normal limits  CK  Radiology DG Chest 2 View  Result Date: 12/18/2022 CLINICAL DATA:  Shortness of breath. EXAM: CHEST - 2 VIEW COMPARISON:  December 13, 2022. FINDINGS: Stable cardiomediastinal silhouette. Left-sided defibrillator is unchanged. Left shoulder arthroplasty is noted. Degenerative changes seen involving the right shoulder. No acute pulmonary disease. Bony thorax is unremarkable. IMPRESSION: No active cardiopulmonary disease. Electronically Signed   By: Lupita Raider M.D.   On: 12/18/2022 15:41    Pertinent labs & imaging results that were available during my care of the patient were reviewed by me and considered in my  medical decision making (see MDM for details).  Medications Ordered in ED Medications - No data to display                                                                                                                                   Procedures Procedures  (including critical care time)  Medical Decision Making / ED Course    Medical Decision Making:    Luis Donovan is a 77 y.o. male with past medical history as below, significant for HLD, HTN, CKD3a, chronic anemia who presents to the ED with complaint of cough/dib. . The complaint involves an extensive differential diagnosis and also carries with it a high risk of complications and morbidity.  Serious etiology was considered. Ddx includes but is not limited to: In my evaluation of this patient's dyspnea my DDx includes, but is not limited to, pneumonia, pulmonary embolism, pneumothorax, pulmonary edema, metabolic acidosis, asthma, COPD, cardiac cause, anemia, anxiety, etc.    Complete initial physical exam performed, notably the patient  was no hypoxia, HDS.    Reviewed and confirmed nursing documentation for past medical history, family history, social history.  Vital signs reviewed.    Clinical Course as of 12/18/22 1906  Fri Dec 18, 2022  1742 Hemoglobin(!): 8.7 Similar to prior [SG]  1742 Creatinine(!): 1.44 Similar to prior  [SG]  1742 Alkaline Phosphatase(!): 374 Similar to prior  [SG]  1742 SARS Coronavirus 2 by RT PCR(!): POSITIVE [SG]  1742 CXR w/o overt PNA or PTX  [SG]    Clinical Course User Index [SG] Tanda Rockers A, DO   Pt here w/ cough, exertional dib, ?chills HDS on arrival, no hypoxia Exam is stable COVID19 +tive Labs stable o/w CXR stable On ambient air Recommend supportive care at home with anti-tussive Will also rx levagro Advised to quarantine  F/u with pcp Strict return precautions emphasized Does not meet criteria for admission at this time  The patient improved significantly and was  discharged in stable condition. Detailed discussions were had with the patient regarding current findings, and need for close f/u with PCP or on call doctor. The patient has been instructed to return immediately if the symptoms worsen in any way for re-evaluation. Patient verbalized understanding and is in agreement with current care plan. All questions answered prior to discharge.  Additional history obtained: -Additional history obtained from family -External records from outside source obtained and reviewed including: Chart review including previous notes, labs, imaging, consultation notes including  Home meds Prior labs Prior admission    Lab Tests: -I ordered, reviewed, and interpreted labs.   The pertinent results include:   Labs Reviewed  RESP PANEL BY RT-PCR (RSV, FLU A&B, COVID)  RVPGX2 - Abnormal; Notable for the following components:      Result Value   SARS Coronavirus 2 by RT PCR POSITIVE (*)    All other components within normal limits  CBC WITH DIFFERENTIAL/PLATELET - Abnormal; Notable for the following components:   RBC 2.79 (*)    Hemoglobin 8.7 (*)    HCT 26.4 (*)    nRBC 0.9 (*)    Abs Immature Granulocytes 0.50 (*)    All other components within normal limits  COMPREHENSIVE METABOLIC PANEL - Abnormal; Notable for the following components:   Sodium 132 (*)    Glucose, Bld 103 (*)    BUN 33 (*)    Creatinine, Ser 1.44 (*)    Calcium 6.8 (*)    Albumin 3.2 (*)    Alkaline Phosphatase 374 (*)    Total Bilirubin 0.2 (*)    GFR, Estimated 51 (*)    All other components within normal limits  CK    Notable for covid 19+  EKG   EKG Interpretation Date/Time:  Friday December 18 2022 17:40:19 EDT Ventricular Rate:  79 PR Interval:  141 QRS Duration:  99 QT Interval:  413 QTC Calculation: 474 R Axis:   73  Text Interpretation: Sinus rhythm Multiple premature complexes, vent & supraven Borderline repolarization abnormality similar to  prior no stemi Confirmed by Tanda Rockers ([id number]) on 12/18/2022 5:43:27 PM         Imaging Studies ordered: I ordered imaging studies including cxr I independently visualized the following imaging with scope of interpretation limited to determining acute life threatening conditions related to emergency care; findings noted above, significant for stable xr I independently visualized and interpreted imaging. I agree with the radiologist interpretation   Medicines ordered and prescription drug management: Meds ordered this encounter  Medications   guaiFENesin-dextromethorphan (ROBITUSSIN DM) 100-10 MG/5ML syrup    Sig: Take 5 mLs by mouth every 4 (four) hours as needed for cough.    Dispense:  118 mL    Refill:  0   molnupiravir EUA (LAGEVRIO) 200 mg CAPS capsule    Sig: Take 4 capsules (800 mg total) by mouth 2 (two) times daily for 5 days.    Dispense:  40 capsule    Refill:  0   albuterol (VENTOLIN HFA) 108 (90 Base) MCG/ACT inhaler    Sig: Inhale 1-2 puffs into the lungs every 6 (six) hours as needed for wheezing or shortness of breath.    Dispense:  1 each    Refill:  0   molnupiravir EUA (LAGEVRIO) 200 mg CAPS capsule    Sig: Take 4 capsules (800 mg total) by mouth 2 (two) times daily for 5 days.    Dispense:  40 capsule    Refill:  0    -I have reviewed the patients home medicines and have made adjustments as needed   Consultations Obtained: na   Cardiac Monitoring: The patient was maintained on a cardiac monitor.  I personally viewed and interpreted the cardiac monitored which showed an underlying rhythm of: NSR  Social Determinants of Health:  Diagnosis or treatment significantly limited  by social determinants of health: former smoker and obesity   Reevaluation: After the interventions noted above, I reevaluated the patient and found that they have improved  Co morbidities that complicate the patient evaluation  Past Medical History:  Diagnosis Date   Anemia     CVA (cerebral vascular accident) (HCC) 07/17/2013   Empyema (HCC)    Erectile dysfunction due to arterial insufficiency    Essential (primary) hypertension    Gastro-esophageal reflux disease with esophagitis    Head injury    Heart disease    Hyperlipidemia, unspecified    Hypertension    Obesity, unspecified    Pneumonia    Post-traumatic stress disorder, unspecified    Sleep apnea, unspecified       Dispostion: Disposition decision including need for hospitalization was considered, and patient discharged from emergency department.    Final Clinical Impression(s) / ED Diagnoses Final diagnoses:  COVID-19  Acute cough        Sloan Leiter, DO 12/18/22 1906

## 2022-12-18 NOTE — ED Triage Notes (Signed)
SOB on exertion  Cough increased since last night  Bringing up brown sputum

## 2022-12-22 ENCOUNTER — Telehealth: Payer: Self-pay

## 2022-12-22 NOTE — Telephone Encounter (Signed)
Pt call returned and appointment confirmed with pt daughter and wife.

## 2022-12-28 NOTE — Progress Notes (Unsigned)
Palliative Medicine [facility]  [phone] [phone]   Name: Luis Donovan Date: 12/28/2022 MRN: [id number]  DOB: July 11, 1945  Patient Care Team: [facility] as PCP - General    REASON FOR CONSULTATION: [name] is a 77 y.o. 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  Palliative ask to see for symptom management and goals of care.    SOCIAL HISTORY:     reports that he has quit smoking. He has never used smokeless tobacco. He reports that he does not drink alcohol and does not use drugs.  ADVANCE DIRECTIVES:  None on file  CODE STATUS: Full code  PAST MEDICAL HISTORY: Past Medical History:  Diagnosis Date   Anemia    CVA (cerebral vascular accident) (HCC) 07/17/2013   Empyema (HCC)    Erectile dysfunction due to arterial insufficiency    Essential (primary) hypertension    Gastro-esophageal reflux disease with esophagitis    Head injury    Heart disease    Hyperlipidemia, unspecified    Hypertension    Obesity, unspecified    Pneumonia    Post-traumatic stress disorder, unspecified    Sleep apnea, unspecified     PAST SURGICAL HISTORY:  Past Surgical History:  Procedure Laterality Date   CARDIAC DEFIBRILLATOR PLACEMENT     LUNG SURGERY     operation on skin of neck     ROTATOR CUFF REPAIR Left    TENDON REPAIR     VIDEO ASSISTED THORACOSCOPY      HEMATOLOGY/ONCOLOGY HISTORY:  Oncology History   No history exists.    ALLERGIES:  has No Known Allergies.  MEDICATIONS:  Current Outpatient Medications  Medication Sig Dispense Refill   albuterol (VENTOLIN HFA) 108 (90 Base) MCG/ACT inhaler Inhale 2  puffs into the lungs every 4 (four) hours as needed for wheezing or shortness of breath.     albuterol (VENTOLIN HFA) 108 (90 Base) MCG/ACT inhaler Inhale 1-2 puffs into the lungs every 6 (six) hours as needed for wheezing or shortness of breath. 1 each 0   ARIPiprazole (ABILIFY) 2 MG tablet Take 2 mg by mouth daily.     atorvastatin (LIPITOR) 80 MG tablet Take 40 mg by mouth daily.     Cholecalciferol 50 MCG (2000 UT) TABS Take 1 tablet by mouth every morning.     clotrimazole (LOTRIMIN) 1 % cream Apply 1 Application topically 2 (two) times daily.     cyanocobalamin (VITAMIN B12) 500 MCG tablet Take 1 tablet by mouth daily.     diclofenac Sodium (VOLTAREN) 1 % GEL Apply 2 g topically 4 (four) times daily as needed (pain).     FLUoxetine (PROZAC) 20 MG capsule Take 80 mg by mouth daily.     guaiFENesin-dextromethorphan (ROBITUSSIN DM) 100-10 MG/5ML syrup Take 5 mLs by mouth every 4 (four) hours as needed for cough. 118 mL 0   hydrALAZINE (APRESOLINE) 50 MG tablet Take 50 mg by mouth 3 (three) times daily.     ibuprofen (ADVIL,MOTRIN) 200 MG tablet Take 200 mg by mouth every 6 (six) hours as needed for moderate pain. For pain     KETOTIFEN FUMARATE OP Apply 1 drop to eye 2 (two) times daily as needed (allergies).  0.025%     latanoprost (XALATAN) 0.005 % ophthalmic solution Place 1 drop into both eyes at bedtime.     lidocaine (LIDODERM) 5 % Place 1 patch onto the skin daily.     losartan (COZAAR) 100 MG tablet Take 100 mg by mouth daily.     metoprolol succinate (TOPROL-XL) 50 MG 24 hr tablet Take 0.5 tablets by mouth daily.     omeprazole (PRILOSEC) 20 MG capsule Take 20 mg by mouth daily.     polyethylene glycol (MIRALAX / GLYCOLAX) 17 g packet Take 17 g by mouth 2 (two) times daily. 14 each 0   senna-docusate (SENOKOT-S) 8.6-50 MG tablet Take 2 tablets by mouth at bedtime. 60 tablet 0   timolol (TIMOPTIC-XR) 0.5 % ophthalmic gel-forming Apply 1 drop to eye daily.     torsemide (DEMADEX) 20 MG  tablet Take by mouth.     traZODone (DESYREL) 50 MG tablet Take 50 mg by mouth at bedtime.     No current facility-administered medications for this visit.    VITAL SIGNS: There were no vitals taken for this visit. There were no vitals filed for this visit.  Estimated body mass index is 35.24 kg/m as calculated from the following:   Height as of 12/18/22: 5\' 7"  (1.702 m).   Weight as of 12/18/22: 225 lb (102.1 kg).  LABS: CBC:    Component Value Date/Time   WBC 7.7 12/18/2022 1536   HGB 8.7 (L) 12/18/2022 1536   HCT 26.4 (L) 12/18/2022 1536   PLT 366 12/18/2022 1536   MCV 94.6 12/18/2022 1536   NEUTROABS 4.3 12/18/2022 1536   LYMPHSABS 1.8 12/18/2022 1536   MONOABS 0.7 12/18/2022 1536   EOSABS 0.4 12/18/2022 1536   BASOSABS 0.0 12/18/2022 1536   Comprehensive Metabolic Panel:    Component Value Date/Time   NA 132 (L) 12/18/2022 1536   K 4.2 12/18/2022 1536   CL 98 12/18/2022 1536   CO2 25 12/18/2022 1536   BUN 33 (H) 12/18/2022 1536   CREATININE 1.44 (H) 12/18/2022 1536   GLUCOSE 103 (H) 12/18/2022 1536   CALCIUM 6.8 (L) 12/18/2022 1536   AST 33 12/18/2022 1536   ALT 30 12/18/2022 1536   ALKPHOS 374 (H) 12/18/2022 1536   BILITOT 0.2 (L) 12/18/2022 1536   PROT 7.0 12/18/2022 1536   ALBUMIN 3.2 (L) 12/18/2022 1536    RADIOGRAPHIC STUDIES:  CT Angio Chest/Abd/Pel for Dissection W and/or Wo Contrast  Result Date: 12/13/2022 CLINICAL DATA:  Chest pain.  Back pain. EXAM: CT ANGIOGRAPHY CHEST, ABDOMEN AND PELVIS TECHNIQUE: Non-contrast CT of the chest was initially obtained. Multidetector CT imaging through the chest, abdomen and pelvis was performed using the standard protocol during bolus administration of intravenous contrast. Multiplanar reconstructed images and MIPs were obtained and reviewed to evaluate the vascular anatomy. RADIATION DOSE REDUCTION: This exam was performed according to the departmental dose-optimization program which includes automated exposure  control, adjustment of the mA and/or kV according to patient size and/or use of iterative reconstruction technique. CONTRAST:  80mL OMNIPAQUE IOHEXOL 350 MG/ML SOLN COMPARISON:  September 15, 2010. FINDINGS: CTA CHEST FINDINGS Cardiovascular: Atherosclerosis of thoracic aorta is noted without aneurysm or dissection. Normal cardiac size. No pericardial effusion. Coronary artery calcifications are noted. Mediastinum/Nodes: Thyroid gland and esophagus are unremarkable. Multiple enlarged lymph nodes are seen along the left side of the superior mediastinum, with the largest measuring 12 mm. Lungs/Pleura: No pneumothorax or pleural effusion is noted. Minimal emphysematous disease is noted in the lower  lobes. Minimal bibasilar scarring is noted as well. Musculoskeletal: Diffuse sclerotic densities are noted throughout the thoracic spine and ribs and sternum consistent with osseous metastases. Review of the MIP images confirms the above findings. CTA ABDOMEN AND PELVIS FINDINGS VASCULAR Aorta: Atherosclerosis of abdominal aorta is noted without aneurysm or dissection. Celiac: Patent without evidence of aneurysm, dissection, vasculitis or significant stenosis. SMA: Patent without evidence of aneurysm, dissection, vasculitis or significant stenosis. Renals: Not well visualized due to probable motion artifact. IMA: Not well visualized due to possible motion artifact. Inflow: Patent without evidence of aneurysm, dissection, vasculitis or significant stenosis. Veins: No obvious venous abnormality within the limitations of this arterial phase study. Review of the MIP images confirms the above findings. NON-VASCULAR Hepatobiliary: No focal liver abnormality is seen. No gallstones, gallbladder wall thickening, or biliary dilatation. Pancreas: Unremarkable. No pancreatic ductal dilatation or surrounding inflammatory changes. Spleen: Normal in size without focal abnormality. Adrenals/Urinary Tract: Adrenal glands appear normal. Mild  right hydroureteronephrosis is noted without obstructing calculus. This appears to be due to irregular mass measuring 4.0 x 2.6 cm involving the right and posterior aspect of the urinary bladder near the right ureterovesical junction. This is concerning for malignancy. Stomach/Bowel: Stomach is within normal limits. Appendix appears normal. No evidence of bowel wall thickening, distention, or inflammatory changes. Diffuse colonic diverticulosis is noted without inflammation. Lymphatic: Extensive retroperitoneal adenopathy is noted, with largest lymph node measuring 2 cm in left periaortic region. Right common and external iliac adenopathy is noted as well. Largest lymph node measures 2.1 cm in right external iliac region. Left common iliac lymph node is noted measuring 15 mm. 13 mm right inguinal lymph node is noted. Reproductive: Mild prostatic enlargement is noted. It appears to extend into inferior portion of urinary bladder. Other: No abdominal wall hernia or abnormality. No abdominopelvic ascites. Musculoskeletal: Multiple sclerotic densities are noted in the lumbar spine, pelvis and visualized femurs consistent with metastatic disease. Review of the MIP images confirms the above findings. IMPRESSION: Mild mediastinal adenopathy is noted as well as extensive periaortic and right iliac adenopathy is noted most consistent with metastatic disease or possibly lymphoma. Right inguinal adenopathy is noted as well. Please see above for measurements. Also noted are diffuse sclerotic densities throughout the visualized skeleton consistent with osseous metastases. Mild right hydroureteronephrosis is noted which appears to be due to occlusion from irregular mass involving the posterior portion of the urinary bladder in the region of the right ureterovesical junction. It is uncertain if this mass represents primary bladder cancer, or potentially may represent extension of prostate cancer. Cystoscopy is recommended for  further evaluation. No definite evidence of thoracic or abdominal aortic dissection or aneurysm. Aortic Atherosclerosis (ICD10-I70.0). Electronically Signed   By: Lupita Raider M.D.   On: 12/13/2022 12:13    PERFORMANCE STATUS (ECOG) : {CHL ONC ECOG ON:[id number]}  Review of Systems Unless otherwise noted, a complete review of systems is negative.  Physical Exam General: NAD Cardiovascular: regular rate and rhythm Pulmonary: clear ant fields Abdomen: soft, nontender, + bowel sounds Extremities: no edema, no joint deformities Skin: no rashes Neurological:  IMPRESSION: *** I introduced myself, Maygan RN, and Palliative's role in collaboration with the oncology team. Concept of Palliative Care was introduced as specialized medical care for people and their families living with serious illness.  It focuses on providing relief from the symptoms and stress of a serious illness.  The goal is to improve quality of life for both the patient and the family. Values  and goals of care important to patient and family were attempted to be elicited.    We discussed *** current illness and what it means in the larger context of *** on-going co-morbidities. Natural disease trajectory and expectations were discussed.  I discussed the importance of continued conversation with family and their medical providers regarding overall plan of care and treatment options, ensuring decisions are within the context of the patients values and GOCs.  PLAN: Established therapeutic relationship. Education provided on palliative's role in collaboration with their Oncology/Radiation team. I will plan to see patient back in 2-4 weeks in collaboration to other oncology appointments.    Patient expressed understanding and was in agreement with this plan. He also understands that He can call the clinic at any time with any questions, concerns, or complaints.   Thank you for your referral and allowing Palliative to assist  in Mr. Kendyn Paler Dambrosia's care.   Number and complexity of problems addressed: ***HIGH - 1 or more chronic illnesses with SEVERE exacerbation, progression, or side effects of treatment - advanced cancer, pain. Any controlled substances utilized were prescribed in the context of palliative care.   Visit consisted of counseling and education dealing with the complex and emotionally intense issues of symptom management and palliative care in the setting of serious and potentially life-threatening illness.Greater than 50%  of this time was spent counseling and coordinating care related to the above assessment and plan.  Signed by: Willette Alma, AGPCNP-BC Palliative Medicine Team/[facility]   *Please note that this is a verbal dictation therefore any spelling or grammatical errors are due to the "Dragon [employer] One" system interpretation.

## 2022-12-30 ENCOUNTER — Other Ambulatory Visit: Payer: Self-pay

## 2022-12-30 ENCOUNTER — Inpatient Hospital Stay (HOSPITAL_COMMUNITY)
Admission: RE | Admit: 2022-12-30 | Discharge: 2023-01-01 | DRG: 683 | Disposition: A | Discharge status: Home Health | Payer: No Typology Code available for payment source | Source: Ambulatory Visit | Attending: Internal Medicine | Admitting: Internal Medicine

## 2022-12-30 ENCOUNTER — Encounter (HOSPITAL_COMMUNITY): Payer: Self-pay

## 2022-12-30 ENCOUNTER — Encounter (HOSPITAL_COMMUNITY): Payer: Self-pay | Admitting: Family Medicine

## 2022-12-30 ENCOUNTER — Inpatient Hospital Stay: Payer: Medicare PPO | Attending: Nurse Practitioner | Admitting: Nurse Practitioner

## 2022-12-30 ENCOUNTER — Encounter: Payer: Self-pay | Admitting: Nurse Practitioner

## 2022-12-30 ENCOUNTER — Inpatient Hospital Stay: Payer: Medicare PPO

## 2022-12-30 VITALS — BP 143/68 | HR 82 | Temp 98.5°F | Resp 16 | Wt 225.1 lb

## 2022-12-30 DIAGNOSIS — Z87891 Personal history of nicotine dependence: Secondary | ICD-10-CM | POA: Diagnosis not present

## 2022-12-30 DIAGNOSIS — U099 Post covid-19 condition, unspecified: Secondary | ICD-10-CM | POA: Diagnosis present

## 2022-12-30 DIAGNOSIS — R630 Anorexia: Secondary | ICD-10-CM

## 2022-12-30 DIAGNOSIS — H409 Unspecified glaucoma: Secondary | ICD-10-CM | POA: Diagnosis present

## 2022-12-30 DIAGNOSIS — C799 Secondary malignant neoplasm of unspecified site: Secondary | ICD-10-CM | POA: Diagnosis not present

## 2022-12-30 DIAGNOSIS — Z515 Encounter for palliative care: Secondary | ICD-10-CM

## 2022-12-30 DIAGNOSIS — N329 Bladder disorder, unspecified: Secondary | ICD-10-CM | POA: Diagnosis not present

## 2022-12-30 DIAGNOSIS — Z95810 Presence of automatic (implantable) cardiac defibrillator: Secondary | ICD-10-CM

## 2022-12-30 DIAGNOSIS — C7951 Secondary malignant neoplasm of bone: Secondary | ICD-10-CM | POA: Diagnosis present

## 2022-12-30 DIAGNOSIS — G893 Neoplasm related pain (acute) (chronic): Secondary | ICD-10-CM | POA: Insufficient documentation

## 2022-12-30 DIAGNOSIS — K5900 Constipation, unspecified: Secondary | ICD-10-CM | POA: Diagnosis present

## 2022-12-30 DIAGNOSIS — Z6835 Body mass index (BMI) 35.0-35.9, adult: Secondary | ICD-10-CM

## 2022-12-30 DIAGNOSIS — N179 Acute kidney failure, unspecified: Secondary | ICD-10-CM | POA: Diagnosis not present

## 2022-12-30 DIAGNOSIS — Z7189 Other specified counseling: Secondary | ICD-10-CM

## 2022-12-30 DIAGNOSIS — I252 Old myocardial infarction: Secondary | ICD-10-CM

## 2022-12-30 DIAGNOSIS — D630 Anemia in neoplastic disease: Secondary | ICD-10-CM | POA: Diagnosis present

## 2022-12-30 DIAGNOSIS — Z79899 Other long term (current) drug therapy: Secondary | ICD-10-CM | POA: Insufficient documentation

## 2022-12-30 DIAGNOSIS — R531 Weakness: Secondary | ICD-10-CM

## 2022-12-30 DIAGNOSIS — Z8673 Personal history of transient ischemic attack (TIA), and cerebral infarction without residual deficits: Secondary | ICD-10-CM

## 2022-12-30 DIAGNOSIS — C801 Malignant (primary) neoplasm, unspecified: Secondary | ICD-10-CM | POA: Insufficient documentation

## 2022-12-30 DIAGNOSIS — R110 Nausea: Secondary | ICD-10-CM | POA: Diagnosis not present

## 2022-12-30 DIAGNOSIS — R530 Neoplastic (malignant) related fatigue: Secondary | ICD-10-CM

## 2022-12-30 DIAGNOSIS — Z7901 Long term (current) use of anticoagulants: Secondary | ICD-10-CM

## 2022-12-30 DIAGNOSIS — G4733 Obstructive sleep apnea (adult) (pediatric): Secondary | ICD-10-CM | POA: Diagnosis present

## 2022-12-30 DIAGNOSIS — E785 Hyperlipidemia, unspecified: Secondary | ICD-10-CM | POA: Diagnosis present

## 2022-12-30 DIAGNOSIS — K2100 Gastro-esophageal reflux disease with esophagitis, without bleeding: Secondary | ICD-10-CM | POA: Diagnosis present

## 2022-12-30 DIAGNOSIS — E669 Obesity, unspecified: Secondary | ICD-10-CM | POA: Diagnosis present

## 2022-12-30 DIAGNOSIS — F4310 Post-traumatic stress disorder, unspecified: Secondary | ICD-10-CM | POA: Diagnosis present

## 2022-12-30 DIAGNOSIS — I10 Essential (primary) hypertension: Secondary | ICD-10-CM | POA: Diagnosis present

## 2022-12-30 LAB — CBC WITH DIFFERENTIAL (CANCER CENTER ONLY)
Abs Immature Granulocytes: 0.24 10*3/uL — ABNORMAL HIGH (ref 0.00–0.07)
Basophils Absolute: 0 10*3/uL (ref 0.0–0.1)
Basophils Relative: 0 %
Eosinophils Absolute: 0.3 10*3/uL (ref 0.0–0.5)
Eosinophils Relative: 3 %
HCT: 27.5 % — ABNORMAL LOW (ref 39.0–52.0)
Hemoglobin: 8.7 g/dL — ABNORMAL LOW (ref 13.0–17.0)
Immature Granulocytes: 3 %
Lymphocytes Relative: 26 %
Lymphs Abs: 2.5 10*3/uL (ref 0.7–4.0)
MCH: 30.2 pg (ref 26.0–34.0)
MCHC: 31.6 g/dL (ref 30.0–36.0)
MCV: 95.5 fL (ref 80.0–100.0)
Monocytes Absolute: 0.9 10*3/uL (ref 0.1–1.0)
Monocytes Relative: 10 %
Neutro Abs: 5.6 10*3/uL (ref 1.7–7.7)
Neutrophils Relative %: 58 %
Platelet Count: 299 10*3/uL (ref 150–400)
RBC: 2.88 MIL/uL — ABNORMAL LOW (ref 4.22–5.81)
RDW: 15.3 % (ref 11.5–15.5)
WBC Count: 9.6 10*3/uL (ref 4.0–10.5)
nRBC: 0.4 % — ABNORMAL HIGH (ref 0.0–0.2)

## 2022-12-30 LAB — CMP (CANCER CENTER ONLY)
ALT: 14 U/L (ref 0–44)
AST: 23 U/L (ref 15–41)
Albumin: 3.7 g/dL (ref 3.5–5.0)
Alkaline Phosphatase: 438 U/L — ABNORMAL HIGH (ref 38–126)
Anion gap: 6 (ref 5–15)
BUN: 45 mg/dL — ABNORMAL HIGH (ref 8–23)
CO2: 30 mmol/L (ref 22–32)
Calcium: 8.4 mg/dL — ABNORMAL LOW (ref 8.9–10.3)
Chloride: 103 mmol/L (ref 98–111)
Creatinine: 2.01 mg/dL — ABNORMAL HIGH (ref 0.61–1.24)
GFR, Estimated: 34 mL/min — ABNORMAL LOW (ref >60)
Glucose, Bld: 119 mg/dL — ABNORMAL HIGH (ref 70–99)
Potassium: 5.4 mmol/L — ABNORMAL HIGH (ref 3.5–5.1)
Sodium: 139 mmol/L (ref 135–145)
Total Bilirubin: 0.5 mg/dL (ref 0.3–1.2)
Total Protein: 7.2 g/dL (ref 6.5–8.1)

## 2022-12-30 LAB — CBC
HCT: 25.2 % — ABNORMAL LOW (ref 39.0–52.0)
Hemoglobin: 7.9 g/dL — ABNORMAL LOW (ref 13.0–17.0)
MCH: 29.9 pg (ref 26.0–34.0)
MCHC: 31.3 g/dL (ref 30.0–36.0)
MCV: 95.5 fL (ref 80.0–100.0)
Platelets: 324 10*3/uL (ref 150–400)
RBC: 2.64 MIL/uL — ABNORMAL LOW (ref 4.22–5.81)
RDW: 15.5 % (ref 11.5–15.5)
WBC: 8 10*3/uL (ref 4.0–10.5)
nRBC: 0.9 % — ABNORMAL HIGH (ref 0.0–0.2)

## 2022-12-30 LAB — URINALYSIS, ROUTINE W REFLEX MICROSCOPIC
Bacteria, UA: NONE SEEN
Bilirubin Urine: NEGATIVE
Glucose, UA: NEGATIVE mg/dL
Hgb urine dipstick: NEGATIVE
Ketones, ur: NEGATIVE mg/dL
Leukocytes,Ua: NEGATIVE
Nitrite: NEGATIVE
Protein, ur: NEGATIVE mg/dL
Specific Gravity, Urine: 1.015 (ref 1.005–1.030)
pH: 5 (ref 5.0–8.0)

## 2022-12-30 LAB — VITAMIN B12: Vitamin B-12: 2233 pg/mL — ABNORMAL HIGH (ref 180–914)

## 2022-12-30 LAB — CREATININE, SERUM
Creatinine, Ser: 2.04 mg/dL — ABNORMAL HIGH (ref 0.61–1.24)
GFR, Estimated: 34 mL/min — ABNORMAL LOW (ref >60)

## 2022-12-30 LAB — IRON AND TIBC
Iron: 52 ug/dL (ref 45–182)
Saturation Ratios: 22 % (ref 17.9–39.5)
TIBC: 238 ug/dL — ABNORMAL LOW (ref 250–450)
UIBC: 186 ug/dL

## 2022-12-30 LAB — FERRITIN: Ferritin: 509 ng/mL — ABNORMAL HIGH (ref 24–336)

## 2022-12-30 LAB — FOLATE: Folate: 16 ng/mL (ref >5.9)

## 2022-12-30 MED ORDER — METOPROLOL SUCCINATE ER 25 MG PO TB24
25.0000 mg | ORAL_TABLET | Freq: Every day | ORAL | Status: DC
  Administered 2022-12-31 – 2023-01-01 (×2): 25 mg via ORAL
  Filled 2022-12-30 (×2): qty 1

## 2022-12-30 MED ORDER — VITAMIN D3 25 MCG (1000 UNIT) PO TABS
2000.0000 [IU] | ORAL_TABLET | Freq: Every morning | ORAL | Status: DC
  Administered 2022-12-31 – 2023-01-01 (×2): 2000 [IU] via ORAL
  Filled 2022-12-30 (×2): qty 2

## 2022-12-30 MED ORDER — TORSEMIDE 20 MG PO TABS
20.0000 mg | ORAL_TABLET | Freq: Every day | ORAL | Status: DC
  Administered 2022-12-31: 20 mg via ORAL
  Filled 2022-12-30: qty 1

## 2022-12-30 MED ORDER — OXYCODONE HCL 10 MG PO TABS
10.0000 mg | ORAL_TABLET | Freq: Four times a day (QID) | ORAL | 0 refills | Status: DC | PRN
Start: 2022-12-30 — End: 2023-01-01

## 2022-12-30 MED ORDER — SODIUM CHLORIDE 0.9 % IV SOLN: INTRAVENOUS | Status: DC

## 2022-12-30 MED ORDER — HYDROCODONE-ACETAMINOPHEN 5-325 MG PO TABS
1.0000 | ORAL_TABLET | ORAL | Status: DC | PRN
  Administered 2022-12-30 – 2022-12-31 (×2): 2 via ORAL
  Filled 2022-12-30 (×2): qty 2

## 2022-12-30 MED ORDER — LOSARTAN POTASSIUM 50 MG PO TABS
100.0000 mg | ORAL_TABLET | Freq: Every day | ORAL | Status: DC
  Administered 2022-12-31 – 2023-01-01 (×2): 100 mg via ORAL
  Filled 2022-12-30 (×2): qty 2

## 2022-12-30 MED ORDER — LATANOPROST 0.005 % OP SOLN
1.0000 [drp] | Freq: Every day | OPHTHALMIC | Status: DC
  Administered 2022-12-30 – 2022-12-31 (×2): 1 [drp] via OPHTHALMIC
  Filled 2022-12-30: qty 2.5

## 2022-12-30 MED ORDER — SODIUM CHLORIDE 0.9% FLUSH: 3.0000 mL | Freq: Two times a day (BID) | INTRAVENOUS | Status: DC

## 2022-12-30 MED ORDER — TRAZODONE HCL 50 MG PO TABS
50.0000 mg | ORAL_TABLET | Freq: Every day | ORAL | Status: DC
  Administered 2022-12-30 – 2022-12-31 (×2): 50 mg via ORAL
  Filled 2022-12-30 (×2): qty 1

## 2022-12-30 MED ORDER — VITAMIN B-12 1000 MCG PO TABS
500.0000 ug | ORAL_TABLET | Freq: Every day | ORAL | Status: DC
  Administered 2022-12-31 – 2023-01-01 (×2): 500 ug via ORAL
  Filled 2022-12-30 (×2): qty 1

## 2022-12-30 MED ORDER — ATORVASTATIN CALCIUM 40 MG PO TABS
40.0000 mg | ORAL_TABLET | Freq: Every day | ORAL | Status: DC
  Administered 2022-12-30 – 2023-01-01 (×3): 40 mg via ORAL
  Filled 2022-12-30 (×3): qty 1

## 2022-12-30 MED ORDER — POLYETHYLENE GLYCOL 3350 17 G PO PACK
17.0000 g | PACK | Freq: Two times a day (BID) | ORAL | Status: DC
  Administered 2022-12-30 – 2023-01-01 (×4): 17 g via ORAL
  Filled 2022-12-30 (×4): qty 1

## 2022-12-30 MED ORDER — SENNOSIDES-DOCUSATE SODIUM 8.6-50 MG PO TABS
2.0000 | ORAL_TABLET | Freq: Every day | ORAL | Status: DC
  Administered 2022-12-30: 2 via ORAL
  Filled 2022-12-30: qty 2

## 2022-12-30 MED ORDER — MORPHINE SULFATE (PF) 2 MG/ML IV SOLN: 1.0000 mg | Freq: Four times a day (QID) | INTRAVENOUS | Status: DC | PRN

## 2022-12-30 MED ORDER — ENOXAPARIN SODIUM 60 MG/0.6ML IJ SOSY
50.0000 mg | PREFILLED_SYRINGE | INTRAMUSCULAR | Status: DC
  Administered 2022-12-30: 50 mg via SUBCUTANEOUS
  Filled 2022-12-30: qty 0.6

## 2022-12-30 MED ORDER — HYDRALAZINE HCL 50 MG PO TABS
50.0000 mg | ORAL_TABLET | Freq: Three times a day (TID) | ORAL | Status: DC
  Administered 2022-12-30 – 2023-01-01 (×5): 50 mg via ORAL
  Filled 2022-12-30 (×5): qty 1

## 2022-12-30 MED ORDER — PANTOPRAZOLE SODIUM 40 MG PO TBEC
40.0000 mg | DELAYED_RELEASE_TABLET | Freq: Every day | ORAL | Status: DC
  Administered 2022-12-31 – 2023-01-01 (×2): 40 mg via ORAL
  Filled 2022-12-30 (×2): qty 1

## 2022-12-30 MED ORDER — ACETAMINOPHEN 650 MG RE SUPP: 650.0000 mg | Freq: Four times a day (QID) | RECTAL | Status: DC | PRN

## 2022-12-30 MED ORDER — HYDRALAZINE HCL 20 MG/ML IJ SOLN: 10.0000 mg | Freq: Three times a day (TID) | INTRAMUSCULAR | Status: DC | PRN

## 2022-12-30 MED ORDER — ONDANSETRON HCL 4 MG/2ML IJ SOLN
4.0000 mg | Freq: Four times a day (QID) | INTRAMUSCULAR | Status: DC | PRN
  Administered 2022-12-30: 4 mg via INTRAVENOUS
  Filled 2022-12-30: qty 2

## 2022-12-30 MED ORDER — ONDANSETRON HCL 8 MG PO TABS
8.0000 mg | ORAL_TABLET | Freq: Three times a day (TID) | ORAL | 2 refills | Status: DC | PRN
Start: 2022-12-30 — End: 2023-01-14

## 2022-12-30 MED ORDER — ARIPIPRAZOLE 2 MG PO TABS
2.0000 mg | ORAL_TABLET | Freq: Every day | ORAL | Status: DC
  Administered 2022-12-31 – 2023-01-01 (×2): 2 mg via ORAL
  Filled 2022-12-30 (×2): qty 1

## 2022-12-30 MED ORDER — FLUOXETINE HCL 20 MG PO CAPS
80.0000 mg | ORAL_CAPSULE | Freq: Every day | ORAL | Status: DC
  Administered 2022-12-31 – 2023-01-01 (×2): 80 mg via ORAL
  Filled 2022-12-30 (×2): qty 4

## 2022-12-30 MED ORDER — ALBUTEROL SULFATE HFA 108 (90 BASE) MCG/ACT IN AERS: 1.0000 | INHALATION_SPRAY | Freq: Four times a day (QID) | RESPIRATORY_TRACT | Status: DC | PRN

## 2022-12-30 MED ORDER — ALBUTEROL SULFATE HFA 108 (90 BASE) MCG/ACT IN AERS: 2.0000 | INHALATION_SPRAY | RESPIRATORY_TRACT | Status: DC | PRN

## 2022-12-30 MED ORDER — ACETAMINOPHEN 325 MG PO TABS: 650.0000 mg | ORAL_TABLET | Freq: Four times a day (QID) | ORAL | Status: DC | PRN

## 2022-12-30 MED ORDER — ONDANSETRON HCL 4 MG PO TABS
4.0000 mg | ORAL_TABLET | Freq: Four times a day (QID) | ORAL | Status: DC | PRN
  Administered 2023-01-01: 4 mg via ORAL
  Filled 2022-12-30: qty 1

## 2022-12-30 MED ORDER — GUAIFENESIN-DM 100-10 MG/5ML PO SYRP: 5.0000 mL | ORAL_SOLUTION | ORAL | Status: DC | PRN

## 2022-12-30 NOTE — Patient Instructions (Addendum)
-   for your constipation start taking mirilax (powder) every morning to help with regular bowel movements - drink 4 cups of water a day to help with the dizzy feeling - pick up and take your ondansetron (zofran) to take every 8 hours as needed for nausea -  we will refill your oxycodone and continue to take every 6 hours as needed for your pain  - we will have a phone follow up in 2 weeks - if your pain worsens or if you have any questions or concerns call the office at [phone]

## 2022-12-30 NOTE — Plan of Care (Signed)

## 2022-12-30 NOTE — Progress Notes (Signed)
1655 patient arrived by wife bringing him to room alert x4 on room air able to make all needs known uses cane  1745 attempted IV twice unsuccessful consult to IV team

## 2022-12-30 NOTE — H&P (Signed)
History and Physical   [facility] [place] [facility] Admission History and Physical [employer], D.O.    Patient Name: Luis Donovan MR#: [id number] Date of Birth: 1946-02-04 Date of Admission: 12/30/2022  Referring MD/NP/PA: Cancer Center Primary Care Physician: [facility]  Chief Complaint: No chief complaint on file.   HPI: Luis Donovan is a 77 y.o. male with a known history of known metastatic disease, unknown primary, anemia, CVA, HTN, GERD, HTN, HLD, PTSD, OSA presents to the emergency department for evaluation of weakness, AKI.  Patient was sent by palliative care for direct admission due to weakness and AKI.  Labs today show a K 5.4, Cr 2.01 up from 1.44, HgB around baseline at 8.7 He is awaiting cystoscopy and biopsy with urology.   Of note had COVID 12/18/22 and has had diminished appetite, poor PO intake since.   Patient denies fevers/chills, dizziness, chest pain, shortness of breath, N/V/C/D, abdominal pain, dysuria/frequency, changes in mental status.    Otherwise there has been no change in status. Patient has been taking medication as prescribed and there has been no recent change in medication or diet.  No recent antibiotics.  There has been no recent illness, hospitalizations, travel or sick contacts.    Review of Systems:  CONSTITUTIONAL: Positive weakness, poor appetite, PO intake. No fever/chills, fatigue, weight gain/loss, headache. EYES: No blurry or double vision. ENT: No tinnitus, postnasal drip, redness or soreness of the oropharynx. RESPIRATORY: No cough, dyspnea, wheeze.  No hemoptysis.  CARDIOVASCULAR: No chest pain, palpitations, syncope, orthopnea. No lower extremity edema.  GASTROINTESTINAL: No nausea, vomiting, abdominal pain, diarrhea, constipation.  No hematemesis, melena or hematochezia. GENITOURINARY: No dysuria, frequency, hematuria. ENDOCRINE: No polyuria or nocturia. No heat or cold intolerance. HEMATOLOGY: No anemia,  bruising, bleeding. INTEGUMENTARY: No rashes, ulcers, lesions. MUSCULOSKELETAL: No arthritis, gout. NEUROLOGIC: No numbness, tingling, ataxia, seizure-type activity, weakness. PSYCHIATRIC: No anxiety, depression, insomnia.   Past Medical History:  Diagnosis Date   Anemia    CVA (cerebral vascular accident) (HCC) 07/17/2013   Empyema (HCC)    Erectile dysfunction due to arterial insufficiency    Essential (primary) hypertension    Gastro-esophageal reflux disease with esophagitis    Head injury    Heart disease    Hyperlipidemia, unspecified    Hypertension    Obesity, unspecified    Pneumonia    Post-traumatic stress disorder, unspecified    Sleep apnea, unspecified     Past Surgical History:  Procedure Laterality Date   CARDIAC DEFIBRILLATOR PLACEMENT     LUNG SURGERY     operation on skin of neck     ROTATOR CUFF REPAIR Left    TENDON REPAIR     VIDEO ASSISTED THORACOSCOPY       reports that he has quit smoking. He has never used smokeless tobacco. He reports that he does not drink alcohol and does not use drugs.  No Known Allergies  No family history on file.  Prior to Admission medications   Medication Sig Start Date End Date Taking? Authorizing Provider  albuterol (VENTOLIN HFA) 108 (90 Base) MCG/ACT inhaler Inhale 2 puffs into the lungs every 4 (four) hours as needed for wheezing or shortness of breath. 07/16/22  Yes [provider]  albuterol (VENTOLIN HFA) 108 (90 Base) MCG/ACT inhaler Inhale 1-2 puffs into the lungs every 6 (six) hours as needed for wheezing or shortness of breath. 12/18/22  Yes Tanda Rockers A, DO  ARIPiprazole (ABILIFY) 2 MG tablet Take 2 mg  by mouth daily. 12/03/21  Yes [provider]  atorvastatin (LIPITOR) 80 MG tablet Take 40 mg by mouth daily. 06/04/22  Yes [provider]  Cholecalciferol 50 MCG (2000 UT) TABS Take 1 tablet by mouth every morning. 07/16/22  Yes [provider]  clotrimazole (LOTRIMIN) 1 %  cream Apply 1 Application topically 2 (two) times daily.   Yes [provider]  cyanocobalamin (VITAMIN B12) 500 MCG tablet Take 1 tablet by mouth daily. 07/16/22  Yes [provider]  diclofenac Sodium (VOLTAREN) 1 % GEL Apply 2 g topically 4 (four) times daily as needed (pain). 07/16/22  Yes [provider]  FLUoxetine (PROZAC) 20 MG capsule Take 80 mg by mouth daily. 03/03/22  Yes [provider]  guaiFENesin-dextromethorphan (ROBITUSSIN DM) 100-10 MG/5ML syrup Take 5 mLs by mouth every 4 (four) hours as needed for cough. 12/18/22  Yes Tanda Rockers A, DO  hydrALAZINE (APRESOLINE) 50 MG tablet Take 50 mg by mouth 3 (three) times daily. 07/16/22  Yes [provider]  ibuprofen (ADVIL,MOTRIN) 200 MG tablet Take 200 mg by mouth every 6 (six) hours as needed for moderate pain. For pain   Yes [provider]  KETOTIFEN FUMARATE OP Apply 1 drop to eye 2 (two) times daily as needed (allergies). 0.025%   Yes [provider]  latanoprost (XALATAN) 0.005 % ophthalmic solution Place 1 drop into both eyes at bedtime.   Yes [provider]  losartan (COZAAR) 100 MG tablet Take 100 mg by mouth daily. 06/04/22  Yes [provider]  metoprolol succinate (TOPROL-XL) 50 MG 24 hr tablet Take 0.5 tablets by mouth daily. 06/04/22  Yes [provider]  omeprazole (PRILOSEC) 20 MG capsule Take 20 mg by mouth daily.   Yes [provider]  ondansetron (ZOFRAN) 8 MG tablet Take 1 tablet (8 mg total) by mouth every 8 (eight) hours as needed for nausea or vomiting. 12/30/22  Yes Pickenpack-Cousar, Arty Baumgartner, NP  Oxycodone HCl 10 MG TABS Take 1 tablet (10 mg total) by mouth every 6 (six) hours as needed for up to 15 days. 12/30/22 01/14/23 Yes Pickenpack-Cousar, Arty Baumgartner, NP  polyethylene glycol (MIRALAX / GLYCOLAX) 17 g packet Take 17 g by mouth 2 (two) times daily. 12/15/22  Yes Ghimire, Lyndel Safe, MD  senna-docusate (SENOKOT-S) 8.6-50 MG  tablet Take 2 tablets by mouth at bedtime. 12/15/22 01/14/23 Yes Ghimire, Lyndel Safe, MD  timolol (TIMOPTIC-XR) 0.5 % ophthalmic gel-forming Apply 1 drop to eye daily. 08/13/22  Yes [provider]  torsemide (DEMADEX) 20 MG tablet Take by mouth. 11/05/22  Yes [provider]  traZODone (DESYREL) 50 MG tablet Take 50 mg by mouth at bedtime. 02/16/13  Yes [provider]  lidocaine (LIDODERM) 5 % Place 1 patch onto the skin daily. 07/16/22   [provider]    Physical Exam: There were no vitals filed for this visit.  GENERAL: 77 y.o.-year-old male patient, well-developed, well-nourished lying in the bed in no acute distress.  Pleasant and cooperative.   HEENT: Head atraumatic, normocephalic. Pupils equal. Mucus membranes moist. NECK: Supple. No JVD. CHEST: Normal breath sounds bilaterally. No wheezing, rales, rhonchi or crackles. No use of accessory muscles of respiration.  No reproducible chest wall tenderness.  CARDIOVASCULAR: S1, S2 normal. No murmurs, rubs, or gallops. Cap refill <2 seconds. Pulses intact distally.  ABDOMEN: Soft, nondistended, nontender. No rebound, guarding, rigidity. Normoactive bowel sounds present in all four quadrants.  EXTREMITIES: No pedal edema, cyanosis, or clubbing. No calf  tenderness or Homan's sign.  NEUROLOGIC: The patient is alert and oriented x 3. Cranial nerves II through XII are grossly intact with no focal sensorimotor deficit. PSYCHIATRIC:  Normal affect, mood, thought content. SKIN: Warm, dry, and intact without obvious rash, lesion, or ulcer.    Labs on Admission:  CBC: Recent Labs  Lab 12/30/22 0925  WBC 9.6  NEUTROABS 5.6  HGB 8.7*  HCT 27.5*  MCV 95.5  PLT 299   Basic Metabolic Panel: Recent Labs  Lab 12/30/22 0925  NA 139  K 5.4*  CL 103  CO2 30  GLUCOSE 119*  BUN 45*  CREATININE 2.01*  CALCIUM 8.4*   GFR: Estimated Creatinine Clearance: 36.7 mL/min (A) (by C-G formula based on SCr of 2.01 mg/dL  (H)). Liver Function Tests: Recent Labs  Lab 12/30/22 0925  AST 23  ALT 14  ALKPHOS 438*  BILITOT 0.5  PROT 7.2  ALBUMIN 3.7   No results for input(s): "LIPASE", "AMYLASE" in the last 168 hours. No results for input(s): "AMMONIA" in the last 168 hours. Coagulation Profile: No results for input(s): "INR", "PROTIME" in the last 168 hours. Cardiac Enzymes: No results for input(s): "CKTOTAL", "CKMB", "CKMBINDEX", "TROPONINI" in the last 168 hours. BNP (last 3 results) No results for input(s): "PROBNP" in the last 8760 hours. HbA1C: No results for input(s): "HGBA1C" in the last 72 hours. CBG: No results for input(s): "GLUCAP" in the last 168 hours. Lipid Profile: No results for input(s): "CHOL", "HDL", "LDLCALC", "TRIG", "CHOLHDL", "LDLDIRECT" in the last 72 hours. Thyroid Function Tests: No results for input(s): "TSH", "T4TOTAL", "FREET4", "T3FREE", "THYROIDAB" in the last 72 hours. Anemia Panel: No results for input(s): "VITAMINB12", "FOLATE", "FERRITIN", "TIBC", "IRON", "RETICCTPCT" in the last 72 hours. Urine analysis:    Component Value Date/Time   COLORURINE YELLOW 09/18/2010 1918   APPEARANCEUR CLOUDY (A) 09/18/2010 1918   LABSPEC 1.020 09/18/2010 1918   PHURINE 5.5 09/18/2010 1918   GLUCOSEU NEGATIVE 09/18/2010 1918   HGBUR TRACE (A) 09/18/2010 1918   BILIRUBINUR NEGATIVE 09/18/2010 1918   KETONESUR NEGATIVE 09/18/2010 1918   PROTEINUR 30 (A) 09/18/2010 1918   UROBILINOGEN 1.0 09/18/2010 1918   NITRITE NEGATIVE 09/18/2010 1918   LEUKOCYTESUR NEGATIVE 09/18/2010 1918   Sepsis Labs: @LABRCNTIP (procalcitonin:4,lacticidven:4) )No results found for this or any previous visit (from the past 240 hour(s)).   Radiological Exams on Admission: No results found.  EKG: Pending  Assessment/Plan  This is a 77 y.o. male with a history of known metastatic disease, unknown primary, anemia, CVA, GERD, HTN, HLD, PTSD, OSA  now being admitted with:  #. Acute kidney injury  likely 2/2 poor PO intake - Admit obs - IV fluids and repeat BMP in AM.  - Avoid nephrotoxic medications - Bladder scan and place foley catheter if evidence of urinary retention  #. Weakness likely multifactorial - PT eval  #. Acute on  chronic anemia - Check iron, ferritin, TIBC, B12, folate, guaiac  #. History of HTN - Continue hydralazine, losartan, metoprolol, torsemide  #. History of GERD - Continue Protonix for Prilosec  #. History of PTSD - Continue Abilify., Prozac  #. History of HLD - Continue atorvastatin  Admission status: Obs IV Fluids: LR Diet/Nutrition: Heart healthy Consults called: PT  DVT Px: Lovenox, SCDs and early ambulation. Code Status: Full Code  Disposition Plan: To be determined  All the records are reviewed and case discussed with ED provider. Management plans discussed with the patient and/or family who express understanding and agree with plan of  care.  Tonye Royalty D.O. on 12/30/2022 at 4:38 PM CC: Primary care physician; Clinic, [facility]   12/30/2022, 4:38 PM

## 2022-12-31 DIAGNOSIS — Z8673 Personal history of transient ischemic attack (TIA), and cerebral infarction without residual deficits: Secondary | ICD-10-CM | POA: Diagnosis not present

## 2022-12-31 DIAGNOSIS — Z7189 Other specified counseling: Secondary | ICD-10-CM

## 2022-12-31 DIAGNOSIS — Z6835 Body mass index (BMI) 35.0-35.9, adult: Secondary | ICD-10-CM | POA: Diagnosis not present

## 2022-12-31 DIAGNOSIS — G4733 Obstructive sleep apnea (adult) (pediatric): Secondary | ICD-10-CM | POA: Diagnosis present

## 2022-12-31 DIAGNOSIS — K5900 Constipation, unspecified: Secondary | ICD-10-CM

## 2022-12-31 DIAGNOSIS — K2100 Gastro-esophageal reflux disease with esophagitis, without bleeding: Secondary | ICD-10-CM | POA: Diagnosis present

## 2022-12-31 DIAGNOSIS — E785 Hyperlipidemia, unspecified: Secondary | ICD-10-CM | POA: Diagnosis not present

## 2022-12-31 DIAGNOSIS — C799 Secondary malignant neoplasm of unspecified site: Secondary | ICD-10-CM

## 2022-12-31 DIAGNOSIS — Z515 Encounter for palliative care: Secondary | ICD-10-CM

## 2022-12-31 DIAGNOSIS — Z95810 Presence of automatic (implantable) cardiac defibrillator: Secondary | ICD-10-CM | POA: Diagnosis not present

## 2022-12-31 DIAGNOSIS — R531 Weakness: Secondary | ICD-10-CM | POA: Diagnosis not present

## 2022-12-31 DIAGNOSIS — U099 Post covid-19 condition, unspecified: Secondary | ICD-10-CM | POA: Diagnosis present

## 2022-12-31 DIAGNOSIS — G893 Neoplasm related pain (acute) (chronic): Secondary | ICD-10-CM

## 2022-12-31 DIAGNOSIS — Z87891 Personal history of nicotine dependence: Secondary | ICD-10-CM | POA: Diagnosis not present

## 2022-12-31 DIAGNOSIS — F4310 Post-traumatic stress disorder, unspecified: Secondary | ICD-10-CM | POA: Diagnosis present

## 2022-12-31 DIAGNOSIS — Z7901 Long term (current) use of anticoagulants: Secondary | ICD-10-CM | POA: Diagnosis not present

## 2022-12-31 DIAGNOSIS — H409 Unspecified glaucoma: Secondary | ICD-10-CM | POA: Diagnosis not present

## 2022-12-31 DIAGNOSIS — Z79899 Other long term (current) drug therapy: Secondary | ICD-10-CM | POA: Diagnosis not present

## 2022-12-31 DIAGNOSIS — I252 Old myocardial infarction: Secondary | ICD-10-CM | POA: Diagnosis not present

## 2022-12-31 DIAGNOSIS — I10 Essential (primary) hypertension: Secondary | ICD-10-CM | POA: Diagnosis not present

## 2022-12-31 DIAGNOSIS — E669 Obesity, unspecified: Secondary | ICD-10-CM | POA: Diagnosis not present

## 2022-12-31 DIAGNOSIS — D630 Anemia in neoplastic disease: Secondary | ICD-10-CM | POA: Diagnosis not present

## 2022-12-31 DIAGNOSIS — N179 Acute kidney failure, unspecified: Secondary | ICD-10-CM | POA: Diagnosis not present

## 2022-12-31 LAB — BASIC METABOLIC PANEL
Anion gap: 9 (ref 5–15)
BUN: 37 mg/dL — ABNORMAL HIGH (ref 8–23)
CO2: 24 mmol/L (ref 22–32)
Calcium: 8.3 mg/dL — ABNORMAL LOW (ref 8.9–10.3)
Chloride: 101 mmol/L (ref 98–111)
Creatinine, Ser: 1.72 mg/dL — ABNORMAL HIGH (ref 0.61–1.24)
GFR, Estimated: 41 mL/min — ABNORMAL LOW (ref >60)
Glucose, Bld: 94 mg/dL (ref 70–99)
Potassium: 4.7 mmol/L (ref 3.5–5.1)
Sodium: 134 mmol/L — ABNORMAL LOW (ref 135–145)

## 2022-12-31 LAB — CBC
HCT: 23.2 % — ABNORMAL LOW (ref 39.0–52.0)
Hemoglobin: 7.4 g/dL — ABNORMAL LOW (ref 13.0–17.0)
MCH: 30.2 pg (ref 26.0–34.0)
MCHC: 31.9 g/dL (ref 30.0–36.0)
MCV: 94.7 fL (ref 80.0–100.0)
Platelets: 369 10*3/uL (ref 150–400)
RBC: 2.45 MIL/uL — ABNORMAL LOW (ref 4.22–5.81)
RDW: 15.5 % (ref 11.5–15.5)
WBC: 8 10*3/uL (ref 4.0–10.5)
nRBC: 0.6 % — ABNORMAL HIGH (ref 0.0–0.2)

## 2022-12-31 MED ORDER — SENNOSIDES-DOCUSATE SODIUM 8.6-50 MG PO TABS
2.0000 | ORAL_TABLET | Freq: Two times a day (BID) | ORAL | Status: DC
  Administered 2022-12-31 – 2023-01-01 (×3): 2 via ORAL
  Filled 2022-12-31 (×3): qty 2

## 2022-12-31 MED ORDER — OXYCODONE HCL 5 MG PO TABS
5.0000 mg | ORAL_TABLET | ORAL | Status: DC | PRN
  Administered 2022-12-31 – 2023-01-01 (×3): 10 mg via ORAL
  Filled 2022-12-31 (×3): qty 2

## 2022-12-31 MED ORDER — ALUM & MAG HYDROXIDE-SIMETH 200-200-20 MG/5ML PO SUSP
30.0000 mL | ORAL | Status: DC | PRN
  Administered 2022-12-31 (×4): 30 mL via ORAL
  Filled 2022-12-31 (×4): qty 30

## 2022-12-31 MED ORDER — APIXABAN 5 MG PO TABS
5.0000 mg | ORAL_TABLET | Freq: Two times a day (BID) | ORAL | Status: DC
  Administered 2022-12-31 – 2023-01-01 (×2): 5 mg via ORAL
  Filled 2022-12-31 (×2): qty 1

## 2022-12-31 NOTE — Progress Notes (Signed)
PROGRESS NOTE    Luis Donovan  UXL:[id number] DOB: 1946/02/16 DOA: 12/30/2022 PCP: [facility]   Brief Narrative: Luis Donovan is a 77 y.o. male with a history of  metastatic disease, unknown primary, anemia, CVA, HTN, GERD, HTN, HLD, PTSD, OSA presents to the emergency department for evaluation of weakness, AKI.  Patient was sent by palliative care for direct admission due to weakness and AKI.  Labs today show a K 5.4, Cr 2.01 up from 1.44, HgB around baseline at 8.7 He is awaiting cystoscopy and biopsy with urology.  He had COVID 12/18/2022 with decreased appetite and poor p.o. intake since then.  Denied fever or chills chest pain shortness of breath nausea vomiting diarrhea urinary complaints.  Patient lives at home with his wife.  He moves around with a cane.  He has history of stroke.  Assessment & Plan:   Principal Problem:   AKI (acute kidney injury) (HCC)  #1 AKI secondary to decreased appetite due to recent COVID Renal functions improving with IV fluids overnight but not yet back to his baseline. Creatinine 1.72 from 2.01 Continue fluids overnight and recheck labs in AM.  #2 generalized weakness await PT evaluation likely will need home health PT  #3 essential hypertension Blood pressure 156/70 on hydralazine losartan metoprolol and torsemide Hold the torsemide while he is getting hydrated  #4 history of GERD continue Prilosec  #5 hyperlipidemia continue statin  #6 PTSD Prozac, Abilify, trazodone  #7 glaucoma continue Xalatan eyedrops  Estimated body mass index is 35.5 kg/m as calculated from the following:   Height as of this encounter: 5\' 7"  (1.702 m).   Weight as of this encounter: 102.8 kg.  DVT prophylaxis: ELIQUIS Code Status:FULL Family Communication: Called wife with no response Disposition Plan:  Status is:IP   Consultants: Palliative  Procedures: None Antimicrobials: None  Subjective: Feels better than yesterday wife at bedside Has not  gotten out of bed yet Denies nausea vomiting or diarrhea  Objective: Vitals:   12/30/22 2058 12/31/22 0057 12/31/22 0143 12/31/22 0631  BP: 139/74 (!) 178/94 137/78 (!) 156/70  Pulse: 63 72 68 71  Resp: 16 14    Temp: 98.6 F (37 C) 98.9 F (37.2 C)    TempSrc: Oral Oral    SpO2: 99% 99%    Weight:      Height:        Intake/Output Summary (Last 24 hours) at 12/31/2022 1120 Last data filed at 12/31/2022 1037 Gross per 24 hour  Intake 600 ml  Output 750 ml  Net -150 ml   Filed Weights   12/30/22 1700  Weight: 102.8 kg    Examination:  General exam: Appears in no acute distress Respiratory system: Clear to auscultation. Respiratory effort normal. Cardiovascular system: S1 & S2 heard, RRR. No JVD, murmurs, rubs, gallops or clicks. No pedal edema. Gastrointestinal system: Abdomen is nondistended, soft and nontender. No organomegaly or masses felt. Normal bowel sounds heard. Central nervous system: Alert and oriented. No focal neurological deficits. Extremities: No edema    Data Reviewed: I have personally reviewed following labs and imaging studies  CBC: Recent Labs  Lab 12/30/22 0925 12/30/22 1817 12/31/22 0538  WBC 9.6 8.0 8.0  NEUTROABS 5.6  --   --   HGB 8.7* 7.9* 7.4*  HCT 27.5* 25.2* 23.2*  MCV 95.5 95.5 94.7  PLT 299 324 369   Basic Metabolic Panel: Recent Labs  Lab 12/30/22 0925 12/30/22 1817 12/31/22 0538  NA 139  --  134*  K 5.4*  --  4.7  CL 103  --  101  CO2 30  --  24  GLUCOSE 119*  --  94  BUN 45*  --  37*  CREATININE 2.01* 2.04* 1.72*  CALCIUM 8.4*  --  8.3*   GFR: Estimated Creatinine Clearance: 43.1 mL/min (A) (by C-G formula based on SCr of 1.72 mg/dL (H)). Liver Function Tests: Recent Labs  Lab 12/30/22 0925  AST 23  ALT 14  ALKPHOS 438*  BILITOT 0.5  PROT 7.2  ALBUMIN 3.7   No results for input(s): "LIPASE", "AMYLASE" in the last 168 hours. No results for input(s): "AMMONIA" in the last 168 hours. Coagulation  Profile: No results for input(s): "INR", "PROTIME" in the last 168 hours. Cardiac Enzymes: No results for input(s): "CKTOTAL", "CKMB", "CKMBINDEX", "TROPONINI" in the last 168 hours. BNP (last 3 results) No results for input(s): "PROBNP" in the last 8760 hours. HbA1C: No results for input(s): "HGBA1C" in the last 72 hours. CBG: No results for input(s): "GLUCAP" in the last 168 hours. Lipid Profile: No results for input(s): "CHOL", "HDL", "LDLCALC", "TRIG", "CHOLHDL", "LDLDIRECT" in the last 72 hours. Thyroid Function Tests: No results for input(s): "TSH", "T4TOTAL", "FREET4", "T3FREE", "THYROIDAB" in the last 72 hours. Anemia Panel: Recent Labs    12/30/22 2125  VITAMINB12 2,233*  FOLATE 16.0  FERRITIN 509*  TIBC 238*  IRON 52   Sepsis Labs: No results for input(s): "PROCALCITON", "LATICACIDVEN" in the last 168 hours.  No results found for this or any previous visit (from the past 240 hour(s)).       Radiology Studies: No results found.      Scheduled Meds:  ARIPiprazole  2 mg Oral Daily   atorvastatin  40 mg Oral Daily   cholecalciferol  2,000 Units Oral q morning   cyanocobalamin  500 mcg Oral Daily   enoxaparin (LOVENOX) injection  50 mg Subcutaneous Q24H   FLUoxetine  80 mg Oral Daily   hydrALAZINE  50 mg Oral TID   latanoprost  1 drop Both Eyes QHS   losartan  100 mg Oral Daily   metoprolol succinate  25 mg Oral Daily   pantoprazole  40 mg Oral Daily   polyethylene glycol  17 g Oral BID   senna-docusate  2 tablet Oral QHS   sodium chloride flush  3 mL Intravenous Q12H   torsemide  20 mg Oral Daily   traZODone  50 mg Oral QHS   Continuous Infusions:  sodium chloride 75 mL/hr at 12/31/22 0630     LOS: 0 days    Time spent: 39 minutes  Alwyn Ren, MD  12/31/2022, 11:20 AM

## 2022-12-31 NOTE — Consult Note (Signed)
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  Patient had been seen at the palliative medicine clinic at [facility] and was recommended to come over for evaluation due to this weakness and noted AKI.  Of note patient had been positive for COVID on 12/18/22 and has had a diminished appetite and poor oral take since.  Palliative medicine team consulted to assist with complex medical decision making.  Reviewed EMR prior to presenting to bedside.  Also discussed care with palliative medicine outpatient provider at [facility] to coordinate care.  Patient was establishing with palliative medicine clinic yesterday when sent to ER.  Patient and family have stated wanting to undergo further workup for what is known to be metastatic cancer to determine possible pathways for medical care moving forward including possible therapy options.  Patient supposed to have follow-up next week with urology to discuss this. At time of EMR review patient has received hydrocodone acetaminophen 5-325 mg 1-2 tablets every 4 hours as needed x 1 dose.  Presented to bedside to meet with patient.  Introduced myself as a member of the palliative medicine team and colleague of outpatient palliative medicine provider at [facility].  Will to discuss patient's admission for weakness.  Patient feels that his symptoms are improving since admission.  Patient notes he has been having fatigue for weeks.   Patient feels that his pain has been managed on his outpatient medication of oxycodone; will change inpatient medication to match outpatient meds.  Patient's only symptom concern was regarding constipation to which patient noted he has not had a bowel movement in a few days.  Discussed will adjust bowel regimen accordingly.  Patient has heard he has metastatic cancer and is awaiting further workup to determine the primary source.  Patient has already discussed with providers and his family plan to workup cancer to determine possible therapies available for management moving forward.  Acknowledged wishes regarding this medical plan. All questions answered at that time.  Noted palliative medicine team will continue to follow along during patient's medical journey.  Discussed care with IDT including RN and hospitalist after visit with patient.  Primary Diagnoses  Present on Admission:  AKI (acute kidney injury) (HCC)   Palliative Review of Systems: Obstipation  Past Medical History:  Diagnosis Date   Anemia    CVA (cerebral vascular accident) (HCC) 07/17/2013   Empyema (HCC)    Erectile dysfunction due to arterial insufficiency    Essential (primary) hypertension    Gastro-esophageal reflux disease with esophagitis    Head injury    Heart disease    Hyperlipidemia, unspecified    Hypertension    Obesity, unspecified    Pneumonia    Post-traumatic stress disorder, unspecified    Sleep apnea, unspecified    Social History   Socioeconomic History   Marital status: Married    Spouse name: beverly   Number of children: 4   Years of education: Not on file   Highest education  level: Not on file  Occupational History   Occupation: retired    Associate Professor: RETIRED  Tobacco Use   Smoking status: Former   Smokeless tobacco: Never  Advertising account planner   Vaping status: Never Used  Substance and Sexual Activity   Alcohol use: No   Drug use: No   Sexual activity: Never    Birth  control/protection: None  Other Topics Concern   Not on file  Social History Narrative   Not on file   Social Determinants of Health   Financial Resource Strain: Not on file  Food Insecurity: No Food Insecurity (12/30/2022)   Hunger Vital Sign    Worried About Running Out of Food in the Last Year: Never true    Ran Out of Food in the Last Year: Never true  Transportation Needs: No Transportation Needs (12/30/2022)   PRAPARE - Administrator, Civil Service (Medical): No    Lack of Transportation (Non-Medical): No  Physical Activity: Not on file  Stress: Not on file  Social Connections: Not on file   History reviewed. No pertinent family history. Scheduled Meds:  ARIPiprazole  2 mg Oral Daily   atorvastatin  40 mg Oral Daily   cholecalciferol  2,000 Units Oral q morning   cyanocobalamin  500 mcg Oral Daily   enoxaparin (LOVENOX) injection  50 mg Subcutaneous Q24H   FLUoxetine  80 mg Oral Daily   hydrALAZINE  50 mg Oral TID   latanoprost  1 drop Both Eyes QHS   losartan  100 mg Oral Daily   metoprolol succinate  25 mg Oral Daily   pantoprazole  40 mg Oral Daily   polyethylene glycol  17 g Oral BID   senna-docusate  2 tablet Oral QHS   sodium chloride flush  3 mL Intravenous Q12H   torsemide  20 mg Oral Daily   traZODone  50 mg Oral QHS   Continuous Infusions:  sodium chloride 75 mL/hr at 12/31/22 0630   PRN Meds:.acetaminophen **OR** acetaminophen, albuterol, alum & mag hydroxide-simeth, guaiFENesin-dextromethorphan, hydrALAZINE, HYDROcodone-acetaminophen, morphine injection, ondansetron **OR** ondansetron (ZOFRAN) IV No Known Allergies CBC:    Component Value Date/Time   WBC 8.0 12/31/2022 0538   HGB 7.4 (L) 12/31/2022 0538   HGB 8.7 (L) 12/30/2022 0925   HCT 23.2 (L) 12/31/2022 0538   PLT 369 12/31/2022 0538   PLT 299 12/30/2022 0925   MCV 94.7 12/31/2022 0538   NEUTROABS 5.6 12/30/2022 0925   LYMPHSABS 2.5 12/30/2022 0925   MONOABS 0.9 12/30/2022 0925    EOSABS 0.3 12/30/2022 0925   BASOSABS 0.0 12/30/2022 0925   Comprehensive Metabolic Panel:    Component Value Date/Time   NA 134 (L) 12/31/2022 0538   K 4.7 12/31/2022 0538   CL 101 12/31/2022 0538   CO2 24 12/31/2022 0538   BUN 37 (H) 12/31/2022 0538   CREATININE 1.72 (H) 12/31/2022 0538   CREATININE 2.01 (H) 12/30/2022 0925   GLUCOSE 94 12/31/2022 0538   CALCIUM 8.3 (L) 12/31/2022 0538   AST 23 12/30/2022 0925   ALT 14 12/30/2022 0925   ALKPHOS 438 (H) 12/30/2022 0925   BILITOT 0.5 12/30/2022 0925   PROT 7.2 12/30/2022 0925   ALBUMIN 3.7 12/30/2022 0925    Physical Exam: Vital Signs: BP (!) 156/70 (BP Location: Right Arm)   Pulse 71   Temp 98.9 F (37.2 C) (Oral)   Resp 14   Ht 5\' 7"  (1.702 m)   Wt 102.8 kg   SpO2 99%  BMI 35.50 kg/m  SpO2: SpO2: 99 % O2 Device: O2 Device: Room Air O2 Flow Rate:   Intake/output summary:  Intake/Output Summary (Last 24 hours) at 12/31/2022 1914 Last data filed at 12/31/2022 0848 Gross per 24 hour  Intake 360 ml  Output 750 ml  Net -390 ml   LBM: Last BM Date : 12/29/22 Baseline Weight: Weight: 102.8 kg Most recent weight: Weight: 102.8 kg  General: NAD, alert, laying in bed, pleasant Eyes: No drainage noted HENT: moist mucous membranes Cardiovascular: RRR Respiratory: no increased work of breathing noted, not in respiratory distress Neuro: A&Ox4, following commands easily Psych: appropriately answers all questions          Palliative Performance Scale: 70%              Additional Data Reviewed: Recent Labs    12/30/22 0925 12/30/22 1817 12/31/22 0538  WBC 9.6 8.0 8.0  HGB 8.7* 7.9* 7.4*  PLT 299 324 369  NA 139  --  134*  BUN 45*  --  37*  CREATININE 2.01* 2.04* 1.72*    Imaging: DG Chest 2 View CLINICAL DATA:  Shortness of breath.  EXAM: CHEST - 2 VIEW  COMPARISON:  December 13, 2022.  FINDINGS: Stable cardiomediastinal silhouette. Left-sided defibrillator is unchanged. Left shoulder arthroplasty is  noted. Degenerative changes seen involving the right shoulder. No acute pulmonary disease. Bony thorax is unremarkable.  IMPRESSION: No active cardiopulmonary disease.  Electronically Signed   By: Lupita Raider M.D.   On: 12/18/2022 15:41    I personally reviewed recent imaging.   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  Patient had been seen at the palliative medicine clinic at [facility] and was recommended to come over for evaluation due to this weakness and noted AKI.  Of note patient had been positive for COVID on 12/18/22 and has had a diminished appetite and poor oral take since.  Palliative medicine team consulted to assist with complex medical decision making.  # Complex medical decision making/goals of care  -Patient has heard diagnosis of metastatic cancer though is awaiting answers regarding primary source. Needs biopsy performed.  Patient and family have already stated to providers that patient wants to undergo workup to determine further cancer therapies that would be available.  Supposed to follow-up with urology next week.  -  Code Status: Full Code   # Symptom management  -Pain/Dyspnea, acute in setting of metastatic cancer with unknown primary   -Discontinue hydrocodone-acetaminophen -Start home medication of oxycodone 5-10mg  q4hrs prn    -Constipation   -Increase senna to 2 tabs BID   -Continue Miralax 17gm BID  # Psycho-social/Spiritual Support:  - Support System: wife, daughter  # Discharge Planning:   Home; continued follow up with PMT at [facility]  Thank you for allowing the palliative care team to  participate in the care Alveria Apley.  Alvester Morin, DO Palliative Care Provider PMT # [phone]  If patient remains symptomatic despite maximum doses, please call PMT at [phone] between 0700 and 1900. Outside of these hours, please call attending, as PMT does not have night coverage.  This provider spent a total of 80 minutes providing  patient's care.  Includes review of EMR, discussing care with other staff members involved in patient's medical care, obtaining relevant history and information from patient and/or patient's family, and personal review of imaging and lab work. Greater than 50% of the time was spent counseling and coordinating care related to the above assessment and plan.    *Please note that this is a verbal dictation therefore any spelling or grammatical errors are due to the "[employer] One" system interpretation.

## 2022-12-31 NOTE — Evaluation (Signed)
Physical Therapy Evaluation Patient Details Name: Luis Donovan MRN: [id number] DOB: January 15, 1946 Today's Date: 12/31/2022  History of Present Illness  77 y.o. male with a known history of known metastatic disease, unknown primary, anemia, CVA, HTN, GERD, HTN, HLD, PTSD, OSA presents to the emergency department for evaluation of weakness, AKI.  Patient was sent by palliative care for direct admission due to weakness and AKI.  Labs today show a K 5.4, Cr 2.01 up from 1.44, HgB around baseline at 8.7 He is awaiting cystoscopy and biopsy with urology. Of note, pt also had COVID 12/18/22  Clinical Impression  Pt admitted with above diagnosis.  Pt currently with functional limitations due to the deficits listed below (see PT Problem List). Pt will benefit from acute skilled PT to increase their independence and safety with mobility to allow discharge.  Pt overall feeling more weakness and fatigue but agreeable to mobilize.  Pt would benefit from use of RW to improve stability and for pain control.         If plan is discharge home, recommend the following: A little help with walking and/or transfers;A little help with bathing/dressing/bathroom;Help with stairs or ramp for entrance;Assistance with cooking/housework   Can travel by private vehicle        Equipment Recommendations Rolling walker (2 wheels)  Recommendations for Other Services       Functional Status Assessment Patient has had a recent decline in their functional status and demonstrates the ability to make significant improvements in function in a reasonable and predictable amount of time.     Precautions / Restrictions Precautions Precautions: Fall      Mobility  Bed Mobility Overal bed mobility: Needs Assistance Bed Mobility: Supine to Sit     Supine to sit: Contact guard, Used rails, HOB elevated     General bed mobility comments: increased difficulty but no physical assist required    Transfers Overall transfer  level: Needs assistance Equipment used: Straight cane Transfers: Sit to/from Stand Sit to Stand: Contact guard assist           General transfer comment: close guard for safety    Ambulation/Gait Ambulation/Gait assistance: Contact guard assist, Min assist Gait Distance (Feet): 100 Feet Assistive device: Straight cane Gait Pattern/deviations: Decreased stance time - left, Step-to pattern, Antalgic       General Gait Details: pt with antalgic gait and reports bil knee pain, distance limited due to pain, encouraged use of RW for pain control at this time  Stairs            Wheelchair Mobility     Tilt Bed    Modified Rankin (Stroke Patients Only)       Balance Overall balance assessment:  (denies any recent falls)                                           Pertinent Vitals/Pain Pain Assessment Pain Assessment: 0-10 Pain Score: 8  Pain Location: bil knees Pain Descriptors / Indicators: Sore, Aching Pain Intervention(s): Repositioned, Monitored during session, Patient requesting pain meds-RN notified    Home Living Family/patient expects to be discharged to:: Private residence Living Arrangements: Spouse/significant other             Home Layout: One level Home Equipment: Cane - single point      Prior Function Prior Level of Function : Independent/Modified Independent  Extremity/Trunk Assessment        Lower Extremity Assessment Lower Extremity Assessment: Generalized weakness       Communication   Communication Communication: No apparent difficulties  Cognition Arousal: Alert Behavior During Therapy: WFL for tasks assessed/performed Overall Cognitive Status: Within Functional Limits for tasks assessed                                          General Comments      Exercises     Assessment/Plan    PT Assessment Patient needs continued PT services  PT Problem List  Decreased mobility;Decreased activity tolerance;Decreased strength;Decreased balance;Decreased knowledge of use of DME;Pain       PT Treatment Interventions DME instruction;Gait training;Balance training;Stair training;Functional mobility training;Therapeutic activities;Therapeutic exercise;Patient/family education    PT Goals (Current goals can be found in the Care Plan section)  Acute Rehab PT Goals PT Goal Formulation: With patient Time For Goal Achievement: 01/14/23 Potential to Achieve Goals: Good    Frequency Min 1X/week     Co-evaluation               AM-PAC PT "6 Clicks" Mobility  Outcome Measure Help needed turning from your back to your side while in a flat bed without using bedrails?: A Little Help needed moving from lying on your back to sitting on the side of a flat bed without using bedrails?: A Little Help needed moving to and from a bed to a chair (including a wheelchair)?: A Little Help needed standing up from a chair using your arms (e.g., wheelchair or bedside chair)?: A Little Help needed to walk in hospital room?: A Little Help needed climbing 3-5 steps with a railing? : A Lot 6 Click Score: 17    End of Session Equipment Utilized During Treatment: Gait belt Activity Tolerance: Patient tolerated treatment well Patient left: in chair;with call bell/phone within reach;with chair alarm set;with family/visitor present Nurse Communication: Mobility status;Patient requests pain meds PT Visit Diagnosis: Difficulty in walking, not elsewhere classified (R26.2);Muscle weakness (generalized) (M62.81)    Time: 4098-1191 PT Time Calculation (min) (ACUTE ONLY): 11 min   Charges:   PT Evaluation $PT Eval Low Complexity: 1 Low   PT General Charges $$ ACUTE PT VISIT: 1 Visit        Thomasene Mohair PT, DPT Physical Therapist Acute Rehabilitation Services Office: [phone]  Kati L Payson 12/31/2022, 12:00 PM

## 2023-01-01 DIAGNOSIS — N179 Acute kidney failure, unspecified: Secondary | ICD-10-CM | POA: Diagnosis not present

## 2023-01-01 DIAGNOSIS — Z515 Encounter for palliative care: Secondary | ICD-10-CM | POA: Diagnosis not present

## 2023-01-01 DIAGNOSIS — K5900 Constipation, unspecified: Secondary | ICD-10-CM | POA: Diagnosis not present

## 2023-01-01 DIAGNOSIS — G893 Neoplasm related pain (acute) (chronic): Secondary | ICD-10-CM | POA: Diagnosis not present

## 2023-01-01 DIAGNOSIS — Z79899 Other long term (current) drug therapy: Secondary | ICD-10-CM

## 2023-01-01 MED ORDER — OXYCODONE HCL 5 MG PO TABS: 10.0000 mg | ORAL_TABLET | ORAL | Status: DC | PRN

## 2023-01-01 MED ORDER — OXYCODONE HCL 10 MG PO TABS
10.0000 mg | ORAL_TABLET | ORAL | 0 refills | Status: DC | PRN
Start: 2023-01-01 — End: 2023-01-13

## 2023-01-01 MED ORDER — LOSARTAN POTASSIUM 100 MG PO TABS: 50.0000 mg | ORAL_TABLET | Freq: Every day | ORAL | 2 refills | Status: DC

## 2023-01-01 MED ORDER — ACETAMINOPHEN 325 MG PO TABS: 650.0000 mg | ORAL_TABLET | Freq: Four times a day (QID) | ORAL | Status: DC | PRN

## 2023-01-01 MED ORDER — TORSEMIDE 20 MG PO TABS: 20.0000 mg | ORAL_TABLET | Freq: Two times a day (BID) | ORAL | 0 refills | Status: DC

## 2023-01-01 NOTE — Discharge Summary (Signed)
Physician Discharge Summary  Luis Donovan:[id number] DOB: [birth year] DOA: 12/30/2022  PCP: [facility]  Admit date: 12/30/2022 Discharge date: 01/01/2023  Admitted From: Home Disposition: Home  Recommendations for Outpatient Follow-up:  Follow up with PCP in 1-2 weeks Please obtain BMP/CBC in one week  Home Health: Yes  Equipment/Devices: None  Discharge Condition: STAble CODE STATUS: Full code Diet recommendation: Cardiac  Brief/Interim Summary: Luis Donovan is a 77 y.o. male with a history of  metastatic disease, unknown primary, anemia, CVA, HTN, GERD, HTN, HLD, PTSD, OSA presents to the emergency department for evaluation of weakness, AKI.  Patient was sent by palliative care for direct admission due to weakness and AKI.  Labs today show a K 5.4, Cr 2.01 up from 1.44, HgB around baseline at 8.7 He is awaiting cystoscopy and biopsy with urology.  He had COVID 12/18/2022 with decreased appetite and poor p.o. intake since then.  Denied fever or chills chest pain shortness of breath nausea vomiting diarrhea urinary complaints.  Patient lives at home with his wife.  He moves around with a cane.  He has history of stroke.    Discharge Diagnoses:  Principal Problem:   AKI (acute kidney injury) [facility]) Active Problems:   Palliative care encounter   Goals of care, counseling/discussion   Cancer associated pain   Counseling and coordination of care   Constipation   Medication management    #1 AKI secondary to decreased appetite due to recent COVID Renal functions returned back to baseline with IV fluids.     #2 generalized weakness DC home with home health PT   #3 essential hypertension continue home medications   #4 history of GERD continue Prilosec   #5 hyperlipidemia continue statin   #6 PTSD Prozac, Abilify, trazodone   #7 glaucoma continue Xalatan eyedrops  #8 malignant metastasis with unknown primary he is aware he needs to follow-up with Dr. Ronne Binning  urologist for cystoscopy and biopsy in [place].  Palliative care followed the patient during the hospital stay.   Estimated body mass index is 35.5 kg/m as calculated from the following:   Height as of this encounter: 5\' 7"  (1.702 m).   Weight as of this encounter: 102.8 kg.  Discharge Instructions  Discharge Instructions     Diet - low sodium heart healthy   Complete by: As directed    Increase activity slowly   Complete by: As directed       Allergies as of 01/01/2023   No Known Allergies      Medication List     STOP taking these medications    ibuprofen 200 MG tablet Commonly known as: ADVIL   molnupiravir EUA 200 mg Caps capsule Commonly known as: LAGEVRIO       TAKE these medications    acetaminophen 325 MG tablet Commonly known as: TYLENOL Take 2 tablets (650 mg total) by mouth every 6 (six) hours as needed for mild pain (or Fever >/= 101).   albuterol 108 (90 Base) MCG/ACT inhaler Commonly known as: VENTOLIN HFA Inhale 2 puffs into the lungs every 4 (four) hours as needed for wheezing or shortness of breath.   atorvastatin 80 MG tablet Commonly known as: LIPITOR Take 40 mg by mouth daily.   Carboxymethylcellulose Sod PF 0.5 % Soln Place 1 drop into both eyes 2 (two) times daily. For dry eyes   cetirizine 10 MG tablet Commonly known as: ZYRTEC Take 10 mg by mouth at bedtime.   Cholecalciferol 50 MCG (2000  UT) Tabs Take 1 tablet by mouth every morning.   clotrimazole 1 % cream Commonly known as: LOTRIMIN Apply 1 Application topically 2 (two) times daily. Apply a small amount to affected area twice daily for feet.   cyanocobalamin 500 MCG tablet Commonly known as: VITAMIN B12 Take 1 tablet by mouth daily.   diclofenac Sodium 1 % Gel Commonly known as: VOLTAREN Apply 2 g topically 4 (four) times daily as needed (pain).   Eliquis 5 MG Tabs tablet Generic drug: apixaban Take 5 mg by mouth 2 (two) times daily.   eucerin cream Apply 1  Application topically 2 (two) times daily. Apply a small amount to affected area twice daily   FLUoxetine 20 MG capsule Commonly known as: PROZAC Take 80 mg by mouth daily.   guaiFENesin-dextromethorphan 100-10 MG/5ML syrup Commonly known as: ROBITUSSIN DM Take 5 mLs by mouth every 4 (four) hours as needed for cough.   hydrALAZINE 50 MG tablet Commonly known as: APRESOLINE Take 50 mg by mouth 3 (three) times daily.   KETOTIFEN FUMARATE OP Apply 1 drop to eye 2 (two) times daily as needed (allergies). 0.025%   latanoprost 0.005 % ophthalmic solution Commonly known as: XALATAN Place 1 drop into both eyes at bedtime.   lidocaine 5 % Commonly known as: LIDODERM Place 1 patch onto the skin daily. Apply 1 patch to skin once daily (Apply for  hours, then remove for  hours)   losartan 100 MG tablet Commonly known as: COZAAR Take 0.5 tablets (50 mg total) by mouth daily. Start taking on: January 04, 2023 What changed:  how much to take These instructions start on January 04, 2023. If you are unsure what to do until then, ask your doctor or other care provider.   metoprolol succinate 50 MG 24 hr tablet Commonly known as: TOPROL-XL Take 0.5 tablets by mouth daily.   omeprazole 20 MG capsule Commonly known as: PRILOSEC Take 20 mg by mouth 2 (two) times daily before a meal. Take on an empty stomach 30 minutes prior to a meal   ondansetron 8 MG tablet Commonly known as: ZOFRAN Take 1 tablet (8 mg total) by mouth every 8 (eight) hours as needed for nausea or vomiting.   Oxycodone HCl 10 MG Tabs Take 1 tablet (10 mg total) by mouth every 4 (four) hours as needed for up to 15 days. What changed: when to take this   polyethylene glycol 17 g packet Commonly known as: MIRALAX / GLYCOLAX Take 17 g by mouth 2 (two) times daily.   senna-docusate 8.6-50 MG tablet Commonly known as: Senokot-S Take 2 tablets by mouth at bedtime.   timolol 0.5 % ophthalmic gel-forming Commonly known as:  TIMOPTIC-XR Apply 1 drop to eye daily.   torsemide 20 MG tablet Commonly known as: DEMADEX Take 1 tablet (20 mg total) by mouth 2 (two) times daily. For Fluid Start taking on: January 05, 2023 What changed:  how much to take These instructions start on January 05, 2023. If you are unsure what to do until then, ask your doctor or other care provider.   traZODone 50 MG tablet Commonly known as: DESYREL Take 50 mg by mouth at bedtime.        No Known Allergies  Consultations: None   Procedures/Studies: DG Chest 2 View  Result Date: 12/18/2022 CLINICAL DATA:  Shortness of breath. EXAM: CHEST - 2 VIEW COMPARISON:  December 13, 2022. FINDINGS: Stable cardiomediastinal silhouette. Left-sided defibrillator is unchanged. Left shoulder arthroplasty is noted.  Degenerative changes seen involving the right shoulder. No acute pulmonary disease. Bony thorax is unremarkable. IMPRESSION: No active cardiopulmonary disease. Electronically Signed   By: Lupita Raider M.D.   On: 12/18/2022 15:41   CT Angio Chest/Abd/Pel for Dissection W and/or Wo Contrast  Result Date: 12/13/2022 CLINICAL DATA:  Chest pain.  Back pain. EXAM: CT ANGIOGRAPHY CHEST, ABDOMEN AND PELVIS TECHNIQUE: Non-contrast CT of the chest was initially obtained. Multidetector CT imaging through the chest, abdomen and pelvis was performed using the standard protocol during bolus administration of intravenous contrast. Multiplanar reconstructed images and MIPs were obtained and reviewed to evaluate the vascular anatomy. RADIATION DOSE REDUCTION: This exam was performed according to the departmental dose-optimization program which includes automated exposure control, adjustment of the mA and/or kV according to patient size and/or use of iterative reconstruction technique. CONTRAST:  80mL OMNIPAQUE IOHEXOL 350 MG/ML SOLN COMPARISON:  September 15, 2010. FINDINGS: CTA CHEST FINDINGS Cardiovascular: Atherosclerosis of thoracic aorta is noted without  aneurysm or dissection. Normal cardiac size. No pericardial effusion. Coronary artery calcifications are noted. Mediastinum/Nodes: Thyroid gland and esophagus are unremarkable. Multiple enlarged lymph nodes are seen along the left side of the superior mediastinum, with the largest measuring 12 mm. Lungs/Pleura: No pneumothorax or pleural effusion is noted. Minimal emphysematous disease is noted in the lower lobes. Minimal bibasilar scarring is noted as well. Musculoskeletal: Diffuse sclerotic densities are noted throughout the thoracic spine and ribs and sternum consistent with osseous metastases. Review of the MIP images confirms the above findings. CTA ABDOMEN AND PELVIS FINDINGS VASCULAR Aorta: Atherosclerosis of abdominal aorta is noted without aneurysm or dissection. Celiac: Patent without evidence of aneurysm, dissection, vasculitis or significant stenosis. SMA: Patent without evidence of aneurysm, dissection, vasculitis or significant stenosis. Renals: Not well visualized due to probable motion artifact. IMA: Not well visualized due to possible motion artifact. Inflow: Patent without evidence of aneurysm, dissection, vasculitis or significant stenosis. Veins: No obvious venous abnormality within the limitations of this arterial phase study. Review of the MIP images confirms the above findings. NON-VASCULAR Hepatobiliary: No focal liver abnormality is seen. No gallstones, gallbladder wall thickening, or biliary dilatation. Pancreas: Unremarkable. No pancreatic ductal dilatation or surrounding inflammatory changes. Spleen: Normal in size without focal abnormality. Adrenals/Urinary Tract: Adrenal glands appear normal. Mild right hydroureteronephrosis is noted without obstructing calculus. This appears to be due to irregular mass measuring 4.0 x 2.6 cm involving the right and posterior aspect of the urinary bladder near the right ureterovesical junction. This is concerning for malignancy. Stomach/Bowel: Stomach  is within normal limits. Appendix appears normal. No evidence of bowel wall thickening, distention, or inflammatory changes. Diffuse colonic diverticulosis is noted without inflammation. Lymphatic: Extensive retroperitoneal adenopathy is noted, with largest lymph node measuring 2 cm in left periaortic region. Right common and external iliac adenopathy is noted as well. Largest lymph node measures 2.1 cm in right external iliac region. Left common iliac lymph node is noted measuring 15 mm. 13 mm right inguinal lymph node is noted. Reproductive: Mild prostatic enlargement is noted. It appears to extend into inferior portion of urinary bladder. Other: No abdominal wall hernia or abnormality. No abdominopelvic ascites. Musculoskeletal: Multiple sclerotic densities are noted in the lumbar spine, pelvis and visualized femurs consistent with metastatic disease. Review of the MIP images confirms the above findings. IMPRESSION: Mild mediastinal adenopathy is noted as well as extensive periaortic and right iliac adenopathy is noted most consistent with metastatic disease or possibly lymphoma. Right inguinal adenopathy is noted as well. Please  see above for measurements. Also noted are diffuse sclerotic densities throughout the visualized skeleton consistent with osseous metastases. Mild right hydroureteronephrosis is noted which appears to be due to occlusion from irregular mass involving the posterior portion of the urinary bladder in the region of the right ureterovesical junction. It is uncertain if this mass represents primary bladder cancer, or potentially may represent extension of prostate cancer. Cystoscopy is recommended for further evaluation. No definite evidence of thoracic or abdominal aortic dissection or aneurysm. Aortic Atherosclerosis (ICD10-I70.0). Electronically Signed   By: Lupita Raider M.D.   On: 12/13/2022 12:13   DG Chest Port 1 View  Result Date: 12/13/2022 CLINICAL DATA:  [id number] Chest pain  [id number] EXAM: PORTABLE CHEST 1 VIEW COMPARISON:  11/03/2022 FINDINGS: Left-sided implanted cardiac device, stable. Slight prominence of the cardiac silhouette is likely accentuated by low lung volumes. Aortic atherosclerosis. No focal airspace consolidation, pleural effusion, or pneumothorax. Reverse left shoulder arthroplasty. 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 IMPRESSION: Low lung volumes. No acute cardiopulmonary findings. Electronically Signed   By: Duanne Guess D.O.   On: 12/13/2022 10:45   (Echo, Carotid, EGD, Colonoscopy, ERCP)    Subjective: Patient is resting in bed anxious to go home  Discharge Exam: Vitals:   01/01/23 0545 01/01/23 0600  BP: (!) 151/76 (!) 150/72  Pulse: 75 76  Resp: 20 20  Temp: 99.1 F (37.3 C) 98 F (36.7 C)  SpO2: 94% 98%   Vitals:   12/31/22 1335 12/31/22 2057 01/01/23 0545 01/01/23 0600  BP: 138/65 (!) 151/79 (!) 151/76 (!) 150/72  Pulse: 71 74 75 76  Resp: 17 20 20 20   Temp: 98.2 F (36.8 C) 98.6 F (37 C) 99.1 F (37.3 C) 98 F (36.7 C)  TempSrc: Oral Oral Oral Oral  SpO2: 99% 97% 94% 98%  Weight:      Height:        General: Pt is alert, awake, not in acute distress Cardiovascular: RRR, S1/S2 +, no rubs, no gallops Respiratory: CTA bilaterally, no wheezing, no rhonchi Abdominal: Soft, NT, ND, bowel sounds + Extremities: no edema, no cyanosis    The results of significant diagnostics from this hospitalization (including imaging, microbiology, ancillary and laboratory) are listed below for reference.     Microbiology: No results found for this or any previous visit (from the past 240 hour(s)).   Labs: BNP (last 3 results) Recent Labs    10/28/22 2112 11/02/22 1659 11/03/22 0943  BNP 498.0* 264.0* 386.0*   Basic Metabolic Panel: Recent Labs  Lab 12/30/22 0925 12/30/22 1817 12/31/22 0538  NA 139  --  134*  K 5.4*  --  4.7  CL 103  --  101  CO2 30  --  24  GLUCOSE 119*  --  94  BUN 45*  --   37*  CREATININE 2.01* 2.04* 1.72*  CALCIUM 8.4*  --  8.3*   Liver Function Tests: Recent Labs  Lab 12/30/22 0925  AST 23  ALT 14  ALKPHOS 438*  BILITOT 0.5  PROT 7.2  ALBUMIN 3.7   No results for input(s): "LIPASE", "AMYLASE" in the last 168 hours. No results for input(s): "AMMONIA" in the last 168 hours. CBC: Recent Labs  Lab 12/30/22 0925 12/30/22 1817 12/31/22 0538  WBC 9.6 8.0 8.0  NEUTROABS 5.6  --   --   HGB 8.7* 7.9* 7.4*  HCT 27.5* 25.2* 23.2*  MCV 95.5 95.5 94.7  PLT 299 324 369   Cardiac Enzymes: No  results for input(s): "CKTOTAL", "CKMB", "CKMBINDEX", "TROPONINI" in the last 168 hours. BNP: Invalid input(s): "POCBNP" CBG: No results for input(s): "GLUCAP" in the last 168 hours. D-Dimer No results for input(s): "DDIMER" in the last 72 hours. Hgb A1c No results for input(s): "HGBA1C" in the last 72 hours. Lipid Profile No results for input(s): "CHOL", "HDL", "LDLCALC", "TRIG", "CHOLHDL", "LDLDIRECT" in the last 72 hours. Thyroid function studies No results for input(s): "TSH", "T4TOTAL", "T3FREE", "THYROIDAB" in the last 72 hours.  Invalid input(s): "FREET3" Anemia work up Recent Labs    12/30/22 2125  VITAMINB12 2,233*  FOLATE 16.0  FERRITIN 509*  TIBC 238*  IRON 52   Urinalysis    Component Value Date/Time   COLORURINE YELLOW 12/30/2022 2054   APPEARANCEUR CLEAR 12/30/2022 2054   LABSPEC 1.015 12/30/2022 2054   PHURINE 5.0 12/30/2022 2054   GLUCOSEU NEGATIVE 12/30/2022 2054   HGBUR NEGATIVE 12/30/2022 2054   BILIRUBINUR NEGATIVE 12/30/2022 2054   KETONESUR NEGATIVE 12/30/2022 2054   PROTEINUR NEGATIVE 12/30/2022 2054   UROBILINOGEN 1.0 09/18/2010 1918   NITRITE NEGATIVE 12/30/2022 2054   LEUKOCYTESUR NEGATIVE 12/30/2022 2054   Sepsis Labs Recent Labs  Lab 12/30/22 0925 12/30/22 1817 12/31/22 0538  WBC 9.6 8.0 8.0   Microbiology No results found for this or any previous visit (from the past 240 hour(s)).   Time coordinating  discharge: 39 minutes  SIGNED: Alwyn Ren, MD  [facility] 01/01/2023, 5:16 PM

## 2023-01-01 NOTE — Progress Notes (Signed)
Mobility Specialist - Progress Note   01/01/23 1045  Mobility  Activity Ambulated with assistance in hallway  Level of Assistance Standby assist, set-up cues, supervision of patient - no hands on  Assistive Device Other (Comment);Cane (IV Pole)  Distance Ambulated (ft) 230 ft  Activity Response Tolerated well  Mobility Referral Yes  $Mobility charge 1 Mobility  Mobility Specialist Start Time (ACUTE ONLY) 1039  Mobility Specialist Stop Time (ACUTE ONLY) 1045  Mobility Specialist Time Calculation (min) (ACUTE ONLY) 6 min   Pt received in recliner and agreeable to mobility. C/o knee pain throughout session. No other complaints during session. Pt to recliner after session with all needs met.    [facility]

## 2023-01-01 NOTE — Progress Notes (Signed)
Daily Progress Note   Patient Name: Luis Donovan       Date: 01/01/2023 DOB: 30-Dec-1945  Age: 77 y.o. MRN#: [id number] Attending Physician: Alwyn Ren, MD Primary Care Physician: [facility] Admit Date: 12/30/2022 Length of Stay: 1 day  Reason for Consultation/Follow-up: Establishing goals of care  Subjective:   CC: Patient looking forward to going home today. Following up regarding complex medical decision making.   Subjective:  Reviewed EMR prior to presenting to bedside.  At time of EMR review patient had received oxycodone 10 mg every 4 hours as needed x 3 doses in past 24 hours.  Patient's creatinine improved to 1.72.  Patient noted to have bowel movement today.  Presented to bedside to speak with patient.  Patient sitting up in chair at bedside ready for discharge.  Patient's wife and daughter present at bedside as well.  Patient notes pain improved on oxycodone 10 mg every 4 hours as needed.  Noted this should be continued upon discharge as well and can follow-up with outpatient palliative medicine at [facility] to continue management.  Answered all questions as able at that time.  Noted palliative medicine team available if needed.  Discussed with RN and hospitalist to coordinate care.   Review of Systems Pain improved Objective:   Vital Signs:  BP (!) 150/72 (BP Location: Right Arm)   Pulse 76   Temp 98 F (36.7 C) (Oral)   Resp 20   Ht 5\' 7"  (1.702 m)   Wt 102.8 kg   SpO2 98%   BMI 35.50 kg/m   Physical Exam: General: NAD, alert, laying in bed, pleasant Eyes: No drainage noted HENT: moist mucous membranes Cardiovascular: RRR Respiratory: no increased work of breathing noted, not in respiratory distress Neuro: A&Ox4, following commands easily Psych: appropriately answers all questions  Imaging:  I personally reviewed recent imaging.   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  Patient had been seen at the palliative medicine clinic at [facility] and was recommended to come over for evaluation due to this weakness and noted AKI.  Of note patient had been positive for COVID on 12/18/22 and has had a diminished appetite and poor oral take since.  Palliative medicine team consulted to assist with complex medical decision making.   Recommendations/Plan: # Complex medical decision making/goals of care:   -Patient has heard diagnosis of metastatic cancer though is awaiting answers regarding primary source. Needs biopsy performed.  Patient and family have already stated to providers that patient wants to undergo workup to determine further cancer therapies that would be available.  Supposed to follow-up with urology next week.                -  Code Status: Full Code    # Symptom management                -Pain/Dyspnea, acute in setting of metastatic cancer with unknown primary -Please make sure to discharge with supply to continue oxycodone 10mg  q4hrs prn. Can follow up with outpatient palliative medicine at [facility] to continue this medication management.                   -Constipation                               -  Continue senna to 2 tabs BID                               -Continue Miralax 17gm BID   # Psycho-social/Spiritual Support:  - Support System: wife, daughter  # Discharge Planning:  Home; continued follow up with PMT at [facility]   Thank you for allowing the palliative care team to participate in the care Alveria Apley.  Alvester Morin, DO Palliative Care Provider PMT # [phone]  If patient remains symptomatic despite maximum doses, please call PMT at [phone] between 0700 and 1900. Outside of these hours, please call attending,  as PMT does not have night coverage.  *Please note that this is a verbal dictation therefore any spelling or grammatical errors are due to the "[employer] One" system interpretation.

## 2023-01-06 ENCOUNTER — Ambulatory Visit: Payer: No Typology Code available for payment source | Admitting: Urology

## 2023-01-06 VITALS — BP 168/85 | HR 85

## 2023-01-06 DIAGNOSIS — N3289 Other specified disorders of bladder: Secondary | ICD-10-CM | POA: Diagnosis not present

## 2023-01-06 NOTE — Progress Notes (Signed)
01/06/2023 2:41 PM   Luis Donovan 1945/10/09 [id number]  Referring provider: [facility], [facility] [address] [place],  [place] [address]  Bladder mass   HPI: Luis Donovan is a 76yo here for evaluation of a bladder mass. He was hospitalized with AKI and found to have a bladder mass and metastatic disease with an unknown primary. No gross hematuria .    PMH: Past Medical History:  Diagnosis Date   Anemia    CVA (cerebral vascular accident) (HCC) 07/17/2013   Empyema (HCC)    Erectile dysfunction due to arterial insufficiency    Essential (primary) hypertension    Gastro-esophageal reflux disease with esophagitis    Head injury    Heart disease    Hyperlipidemia, unspecified    Hypertension    Obesity, unspecified    Pneumonia    Post-traumatic stress disorder, unspecified    Sleep apnea, unspecified     Surgical History: Past Surgical History:  Procedure Laterality Date   CARDIAC DEFIBRILLATOR PLACEMENT     LUNG SURGERY     operation on skin of neck     ROTATOR CUFF REPAIR Left    TENDON REPAIR     VIDEO ASSISTED THORACOSCOPY      Home Medications:  Allergies as of 01/06/2023   No Known Allergies      Medication List        Accurate as of January 06, 2023  2:41 PM. If you have any questions, ask your nurse or doctor.          acetaminophen 325 MG tablet Commonly known as: TYLENOL Take 2 tablets (650 mg total) by mouth every 6 (six) hours as needed for mild pain (or Fever >/= 101).   albuterol 108 (90 Base) MCG/ACT inhaler Commonly known as: VENTOLIN HFA Inhale 2 puffs into the lungs every 4 (four) hours as needed for wheezing or shortness of breath.   atorvastatin 80 MG tablet Commonly known as: LIPITOR Take 40 mg by mouth daily.   Carboxymethylcellulose Sod PF 0.5 % Soln Place 1 drop into both eyes 2 (two) times daily. For dry eyes   cetirizine 10 MG tablet Commonly known as: ZYRTEC Take 10 mg by mouth at bedtime.    Cholecalciferol 50 MCG (2000 UT) Tabs Take 1 tablet by mouth every morning.   clotrimazole 1 % cream Commonly known as: LOTRIMIN Apply 1 Application topically 2 (two) times daily. Apply a small amount to affected area twice daily for feet.   cyanocobalamin 500 MCG tablet Commonly known as: VITAMIN B12 Take 1 tablet by mouth daily.   diclofenac Sodium 1 % Gel Commonly known as: VOLTAREN Apply 2 g topically 4 (four) times daily as needed (pain).   Eliquis 5 MG Tabs tablet Generic drug: apixaban Take 5 mg by mouth 2 (two) times daily.   eucerin cream Apply 1 Application topically 2 (two) times daily. Apply a small amount to affected area twice daily   FLUoxetine 20 MG capsule Commonly known as: PROZAC Take 80 mg by mouth daily.   guaiFENesin-dextromethorphan 100-10 MG/5ML syrup Commonly known as: ROBITUSSIN DM Take 5 mLs by mouth every 4 (four) hours as needed for cough.   hydrALAZINE 50 MG tablet Commonly known as: APRESOLINE Take 50 mg by mouth 3 (three) times daily.   KETOTIFEN FUMARATE OP Apply 1 drop to eye 2 (two) times daily as needed (allergies). 0.025%   latanoprost 0.005 % ophthalmic solution Commonly known as: XALATAN Place 1 drop into both eyes  at bedtime.   lidocaine 5 % Commonly known as: LIDODERM Place 1 patch onto the skin daily. Apply 1 patch to skin once daily (Apply for  hours, then remove for  hours)   losartan 100 MG tablet Commonly known as: COZAAR Take 0.5 tablets (50 mg total) by mouth daily.   metoprolol succinate 50 MG 24 hr tablet Commonly known as: TOPROL-XL Take 0.5 tablets by mouth daily.   omeprazole 20 MG capsule Commonly known as: PRILOSEC Take 20 mg by mouth 2 (two) times daily before a meal. Take on an empty stomach 30 minutes prior to a meal   ondansetron 8 MG tablet Commonly known as: ZOFRAN Take 1 tablet (8 mg total) by mouth every 8 (eight) hours as needed for nausea or vomiting.   Oxycodone HCl 10 MG Tabs Take 1  tablet (10 mg total) by mouth every 4 (four) hours as needed for up to 15 days.   polyethylene glycol 17 g packet Commonly known as: MIRALAX / GLYCOLAX Take 17 g by mouth 2 (two) times daily.   senna-docusate 8.6-50 MG tablet Commonly known as: Senokot-S Take 2 tablets by mouth at bedtime.   timolol 0.5 % ophthalmic gel-forming Commonly known as: TIMOPTIC-XR Apply 1 drop to eye daily.   torsemide 20 MG tablet Commonly known as: DEMADEX Take 1 tablet (20 mg total) by mouth 2 (two) times daily. For Fluid   traZODone 50 MG tablet Commonly known as: DESYREL Take 50 mg by mouth at bedtime.        Allergies: No Known Allergies  Family History: No family history on file.  Social History:  reports that he has quit smoking. He has never used smokeless tobacco. He reports that he does not drink alcohol and does not use drugs.  ROS: All other review of systems were reviewed and are negative except what is noted above in HPI  Physical Exam: BP (!) 168/85   Pulse 85   Constitutional:  Alert and oriented, No acute distress. HEENT: [place] AT, moist mucus membranes.  Trachea midline, no masses. Cardiovascular: No clubbing, cyanosis, or edema. Respiratory: Normal respiratory effort, no increased work of breathing. GI: Abdomen is soft, nontender, nondistended, no abdominal masses GU: No CVA tenderness.  Lymph: No cervical or inguinal lymphadenopathy. Skin: No rashes, bruises or suspicious lesions. Neurologic: Grossly intact, no focal deficits, moving all 4 extremities. Psychiatric: Normal mood and affect.  Laboratory Data: Lab Results  Component Value Date   WBC 8.0 12/31/2022   HGB 7.4 (L) 12/31/2022   HCT 23.2 (L) 12/31/2022   MCV 94.7 12/31/2022   PLT 369 12/31/2022    Lab Results  Component Value Date   CREATININE 1.72 (H) 12/31/2022    No results found for: "PSA"  No results found for: "TESTOSTERONE"  Lab Results  Component Value Date   HGBA1C (H) 09/18/2010     6.2 (NOTE)                                                                       According to the ADA Clinical Practice Recommendations for 2011, when HbA1c is used as a screening test:   >=6.5%   Diagnostic of Diabetes Mellitus           (  if abnormal result  is confirmed)  5.7-6.4%   Increased risk of developing Diabetes Mellitus  References:Diagnosis and Classification of Diabetes Mellitus,Diabetes Care,2011,34(Suppl 1):S62-S69 and Standards of Medical Care in         Diabetes - 2011,Diabetes Care,2011,34  (Suppl 1):S11-S61.    Urinalysis    Component Value Date/Time   COLORURINE YELLOW 12/30/2022 2054   APPEARANCEUR CLEAR 12/30/2022 2054   LABSPEC 1.015 12/30/2022 2054   PHURINE 5.0 12/30/2022 2054   GLUCOSEU NEGATIVE 12/30/2022 2054   HGBUR NEGATIVE 12/30/2022 2054   BILIRUBINUR NEGATIVE 12/30/2022 2054   KETONESUR NEGATIVE 12/30/2022 2054   PROTEINUR NEGATIVE 12/30/2022 2054   UROBILINOGEN 1.0 09/18/2010 1918   NITRITE NEGATIVE 12/30/2022 2054   LEUKOCYTESUR NEGATIVE 12/30/2022 2054    Lab Results  Component Value Date   BACTERIA NONE SEEN 12/30/2022    Pertinent Imaging: Ct 12/13/2022: Images reviewed and discussed with the patient  No results found for this or any previous visit.  No results found for this or any previous visit.  No results found for this or any previous visit.  No results found for this or any previous visit.  Results for orders placed during the hospital encounter of 09/12/10  US Renal  Narrative *RADIOLOGY REPORT*  Clinical Data: Renal failure  RENAL/URINARY TRACT ULTRASOUND COMPLETE  Comparison:  Ultrasound of the abdomen of 01/22/2004  Findings:  Right Kidney:  The right kidney measures 8.6 cm sagittally.  No hydronephrosis is seen.  A discrepancy in length of greater than 2 cm suggests an element of atrophy.  Left Kidney:  No hydronephrosis and the left kidney measures 11.0 cm.  Bladder:  The urinary bladder is  unremarkable.  IMPRESSION: No hydronephrosis.  There is discrepancy in renal size with the right kidney smaller than the left as noted above.  Original Report Authenticated By: Juline Patch, M.D.  No valid procedures specified. No results found for this or any previous visit.  No results found for this or any previous visit.   Assessment & Plan:    Bladder mass -We dsicussed the management of bladder masses including observation versus endoscopic resection and after discussing the options the patient elects for resection  No follow-ups on file.  Wilkie Aye, MD  [facility] [place]    01/06/23  CC: No chief complaint on file.   HPI:  Blood pressure (!) 168/85, pulse 85. NED. A&Ox3.   No respiratory distress   Abd soft, NT, ND Normal phallus with bilateral descended testicles  Cystoscopy Procedure Note  Patient identification was confirmed, informed consent was obtained, and patient was prepped using Betadine solution.  Lidocaine jelly was administered per urethral meatus.     Pre-Procedure: - Inspection reveals a normal caliber ureteral meatus.  Procedure: The flexible cystoscope was introduced without difficulty - No urethral strictures/lesions are present. - Enlarged prostate  - Normal bladder neck - Bilateral ureteral orifices identified - Bladder mucosa  reveals 4cm bladder neck lesion - No bladder stones - No trabeculation   Post-Procedure: - Patient tolerated the procedure well  Assessment/ Plan:   No follow-ups on file.  Wilkie Aye, MD

## 2023-01-13 ENCOUNTER — Encounter: Payer: Self-pay | Admitting: Urology

## 2023-01-13 ENCOUNTER — Inpatient Hospital Stay (HOSPITAL_BASED_OUTPATIENT_CLINIC_OR_DEPARTMENT_OTHER): Payer: Medicare PPO | Admitting: Hematology and Oncology

## 2023-01-13 ENCOUNTER — Encounter: Payer: Self-pay | Admitting: Nurse Practitioner

## 2023-01-13 ENCOUNTER — Inpatient Hospital Stay: Payer: Medicare PPO

## 2023-01-13 ENCOUNTER — Inpatient Hospital Stay (HOSPITAL_BASED_OUTPATIENT_CLINIC_OR_DEPARTMENT_OTHER): Payer: Medicare PPO | Admitting: Nurse Practitioner

## 2023-01-13 ENCOUNTER — Other Ambulatory Visit: Payer: Self-pay | Admitting: *Deleted

## 2023-01-13 VITALS — BP 139/63 | HR 88 | Temp 98.3°F | Resp 18 | Ht 67.0 in | Wt 228.9 lb

## 2023-01-13 DIAGNOSIS — K5903 Drug induced constipation: Secondary | ICD-10-CM

## 2023-01-13 DIAGNOSIS — Z515 Encounter for palliative care: Secondary | ICD-10-CM

## 2023-01-13 DIAGNOSIS — C799 Secondary malignant neoplasm of unspecified site: Secondary | ICD-10-CM

## 2023-01-13 DIAGNOSIS — R530 Neoplastic (malignant) related fatigue: Secondary | ICD-10-CM

## 2023-01-13 DIAGNOSIS — C7951 Secondary malignant neoplasm of bone: Secondary | ICD-10-CM | POA: Diagnosis not present

## 2023-01-13 DIAGNOSIS — C801 Malignant (primary) neoplasm, unspecified: Secondary | ICD-10-CM

## 2023-01-13 DIAGNOSIS — G893 Neoplasm related pain (acute) (chronic): Secondary | ICD-10-CM | POA: Diagnosis not present

## 2023-01-13 LAB — CMP (CANCER CENTER ONLY)
ALT: 34 U/L (ref 0–44)
AST: 40 U/L (ref 15–41)
Albumin: 3.5 g/dL (ref 3.5–5.0)
Alkaline Phosphatase: 552 U/L — ABNORMAL HIGH (ref 38–126)
Anion gap: 6 (ref 5–15)
BUN: 35 mg/dL — ABNORMAL HIGH (ref 8–23)
CO2: 31 mmol/L (ref 22–32)
Calcium: 9 mg/dL (ref 8.9–10.3)
Chloride: 94 mmol/L — ABNORMAL LOW (ref 98–111)
Creatinine: 1.4 mg/dL — ABNORMAL HIGH (ref 0.61–1.24)
GFR, Estimated: 53 mL/min — ABNORMAL LOW (ref >60)
Glucose, Bld: 141 mg/dL — ABNORMAL HIGH (ref 70–99)
Potassium: 4.4 mmol/L (ref 3.5–5.1)
Sodium: 131 mmol/L — ABNORMAL LOW (ref 135–145)
Total Bilirubin: 0.4 mg/dL (ref 0.3–1.2)
Total Protein: 6.9 g/dL (ref 6.5–8.1)

## 2023-01-13 LAB — CBC WITH DIFFERENTIAL (CANCER CENTER ONLY)
Abs Immature Granulocytes: 0.43 K/uL — ABNORMAL HIGH (ref 0.00–0.07)
Basophils Absolute: 0.1 K/uL (ref 0.0–0.1)
Basophils Relative: 1 %
Eosinophils Absolute: 0.2 K/uL (ref 0.0–0.5)
Eosinophils Relative: 3 %
HCT: 22.2 % — ABNORMAL LOW (ref 39.0–52.0)
Hemoglobin: 7.3 g/dL — ABNORMAL LOW (ref 13.0–17.0)
Immature Granulocytes: 6 %
Lymphocytes Relative: 20 %
Lymphs Abs: 1.4 K/uL (ref 0.7–4.0)
MCH: 30.8 pg (ref 26.0–34.0)
MCHC: 32.9 g/dL (ref 30.0–36.0)
MCV: 93.7 fL (ref 80.0–100.0)
Monocytes Absolute: 0.6 K/uL (ref 0.1–1.0)
Monocytes Relative: 8 %
Neutro Abs: 4.5 K/uL (ref 1.7–7.7)
Neutrophils Relative %: 62 %
Platelet Count: 296 K/uL (ref 150–400)
RBC: 2.37 MIL/uL — ABNORMAL LOW (ref 4.22–5.81)
RDW: 16.5 % — ABNORMAL HIGH (ref 11.5–15.5)
Smear Review: NORMAL
WBC Count: 7.3 K/uL (ref 4.0–10.5)
nRBC: 0.4 % — ABNORMAL HIGH (ref 0.0–0.2)

## 2023-01-13 LAB — SAMPLE TO BLOOD BANK

## 2023-01-13 LAB — RETIC PANEL
Immature Retic Fract: 35.5 % — ABNORMAL HIGH (ref 2.3–15.9)
RBC.: 2.49 MIL/uL — ABNORMAL LOW (ref 4.22–5.81)
Retic Count, Absolute: 64.5 10*3/uL (ref 19.0–186.0)
Retic Ct Pct: 2.6 % (ref 0.4–3.1)
Reticulocyte Hemoglobin: 28.9 pg (ref >27.9)

## 2023-01-13 LAB — C-REACTIVE PROTEIN: CRP: 11.1 mg/dL — ABNORMAL HIGH (ref <1.0)

## 2023-01-13 LAB — LACTATE DEHYDROGENASE: LDH: 301 U/L — ABNORMAL HIGH (ref 98–192)

## 2023-01-13 LAB — SEDIMENTATION RATE: Sed Rate: 143 mm/h — ABNORMAL HIGH (ref 0–16)

## 2023-01-13 LAB — ABO/RH: ABO/RH(D): A POS

## 2023-01-13 MED ORDER — CIPROFLOXACIN HCL 500 MG PO TABS
500.0000 mg | ORAL_TABLET | Freq: Once | ORAL | Status: DC
Start: 2023-01-13 — End: 2023-01-27

## 2023-01-13 MED ORDER — XTAMPZA ER 9 MG PO C12A: 9.0000 mg | EXTENDED_RELEASE_CAPSULE | Freq: Two times a day (BID) | ORAL | 0 refills | Status: DC

## 2023-01-13 MED ORDER — POLYETHYLENE GLYCOL 3350 17 G PO PACK
17.0000 g | PACK | Freq: Two times a day (BID) | ORAL | 0 refills | Status: AC
Start: 2023-01-13 — End: ?

## 2023-01-13 MED ORDER — CYCLOBENZAPRINE HCL 5 MG PO TABS: 5.0000 mg | ORAL_TABLET | Freq: Three times a day (TID) | ORAL | 0 refills | Status: DC | PRN

## 2023-01-13 MED ORDER — OXYCODONE HCL 10 MG PO TABS
10.0000 mg | ORAL_TABLET | ORAL | 0 refills | Status: AC | PRN
Start: 2023-01-13 — End: 2023-01-28

## 2023-01-13 NOTE — Progress Notes (Signed)
Palliative Medicine [facility]  [phone] [phone]   Name: Luis Donovan Date: 01/13/2023 MRN: [id number]  DOB: [birth year]  Patient Care Team: [facility], [facility] as PCP - General Pickenpack-Cousar, Arty Baumgartner, NP as Nurse Practitioner (Hospice and Palliative Medicine)    INTERVAL HISTORY: Luis Donovan is a 77 y.o. 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  Palliative ask to see for symptom management and goals of care.   SOCIAL HISTORY:     reports that he has quit smoking. He has never used smokeless tobacco. He reports that he does not drink alcohol and does not use drugs.  ADVANCE DIRECTIVES:  None on file  CODE STATUS: Full code  PAST MEDICAL HISTORY: Past Medical History:  Diagnosis Date   Anemia    CVA (cerebral vascular accident) (HCC) 07/17/2013   Empyema (HCC)    Erectile dysfunction due to arterial insufficiency    Essential (primary) hypertension    Gastro-esophageal reflux disease with esophagitis    Head injury    Heart disease    Hyperlipidemia, unspecified    Hypertension    Obesity, unspecified    Pneumonia    Post-traumatic stress disorder, unspecified    Sleep apnea, unspecified     ALLERGIES:  has No Known Allergies.  MEDICATIONS:  Current Outpatient Medications  Medication Sig Dispense Refill   acetaminophen (TYLENOL) 325 MG tablet Take 2 tablets (650 mg total) by mouth every 6 (six) hours as needed for mild pain (or Fever >/= 101).     albuterol (VENTOLIN HFA) 108 (90 Base) MCG/ACT inhaler Inhale 2 puffs into the lungs every 4 (four) hours as needed for wheezing or shortness of breath.      apixaban (ELIQUIS) 5 MG TABS tablet Take 5 mg by mouth 2 (two) times daily.     atorvastatin (LIPITOR) 80 MG tablet Take 40 mg by mouth daily.     Carboxymethylcellulose Sod PF 0.5 % SOLN Place 1 drop into both eyes 2 (two) times daily. For dry eyes     cetirizine (ZYRTEC) 10 MG tablet Take 10 mg by mouth at bedtime.     Cholecalciferol 50 MCG (2000 UT) TABS Take 1 tablet by mouth every morning.     clotrimazole (LOTRIMIN) 1 % cream Apply 1 Application topically 2 (two) times daily. Apply a small amount to affected area twice daily for feet.     cyanocobalamin (VITAMIN B12) 500 MCG tablet Take 1 tablet by mouth daily.     diclofenac Sodium (VOLTAREN) 1 % GEL Apply 2 g topically 4 (four) times daily as needed (pain).     FLUoxetine (PROZAC) 20 MG capsule Take 80 mg by mouth daily.     guaiFENesin-dextromethorphan (ROBITUSSIN DM) 100-10 MG/5ML syrup Take 5 mLs by mouth every 4 (four) hours as needed for cough. 118 mL 0   hydrALAZINE (APRESOLINE) 50 MG tablet Take 50 mg by mouth 3 (three) times daily.     KETOTIFEN FUMARATE OP Apply 1 drop to eye 2 (two) times daily as needed (allergies). 0.025%     latanoprost (XALATAN) 0.005 % ophthalmic solution Place 1 drop into both eyes at bedtime.     lidocaine (LIDODERM) 5 % Place 1 patch onto the  skin daily. Apply 1 patch to skin once daily (Apply for  hours, then remove for  hours)     losartan (COZAAR) 100 MG tablet Take 0.5 tablets (50 mg total) by mouth daily. 30 tablet 2   metoprolol succinate (TOPROL-XL) 50 MG 24 hr tablet Take 0.5 tablets by mouth daily.     omeprazole (PRILOSEC) 20 MG capsule Take 20 mg by mouth 2 (two) times daily before a meal. Take on an empty stomach 30 minutes prior to a meal     ondansetron (ZOFRAN) 8 MG tablet Take 1 tablet (8 mg total) by mouth every 8 (eight) hours as needed for nausea or vomiting. 30 tablet 2   Oxycodone HCl 10 MG TABS Take 1 tablet (10 mg total) by mouth every 4 (four) hours as needed for up to 15 days. 30  tablet 0   polyethylene glycol (MIRALAX / GLYCOLAX) 17 g packet Take 17 g by mouth 2 (two) times daily. 14 each 0   senna-docusate (SENOKOT-S) 8.6-50 MG tablet Take 2 tablets by mouth at bedtime. 60 tablet 0   Skin Protectants, Misc. (EUCERIN) cream Apply 1 Application topically 2 (two) times daily. Apply a small amount to affected area twice daily     timolol (TIMOPTIC-XR) 0.5 % ophthalmic gel-forming Apply 1 drop to eye daily.     torsemide (DEMADEX) 20 MG tablet Take 1 tablet (20 mg total) by mouth 2 (two) times daily. For Fluid 60 tablet 0   traZODone (DESYREL) 50 MG tablet Take 50 mg by mouth at bedtime.     No current facility-administered medications for this visit.    VITAL SIGNS: There were no vitals taken for this visit. There were no vitals filed for this visit.  Estimated body mass index is 35.5 kg/m as calculated from the following:   Height as of 12/30/22: 5\' 7"  (1.702 m).   Weight as of 12/30/22: 226 lb 10.1 oz (102.8 kg).   PERFORMANCE STATUS (ECOG) : 2 - Symptomatic, <50% confined to bed   Physical Exam General: Weak appearing Cardiovascular: regular rate and rhythm Pulmonary: clear ant fields Abdomen: soft, nontender, + bowel sounds Extremities: bilateral lower extremity edema, no joint deformities Skin: no rashes Neurological: AAO x4  IMPRESSION:  Luis Donovan presents to clinic for follow-up. Continues to endorse weakness, fatigue, and pain. Shares he did have recent visit with Urologist with pending biopsy to be scheduled. His wife is present and daughter is on speak phone during appointment. They have many questions in regards to his questionable cancer, how poor patient's quality of life hs become, and expectations. I answer all questions to the best of my ability and offer support. Luis Donovan is unable to do much around the home due to his significant fatigue. He has not energy and is now requiring assistance with ADLs. He has not yet been established with an  Oncologist for further oncology work-up.   Neoplasm related pain Luis Donovan is emotional expressing his ongoing pain. He reports his pain has now worsened and also increased in other areas. His pain is now not only in his back but also in his right hip, and lower extremities. He has to use his pants or assistance to lift his left leg due to amount of pain and weakness. While sitting in chair he is observed intermittently grimacing and jerking sporadically due to sharp pains and spasms. He describes pain as sharp, stabbing, throbbing, radiating and constant.  He is currently taking oxycodone 10 mg every 4  hours as needed.  Is requiring every 4 hours around-the-clock.  States if he does not take the medication his pain is a 10 out of 10 or off the charts.  With medication his pain decreases to 6-7 out of 10.  Education provided on use of long-acting pain medication in addition to his breakthrough medication.  Education provided on use of medication, efficacy, potential side effects, safety in conjunction with other sedating medications, as well as bowel regimen in the setting of opioid use.  He and family verbalized understanding.  I also discussed use of muscle relaxers as needed.   They verbalized understanding and appreciation.  We will continue to closely monitor and adjust medications as needed.     Constipation Education provided on the importance of daily bowel regimen in setting of opioid use.  Recommended daily use of MiraLAX and senna S.   Goals of Care  Luis Donovan and his family are emotional in regards to his overall quality of life over the past several months. He has went from and independent active gentlemen to a sedentary lifestyle with constant daily pain.  They are remaining hopeful for answers and being connected with the proper medical team to allow this to happen.  I have spoken with Dr. Leonides Schanz (Oncologist) and discussed patient's case with him.  He has graciously agreed to see  patient today and begin oncology workup and planning.  Patient and family aware and grateful.  At this time they are clear and expressed goals to treat the treatable allowing patient every opportunity to continue to thrive while awaiting definitive answers to allow for the ability to make informed decisions regarding treatment options etc.  12/30/22- We discussed his current illness and what it means in the larger context of his on-going co-morbidities. Natural disease trajectory and expectations were discussed.   Luis Donovan and his family are realistic in their understanding of most likely having metastatic cancer.  They are anxiously awaiting further workup and seeking definitive answers appropriately so allowing them to make better decisions regarding next steps.  They are remaining hopeful for interventions allowing some improvement in Luis Donovan his quality of life.  Wife speaks to patient's good quality of life 3 months prior.  Emotional support provided.   I discussed the importance of continued conversation with family and their medical providers regarding overall plan of care and treatment options, ensuring decisions are within the context of the patients values and GOCs.  I discussed the importance of continued conversation with family and their medical providers regarding overall plan of care and treatment options, ensuring decisions are within the context of the patients values and GOCs.  PLAN:  Goals of care discussions.  Patient and family anxiously awaiting further workup to allow for definitive answers in the setting of suspected metastatic cancer.  At this time they are clear and expressed wishes to continue to treat the treatable aggressively allow her every opportunity for Luis Donovan to continue to thrive.  Patient to be seen by Dr. Leonides Schanz later today to begin oncology workup. Xtampza 9 mg every 12 hours Oxycodone 10 mg every 4 hours as needed for breakthrough pain Flexeril 5 mg  every 8 hours as needed for muscle spasms Tylenol every 8 hours as needed MiraLAX twice daily Ongoing symptom management and goals of care discussions Will obtain CBC/CMP today  I will plan to see patient back in 2-3 weeks in collaboration to other oncology appointments.    Patient expressed understanding and was in  agreement with this plan. He also understands that He can call the clinic at any time with any questions, concerns, or complaints.   Any controlled substances utilized were prescribed in the context of palliative care. PDMP has been reviewed.    Visit consisted of counseling and education dealing with the complex and emotionally intense issues of symptom management and palliative care in the setting of serious and potentially life-threatening illness.Greater than 50%  of this time was spent counseling and coordinating care related to the above assessment and plan.  Willette Alma, AGPCNP-BC  Palliative Medicine Team/[facility]  *Please note that this is a verbal dictation therefore any spelling or grammatical errors are due to the "[employer] One" system interpretation.

## 2023-01-13 NOTE — Progress Notes (Signed)
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 Scan showed mediastinal adenopathy is noted as well as extensive periaortic and right iliac adenopathy is noted most consistent with metastatic disease or possibly lymphoma. Also noted are diffuse sclerotic densities throughout the visualized skeleton consistent with osseous metastases.Mild right hydroureteronephrosis is noted which appears to be due to occlusion from irregular mass involving the posterior portion of the urinary bladder in the region of the right ureterovesical junction. 01/13/2023: establish care with Dr. 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  On review of the previous records Luis Donovan underwent a CT angio chest abdomen pelvis on 12/13/2022.  This was performed due to chest and back pain.  His findings showed mediastinal adenopathy and extensive periaortic and right iliac adenopathy as well.  There were also noted to be diffuse sclerotic lesions throughout his skeleton.  The patient was to connect with oncology but this has not yet happened.  He is currently awaiting the call from urology to biopsy of the lesion noted in the bladder.  At this time there is concern for bladder versus prostate primary.  The patient currently follows with our  palliative care department who requested that we see the patient today.  On exam today Luis Donovan is accompanied by his wife.  He reports that approximately 3 months ago he began developing weakness and felt like he was "gathering fluid".  He did have a COVID infection which made things worse.  He reports he is having a lot of pain predominantly in his spine and hips.  He notes that his relatively well-managed with his current pain regimen through palliative care.  He notes he is in touch with [facility] who is currently planning on performing a biopsy in the near future, however this has not yet been scheduled.  He notes he did undergo a cystoscopy which revealed a bladder mass but it was unclear if this was arising from the bladder or invading from the prostate.  On further discussion the patient reports he has a strong family history of cancer with his mother having lung cancer, father lung cancer, and his brother having some form of cancer as well.  He reports he has 4 children 3 of whom are living.  He notes that he is a former smoker having quit in 1991.  He smoked from [id number] and would generally smoke 1 pack/day.  He notes he also drink alcohol but quit in 1990 he reports that he quit substances at the time because he was admitted to jail for crack cocaine use.  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  He otherwise denies any fevers, chills, sweats, nausea, vomiting or diarrhea.  He denies any blood in his urine or pain with urination.  A full 10 point ROS is otherwise negative.  MEDICAL HISTORY:  Past Medical History:  Diagnosis Date   Anemia    CVA (cerebral vascular accident) (HCC) 07/17/2013   Empyema (HCC)    Erectile dysfunction due to arterial insufficiency    Essential (primary) hypertension    Gastro-esophageal reflux disease with esophagitis    Head injury    Heart disease     Hyperlipidemia, unspecified    Hypertension    Obesity, unspecified    Pneumonia    Post-traumatic stress disorder, unspecified    Sleep apnea, unspecified     SURGICAL HISTORY: Past Surgical History:  Procedure Laterality Date   CARDIAC DEFIBRILLATOR PLACEMENT     LUNG SURGERY     operation on skin of neck     ROTATOR CUFF REPAIR Left    TENDON REPAIR     VIDEO ASSISTED THORACOSCOPY      SOCIAL HISTORY: Social History   Socioeconomic History   Marital status: Married    Spouse name: beverly   Number of children: 4   Years of education: Not on file   Highest education level: Not on file  Occupational History   Occupation: retired    Associate Professor: RETIRED  Tobacco Use   Smoking status: Former   Smokeless tobacco: Never  Advertising account planner   Vaping status: Never Used  Substance and Sexual Activity   Alcohol use: No   Drug use: No   Sexual activity: Never    Birth control/protection: None  Other Topics Concern   Not on file  Social History Narrative   Not on file   Social Determinants of Health   Financial Resource Strain: Not on file  Food Insecurity: No Food Insecurity (12/30/2022)   Hunger Vital Sign    Worried About Running Out of Food in the Last Year: Never true    Ran Out of Food in the Last Year: Never true  Transportation Needs: No Transportation Needs (12/30/2022)   PRAPARE - Administrator, Civil Service (Medical): No    Lack of Transportation (Non-Medical): No  Physical Activity: Not on file  Stress: Not on file  Social Connections: Not on file  Intimate Partner Violence: Not At Risk (12/30/2022)   Humiliation, Afraid, Rape, and Kick questionnaire    Fear of Current or Ex-Partner: No    Emotionally Abused: No    Physically Abused: No    Sexually Abused: No    FAMILY HISTORY: No family history on file.  ALLERGIES:  has No Known Allergies.  MEDICATIONS:  Current Outpatient Medications  Medication Sig Dispense Refill   acetaminophen  (TYLENOL) 325 MG tablet Take 2 tablets (650 mg total) by mouth every 6 (six) hours as needed for mild pain (or Fever >/= 101).     albuterol (VENTOLIN HFA) 108 (90 Base) MCG/ACT inhaler Inhale 2 puffs into the lungs every 4 (four) hours as needed for wheezing or shortness of breath.     apixaban (ELIQUIS) 5 MG TABS tablet Take 5 mg by mouth 2 (two) times daily.     atorvastatin (LIPITOR) 80 MG tablet Take 40 mg by mouth daily.     Carboxymethylcellulose Sod PF 0.5 % SOLN Place 1 drop into both eyes 2 (two) times daily. For dry eyes     cetirizine (ZYRTEC) 10 MG tablet Take 10 mg by mouth at bedtime.     Cholecalciferol 50 MCG (2000 UT) TABS Take 1 tablet by mouth every morning.     clotrimazole (LOTRIMIN) 1 % cream Apply 1 Application topically 2 (two) times daily.  Apply a small amount to affected area twice daily for feet.     cyanocobalamin (VITAMIN B12) 500 MCG tablet Take 1 tablet by mouth daily.     cyclobenzaprine (FLEXERIL) 5 MG tablet Take 1 tablet (5 mg total) by mouth 3 (three) times daily as needed for muscle spasms. 30 tablet 0   diclofenac Sodium (VOLTAREN) 1 % GEL Apply 2 g topically 4 (four) times daily as needed (pain).     FLUoxetine (PROZAC) 20 MG capsule Take 80 mg by mouth daily.     guaiFENesin-dextromethorphan (ROBITUSSIN DM) 100-10 MG/5ML syrup Take 5 mLs by mouth every 4 (four) hours as needed for cough. 118 mL 0   hydrALAZINE (APRESOLINE) 50 MG tablet Take 50 mg by mouth 3 (three) times daily.     KETOTIFEN FUMARATE OP Apply 1 drop to eye 2 (two) times daily as needed (allergies). 0.025%     latanoprost (XALATAN) 0.005 % ophthalmic solution Place 1 drop into both eyes at bedtime.     lidocaine (LIDODERM) 5 % Place 1 patch onto the skin daily. Apply 1 patch to skin once daily (Apply for  hours, then remove for  hours)     losartan (COZAAR) 100 MG tablet Take 0.5 tablets (50 mg total) by mouth daily. 30 tablet 2   metoprolol succinate (TOPROL-XL) 50 MG 24 hr tablet Take 0.5  tablets by mouth daily.     omeprazole (PRILOSEC) 20 MG capsule Take 20 mg by mouth 2 (two) times daily before a meal. Take on an empty stomach 30 minutes prior to a meal     ondansetron (ZOFRAN) 8 MG tablet Take 1 tablet (8 mg total) by mouth every 8 (eight) hours as needed for nausea or vomiting. 30 tablet 2   oxyCODONE ER (XTAMPZA ER) 9 MG C12A Take 9 mg by mouth every 12 (twelve) hours. 30 capsule 0   Oxycodone HCl 10 MG TABS Take 1 tablet (10 mg total) by mouth every 4 (four) hours as needed for up to 15 days. 90 tablet 0   polyethylene glycol (MIRALAX / GLYCOLAX) 17 g packet Take 17 g by mouth 2 (two) times daily. 14 each 0   Skin Protectants, Misc. (EUCERIN) cream Apply 1 Application topically 2 (two) times daily. Apply a small amount to affected area twice daily     timolol (TIMOPTIC-XR) 0.5 % ophthalmic gel-forming Apply 1 drop to eye daily.     torsemide (DEMADEX) 20 MG tablet Take 1 tablet (20 mg total) by mouth 2 (two) times daily. For Fluid 60 tablet 0   traZODone (DESYREL) 50 MG tablet Take 50 mg by mouth at bedtime.     Current Facility-Administered Medications  Medication Dose Route Frequency Provider Last Rate Last Admin   ciprofloxacin (CIPRO) tablet 500 mg  500 mg Oral Once         REVIEW OF SYSTEMS:   Constitutional: ( - ) fevers, ( - )  chills , ( - ) night sweats Eyes: ( - ) blurriness of vision, ( - ) double vision, ( - ) watery eyes Ears, nose, mouth, throat, and face: ( - ) mucositis, ( - ) sore throat Respiratory: ( - ) cough, ( - ) dyspnea, ( - ) wheezes Cardiovascular: ( - ) palpitation, ( - ) chest discomfort, ( - ) lower extremity swelling Gastrointestinal:  ( - ) nausea, ( - ) heartburn, ( - ) change in bowel habits Skin: ( - ) abnormal skin rashes Lymphatics: ( - )  new lymphadenopathy, ( - ) easy bruising Neurological: ( - ) numbness, ( - ) tingling, ( - ) new weaknesses Behavioral/Psych: ( - ) mood change, ( - ) new changes  All other systems were reviewed  with the patient and are negative.  PHYSICAL EXAMINATION:  There were no vitals filed for this visit. There were no vitals filed for this visit.  GENERAL: well appearing elderly African-American male in NAD  SKIN: skin color, texture, turgor are normal, no rashes or significant lesions EYES: conjunctiva are pink and non-injected, sclera clear LUNGS: clear to auscultation and percussion with normal breathing effort HEART: regular rate & rhythm and no murmurs and no lower extremity edema Musculoskeletal: no cyanosis of digits and no clubbing  PSYCH: alert & oriented x 3, fluent speech NEURO: no focal motor/sensory deficits  LABORATORY DATA:  I have reviewed the data as listed    Latest Ref Rng & Units 01/13/2023   10:39 AM 12/31/2022    5:38 AM 12/30/2022    6:17 PM  CBC  WBC 4.0 - 10.5 K/uL 7.3  8.0  8.0   Hemoglobin 13.0 - 17.0 g/dL 7.3  7.4  7.9   Hematocrit 39.0 - 52.0 % 22.2  23.2  25.2   Platelets 150 - 400 K/uL 296  369  324        Latest Ref Rng & Units 01/13/2023   10:39 AM 12/31/2022    5:38 AM 12/30/2022    6:17 PM  CMP  Glucose 70 - 99 mg/dL 147  94    BUN 8 - 23 mg/dL 35  37    Creatinine 8.29 - 1.24 mg/dL 5.62  1.30  8.65   Sodium 135 - 145 mmol/L 131  134    Potassium 3.5 - 5.1 mmol/L 4.4  4.7    Chloride 98 - 111 mmol/L 94  101    CO2 22 - 32 mmol/L 31  24    Calcium 8.9 - 10.3 mg/dL 9.0  8.3    Total Protein 6.5 - 8.1 g/dL 6.9     Total Bilirubin 0.3 - 1.2 mg/dL 0.4     Alkaline Phos 38 - 126 U/L 552     AST 15 - 41 U/L 40     ALT 0 - 44 U/L 34        ASSESSMENT & PLAN Luis Donovan 77 y.o. 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  He was to be connected with oncology that time but and has been delayed.  He established care today at the request of our palliative care service. -- Patient is connected with urology who will be performing a biopsy of the bladder mass in order to determine the etiology of his metastatic disease. -- Today we will order baseline labs to include CBC, CMP, LDH, and PSA. -- Return to clinic pending the results of his biopsy.  # Severe Normocytic Anemia -- Patient noted to have a hemoglobin of 7.4 on 12/31/2022 with normal MCV. -- Prior nutritional studies show no  evidence of a nutritional deficiency. -- Will order ESR and CRP to rule out inflammatory etiology as well as blood type for possible transfusion. --Strongly suspect marrow suppression from his malignancy. -- Will continue to monitor.    Orders Placed This Encounter  Procedures   Lactate dehydrogenase (LDH)    Standing Status:   Future    Number of Occurrences:   1    Standing Expiration Date:   01/13/2024   Retic Panel    Standing Status:   Future    Number of Occurrences:   1    Standing Expiration Date:   01/13/2024   Prostate-Specific AG, Serum    Standing Status:   Future    Number of Occurrences:   1    Standing Expiration Date:   01/13/2024   Sedimentation rate    Standing Status:   Future    Number of Occurrences:   1    Standing Expiration Date:   01/13/2024   C-reactive protein    Standing Status:   Future    Number of Occurrences:   1    Standing Expiration Date:   01/13/2024    All questions were answered. The patient knows to call the clinic with any problems, questions or concerns.  A total of more than 60 minutes were spent on this encounter with face-to-face time and non-face-to-face time, including  preparing to see the patient, ordering tests and/or medications, counseling the patient and coordination of care as outlined above.   Ulysees Barns, MD Department of Hematology/Oncology [facility] Phone: [phone] Pager: [phone] Email: [email or web address]  01/15/2023 9:08 AM

## 2023-01-13 NOTE — Patient Instructions (Addendum)
-   take oxycodone and muscle relaxer at least an hour apart to help with drowsiness - continue with oxycodone immediate release every 4 hours as needed for pain (also called "breakthrough pain") - START taking Xtampza (also called oxycodone extended release) long acting pain medication in addition to your oxycodone immediate release and take every 12 hours regardless of pain level (8am and 8pm, OR 9am, and 9pm, etc). Take your first dose tonight  - continue with your mirilax twice a day to prevent constipation, if needed add SennaKot at bedtime - continue your omeprazole (Prilosec) before meals for indigestion  - take ondansetron (Zofran) every 6-8 hours as needed for nausea  - STOP taking the B-12 for 5 days then restart  - call with office with any questions or concerns at [phone], call 911 with any emergency

## 2023-01-13 NOTE — Patient Instructions (Signed)
Transurethral Resection of Bladder Tumor  Transurethral resection of a bladder tumor is the removal (resection) of cancerous tissue (tumor) from the inside wall of the bladder. The bladder is the organ that holds urine. The tumor is removed through the tube that carries urine out of the body (urethra). In a transurethral resection, a thin telescope with a light, a tiny camera, and an electric cutting edge (resectoscope) is passed through the urethra. In men, the opening of the urethra is at the end of the penis. In women, it is just above the opening of the vagina. Tell a health care provider about: Any allergies you have. All medicines you are taking, including vitamins, herbs, eye drops, creams, and over-the-counter medicines. Any problems you or family members have had with anesthetic medicines. Any bleeding problems you have. Any surgeries you have had. Any medical conditions you have, including recent urinary tract infections. Whether you are pregnant or may be pregnant. What are the risks? Generally, this is a safe procedure. However, problems may occur, including: Infection. Bleeding. Allergic reactions to medicines. Damage to nearby structures or organs. Difficulty urinating from blockage of the urethra or not being able to urinate (urinary retention). Deep vein thrombosis. This is a blood clot that can develop in your leg. Recurring cancer. What happens before the procedure? When to stop eating and drinking Follow instructions from your health care provider about what you may eat and drink before your procedure. These may include: 8 hours before your procedure Stop eating most foods. Do not eat meat, fried foods, or fatty foods. Eat only light foods, such as toast or crackers. All liquids are okay except energy drinks and alcohol. 6 hours before your procedure Stop eating. Drink only clear liquids, such as water, clear fruit juice, black coffee, plain tea, and sports  drinks. Do not drink energy drinks or alcohol. 2 hours before your procedure Stop drinking all liquids. You may be allowed to take medicines with small sips of water. Medicines Ask your health care provider about: Changing or stopping your regular medicines. This is especially important if you are taking diabetes medicines or blood thinners. Taking medicines such as aspirin and ibuprofen. These medicines can thin your blood. Do not take these medicines unless your health care provider tells you to take them. Taking over-the-counter medicines, vitamins, herbs, and supplements. General instructions If you will be going home right after the procedure, plan to have a responsible adult: Take you home from the hospital or clinic. You will not be allowed to drive. Care for you for the time you are told. Ask your health care provider what steps will be taken to help prevent infection. These steps may include: Washing skin with a germ-killing soap. Taking antibiotic medicine. Do not use any products that contain nicotine or tobacco for at least 4 weeks before the procedure. These products include cigarettes, chewing tobacco, and vaping devices, such as e-cigarettes. If you need help quitting, ask your health care provider. What happens during the procedure? An IV will be inserted into one of your veins. You will be given one or more of the following: A medicine to help you relax (sedative). A medicine that is injected into your spine to numb the area below and slightly above the injection site (spinal anesthetic). A medicine that is injected into an area of your body to numb everything below the injection site (regional anesthetic). A medicine to make you fall asleep (general anesthetic). Your legs will be placed  in foot rests (stirrups) to open your legs and bend your knees. The resectoscope will be passed through your urethra and into your bladder. The part of your bladder with the tumor will be  resected by the cutting edge of the resectoscope. Fluid will be passed to rinse out the cut tissues (irrigation). The resectoscope will then be taken out. A small, thin tube (catheter) will be passed through your urethra and into your bladder. The catheter will drain urine into a bag outside of your body. The procedure may vary among health care providers and hospitals. What happens after the procedure? Your blood pressure, heart rate, breathing rate, and blood oxygen level will be monitored until you leave the hospital or clinic. You may continue to receive fluids and medicines through an IV. You will be given pain medicine to relieve pain. You will have a catheter to drain your urine. The amount of urine will be measured. If you have blood in your urine, your bladder may be rinsed out by passing fluid through your catheter. You will be encouraged to walk as soon as you can. You may have to wear compression stockings. These stockings help to prevent blood clots and reduce swelling in your legs. If you were given a sedative during the procedure, it can affect you for several hours. Do not drive or operate machinery until your health care provider says that it is safe. Summary Transurethral resection of a bladder tumor is the removal (resection) of a cancerous growth (tumor) on the inside wall of the bladder. To do this procedure, your health care provider uses a thin telescope with a light, a tiny camera, and an electric cutting edge (resectoscope) that is guided to your bladder through your urethra. The part of your bladder that is affected by the tumor will be resected by the cutting edge of the resectoscope. A catheter will be passed through your urethra and into your bladder. The catheter will drain urine into a bag outside of your body. If you will be going home right after the procedure, plan to have a responsible adult take you home from the hospital or clinic. You will not be allowed to  drive. This information is not intended to replace advice given to you by your health care provider. Make sure you discuss any questions you have with your health care provider. Document Revised: 05/09/2021 Document Reviewed: 05/09/2021 Elsevier Patient Education  2024 [employer].

## 2023-01-14 ENCOUNTER — Other Ambulatory Visit: Payer: Self-pay

## 2023-01-14 DIAGNOSIS — C799 Secondary malignant neoplasm of unspecified site: Secondary | ICD-10-CM

## 2023-01-14 DIAGNOSIS — Z515 Encounter for palliative care: Secondary | ICD-10-CM

## 2023-01-14 DIAGNOSIS — G893 Neoplasm related pain (acute) (chronic): Secondary | ICD-10-CM

## 2023-01-14 DIAGNOSIS — R110 Nausea: Secondary | ICD-10-CM

## 2023-01-14 LAB — PROSTATE-SPECIFIC AG, SERUM (LABCORP): Prostate Specific Ag, Serum: 29.3 ng/mL — ABNORMAL HIGH (ref 0.0–4.0)

## 2023-01-14 MED ORDER — XTAMPZA ER 9 MG PO C12A
9.0000 mg | EXTENDED_RELEASE_CAPSULE | Freq: Two times a day (BID) | ORAL | 0 refills | Status: AC
Start: 2023-01-14 — End: ?

## 2023-01-14 MED ORDER — OXYCODONE HCL 10 MG PO TABS
10.0000 mg | ORAL_TABLET | ORAL | 0 refills | Status: AC | PRN
Start: 2023-01-14 — End: 2023-01-29

## 2023-01-14 MED ORDER — ONDANSETRON HCL 8 MG PO TABS: 8.0000 mg | ORAL_TABLET | Freq: Three times a day (TID) | ORAL | 2 refills | Status: AC | PRN

## 2023-01-14 NOTE — Telephone Encounter (Signed)
Pt wife called reporting that she was unable to pick up pt pain medication. [facility] and was informed that d/t the [facility] center being an "outside facility" the patient was not able to have the medications sent to their pharmacy. Pt wife called and updated and medication routed to preferred pharmacy

## 2023-01-17 ENCOUNTER — Emergency Department (HOSPITAL_COMMUNITY): Payer: No Typology Code available for payment source

## 2023-01-17 ENCOUNTER — Other Ambulatory Visit: Payer: Self-pay

## 2023-01-17 ENCOUNTER — Encounter (HOSPITAL_COMMUNITY): Payer: Self-pay | Admitting: Emergency Medicine

## 2023-01-17 ENCOUNTER — Inpatient Hospital Stay (HOSPITAL_COMMUNITY)
Admission: EM | Admit: 2023-01-17 | Discharge: 2023-01-27 | DRG: 643 | Disposition: A | Discharge status: Home / Self Care | Payer: No Typology Code available for payment source | Attending: Family Medicine | Admitting: Family Medicine

## 2023-01-17 DIAGNOSIS — Z8674 Personal history of sudden cardiac arrest: Secondary | ICD-10-CM

## 2023-01-17 DIAGNOSIS — I480 Paroxysmal atrial fibrillation: Secondary | ICD-10-CM | POA: Diagnosis present

## 2023-01-17 DIAGNOSIS — F4310 Post-traumatic stress disorder, unspecified: Secondary | ICD-10-CM | POA: Diagnosis present

## 2023-01-17 DIAGNOSIS — E669 Obesity, unspecified: Secondary | ICD-10-CM | POA: Diagnosis present

## 2023-01-17 DIAGNOSIS — I252 Old myocardial infarction: Secondary | ICD-10-CM

## 2023-01-17 DIAGNOSIS — I4901 Ventricular fibrillation: Secondary | ICD-10-CM | POA: Diagnosis present

## 2023-01-17 DIAGNOSIS — Z79899 Other long term (current) drug therapy: Secondary | ICD-10-CM

## 2023-01-17 DIAGNOSIS — D631 Anemia in chronic kidney disease: Secondary | ICD-10-CM | POA: Diagnosis present

## 2023-01-17 DIAGNOSIS — I2510 Atherosclerotic heart disease of native coronary artery without angina pectoris: Secondary | ICD-10-CM | POA: Diagnosis present

## 2023-01-17 DIAGNOSIS — G4733 Obstructive sleep apnea (adult) (pediatric): Secondary | ICD-10-CM | POA: Diagnosis present

## 2023-01-17 DIAGNOSIS — Z6835 Body mass index (BMI) 35.0-35.9, adult: Secondary | ICD-10-CM

## 2023-01-17 DIAGNOSIS — E875 Hyperkalemia: Secondary | ICD-10-CM | POA: Diagnosis present

## 2023-01-17 DIAGNOSIS — R0602 Shortness of breath: Secondary | ICD-10-CM | POA: Insufficient documentation

## 2023-01-17 DIAGNOSIS — E860 Dehydration: Secondary | ICD-10-CM | POA: Diagnosis present

## 2023-01-17 DIAGNOSIS — Z8616 Personal history of COVID-19: Secondary | ICD-10-CM

## 2023-01-17 DIAGNOSIS — I2489 Other forms of acute ischemic heart disease: Secondary | ICD-10-CM | POA: Diagnosis not present

## 2023-01-17 DIAGNOSIS — K761 Chronic passive congestion of liver: Secondary | ICD-10-CM | POA: Diagnosis present

## 2023-01-17 DIAGNOSIS — I130 Hypertensive heart and chronic kidney disease with heart failure and stage 1 through stage 4 chronic kidney disease, or unspecified chronic kidney disease: Secondary | ICD-10-CM | POA: Diagnosis present

## 2023-01-17 DIAGNOSIS — C679 Malignant neoplasm of bladder, unspecified: Secondary | ICD-10-CM | POA: Diagnosis present

## 2023-01-17 DIAGNOSIS — G8929 Other chronic pain: Secondary | ICD-10-CM | POA: Diagnosis present

## 2023-01-17 DIAGNOSIS — E222 Syndrome of inappropriate secretion of antidiuretic hormone: Secondary | ICD-10-CM | POA: Diagnosis not present

## 2023-01-17 DIAGNOSIS — Z7901 Long term (current) use of anticoagulants: Secondary | ICD-10-CM

## 2023-01-17 DIAGNOSIS — R748 Abnormal levels of other serum enzymes: Secondary | ICD-10-CM

## 2023-01-17 DIAGNOSIS — E876 Hypokalemia: Secondary | ICD-10-CM | POA: Diagnosis present

## 2023-01-17 DIAGNOSIS — E785 Hyperlipidemia, unspecified: Secondary | ICD-10-CM | POA: Diagnosis present

## 2023-01-17 DIAGNOSIS — N179 Acute kidney failure, unspecified: Secondary | ICD-10-CM | POA: Diagnosis present

## 2023-01-17 DIAGNOSIS — Z87891 Personal history of nicotine dependence: Secondary | ICD-10-CM

## 2023-01-17 DIAGNOSIS — M549 Dorsalgia, unspecified: Secondary | ICD-10-CM | POA: Diagnosis present

## 2023-01-17 DIAGNOSIS — N1832 Chronic kidney disease, stage 3b: Secondary | ICD-10-CM | POA: Diagnosis present

## 2023-01-17 DIAGNOSIS — I5033 Acute on chronic diastolic (congestive) heart failure: Secondary | ICD-10-CM | POA: Diagnosis not present

## 2023-01-17 DIAGNOSIS — K2100 Gastro-esophageal reflux disease with esophagitis, without bleeding: Secondary | ICD-10-CM | POA: Diagnosis present

## 2023-01-17 DIAGNOSIS — R7989 Other specified abnormal findings of blood chemistry: Secondary | ICD-10-CM | POA: Insufficient documentation

## 2023-01-17 DIAGNOSIS — Z95810 Presence of automatic (implantable) cardiac defibrillator: Secondary | ICD-10-CM

## 2023-01-17 DIAGNOSIS — I10 Essential (primary) hypertension: Secondary | ICD-10-CM | POA: Diagnosis present

## 2023-01-17 DIAGNOSIS — K219 Gastro-esophageal reflux disease without esophagitis: Secondary | ICD-10-CM | POA: Diagnosis present

## 2023-01-17 DIAGNOSIS — Z8673 Personal history of transient ischemic attack (TIA), and cerebral infarction without residual deficits: Secondary | ICD-10-CM

## 2023-01-17 DIAGNOSIS — J9601 Acute respiratory failure with hypoxia: Secondary | ICD-10-CM | POA: Diagnosis not present

## 2023-01-17 DIAGNOSIS — D649 Anemia, unspecified: Secondary | ICD-10-CM

## 2023-01-17 DIAGNOSIS — E782 Mixed hyperlipidemia: Secondary | ICD-10-CM | POA: Diagnosis present

## 2023-01-17 DIAGNOSIS — C7951 Secondary malignant neoplasm of bone: Secondary | ICD-10-CM | POA: Diagnosis present

## 2023-01-17 DIAGNOSIS — E8809 Other disorders of plasma-protein metabolism, not elsewhere classified: Secondary | ICD-10-CM | POA: Diagnosis present

## 2023-01-17 DIAGNOSIS — R7401 Elevation of levels of liver transaminase levels: Secondary | ICD-10-CM | POA: Insufficient documentation

## 2023-01-17 DIAGNOSIS — K759 Inflammatory liver disease, unspecified: Secondary | ICD-10-CM | POA: Diagnosis present

## 2023-01-17 DIAGNOSIS — E871 Hypo-osmolality and hyponatremia: Secondary | ICD-10-CM | POA: Diagnosis present

## 2023-01-17 NOTE — ED Triage Notes (Signed)
Pt c/o still not feeling good after having COVID several weeks ago. Pt c/o some SHOB, cough, fatigue and dizziness.

## 2023-01-18 ENCOUNTER — Inpatient Hospital Stay (HOSPITAL_COMMUNITY): Payer: No Typology Code available for payment source

## 2023-01-18 ENCOUNTER — Encounter (HOSPITAL_COMMUNITY): Payer: Self-pay | Admitting: Internal Medicine

## 2023-01-18 ENCOUNTER — Emergency Department (HOSPITAL_COMMUNITY): Payer: No Typology Code available for payment source

## 2023-01-18 DIAGNOSIS — R7989 Other specified abnormal findings of blood chemistry: Secondary | ICD-10-CM | POA: Diagnosis not present

## 2023-01-18 DIAGNOSIS — N179 Acute kidney failure, unspecified: Secondary | ICD-10-CM

## 2023-01-18 DIAGNOSIS — E782 Mixed hyperlipidemia: Secondary | ICD-10-CM | POA: Diagnosis present

## 2023-01-18 DIAGNOSIS — D631 Anemia in chronic kidney disease: Secondary | ICD-10-CM | POA: Diagnosis present

## 2023-01-18 DIAGNOSIS — E860 Dehydration: Secondary | ICD-10-CM | POA: Diagnosis present

## 2023-01-18 DIAGNOSIS — K759 Inflammatory liver disease, unspecified: Secondary | ICD-10-CM | POA: Diagnosis present

## 2023-01-18 DIAGNOSIS — I4901 Ventricular fibrillation: Secondary | ICD-10-CM | POA: Diagnosis present

## 2023-01-18 DIAGNOSIS — K219 Gastro-esophageal reflux disease without esophagitis: Secondary | ICD-10-CM | POA: Insufficient documentation

## 2023-01-18 DIAGNOSIS — J9601 Acute respiratory failure with hypoxia: Secondary | ICD-10-CM | POA: Diagnosis not present

## 2023-01-18 DIAGNOSIS — I480 Paroxysmal atrial fibrillation: Secondary | ICD-10-CM | POA: Diagnosis present

## 2023-01-18 DIAGNOSIS — E871 Hypo-osmolality and hyponatremia: Secondary | ICD-10-CM | POA: Diagnosis present

## 2023-01-18 DIAGNOSIS — I2489 Other forms of acute ischemic heart disease: Secondary | ICD-10-CM | POA: Diagnosis not present

## 2023-01-18 DIAGNOSIS — G8929 Other chronic pain: Secondary | ICD-10-CM | POA: Diagnosis present

## 2023-01-18 DIAGNOSIS — R7401 Elevation of levels of liver transaminase levels: Secondary | ICD-10-CM | POA: Diagnosis not present

## 2023-01-18 DIAGNOSIS — D649 Anemia, unspecified: Secondary | ICD-10-CM

## 2023-01-18 DIAGNOSIS — Z515 Encounter for palliative care: Secondary | ICD-10-CM | POA: Diagnosis not present

## 2023-01-18 DIAGNOSIS — R0602 Shortness of breath: Secondary | ICD-10-CM | POA: Diagnosis not present

## 2023-01-18 DIAGNOSIS — R55 Syncope and collapse: Secondary | ICD-10-CM | POA: Diagnosis not present

## 2023-01-18 DIAGNOSIS — I130 Hypertensive heart and chronic kidney disease with heart failure and stage 1 through stage 4 chronic kidney disease, or unspecified chronic kidney disease: Secondary | ICD-10-CM | POA: Diagnosis present

## 2023-01-18 DIAGNOSIS — D638 Anemia in other chronic diseases classified elsewhere: Secondary | ICD-10-CM

## 2023-01-18 DIAGNOSIS — I5033 Acute on chronic diastolic (congestive) heart failure: Secondary | ICD-10-CM | POA: Diagnosis not present

## 2023-01-18 DIAGNOSIS — E222 Syndrome of inappropriate secretion of antidiuretic hormone: Secondary | ICD-10-CM | POA: Diagnosis present

## 2023-01-18 DIAGNOSIS — E875 Hyperkalemia: Secondary | ICD-10-CM | POA: Diagnosis present

## 2023-01-18 DIAGNOSIS — Z7189 Other specified counseling: Secondary | ICD-10-CM | POA: Diagnosis not present

## 2023-01-18 DIAGNOSIS — K761 Chronic passive congestion of liver: Secondary | ICD-10-CM | POA: Diagnosis present

## 2023-01-18 DIAGNOSIS — I4891 Unspecified atrial fibrillation: Secondary | ICD-10-CM | POA: Diagnosis not present

## 2023-01-18 DIAGNOSIS — R778 Other specified abnormalities of plasma proteins: Secondary | ICD-10-CM | POA: Diagnosis not present

## 2023-01-18 DIAGNOSIS — C7951 Secondary malignant neoplasm of bone: Secondary | ICD-10-CM | POA: Diagnosis present

## 2023-01-18 DIAGNOSIS — C679 Malignant neoplasm of bladder, unspecified: Secondary | ICD-10-CM | POA: Diagnosis present

## 2023-01-18 DIAGNOSIS — R748 Abnormal levels of other serum enzymes: Secondary | ICD-10-CM | POA: Diagnosis not present

## 2023-01-18 DIAGNOSIS — F4310 Post-traumatic stress disorder, unspecified: Secondary | ICD-10-CM | POA: Diagnosis present

## 2023-01-18 DIAGNOSIS — E785 Hyperlipidemia, unspecified: Secondary | ICD-10-CM | POA: Diagnosis not present

## 2023-01-18 DIAGNOSIS — Z8616 Personal history of COVID-19: Secondary | ICD-10-CM | POA: Diagnosis not present

## 2023-01-18 DIAGNOSIS — E8809 Other disorders of plasma-protein metabolism, not elsewhere classified: Secondary | ICD-10-CM | POA: Diagnosis present

## 2023-01-18 DIAGNOSIS — I2510 Atherosclerotic heart disease of native coronary artery without angina pectoris: Secondary | ICD-10-CM | POA: Diagnosis present

## 2023-01-18 DIAGNOSIS — E669 Obesity, unspecified: Secondary | ICD-10-CM | POA: Diagnosis present

## 2023-01-18 DIAGNOSIS — E876 Hypokalemia: Secondary | ICD-10-CM | POA: Diagnosis present

## 2023-01-18 DIAGNOSIS — N1832 Chronic kidney disease, stage 3b: Secondary | ICD-10-CM | POA: Diagnosis present

## 2023-01-18 DIAGNOSIS — I10 Essential (primary) hypertension: Secondary | ICD-10-CM

## 2023-01-18 LAB — CBC WITH DIFFERENTIAL/PLATELET
Abs Immature Granulocytes: 0.44 10*3/uL — ABNORMAL HIGH (ref 0.00–0.07)
Basophils Absolute: 0 10*3/uL (ref 0.0–0.1)
Basophils Relative: 0 %
Eosinophils Absolute: 0.1 10*3/uL (ref 0.0–0.5)
Eosinophils Relative: 2 %
HCT: 22.4 % — ABNORMAL LOW (ref 39.0–52.0)
Hemoglobin: 7 g/dL — ABNORMAL LOW (ref 13.0–17.0)
Immature Granulocytes: 5 %
Lymphocytes Relative: 20 %
Lymphs Abs: 1.7 10*3/uL (ref 0.7–4.0)
MCH: 30.3 pg (ref 26.0–34.0)
MCHC: 31.3 g/dL (ref 30.0–36.0)
MCV: 97 fL (ref 80.0–100.0)
Monocytes Absolute: 0.5 10*3/uL (ref 0.1–1.0)
Monocytes Relative: 6 %
Neutro Abs: 5.8 10*3/uL (ref 1.7–7.7)
Neutrophils Relative %: 67 %
Platelets: 344 10*3/uL (ref 150–400)
RBC: 2.31 MIL/uL — ABNORMAL LOW (ref 4.22–5.81)
RDW: 17.1 % — ABNORMAL HIGH (ref 11.5–15.5)
WBC: 8.6 10*3/uL (ref 4.0–10.5)
nRBC: 1.3 % — ABNORMAL HIGH (ref 0.0–0.2)

## 2023-01-18 LAB — URINALYSIS, ROUTINE W REFLEX MICROSCOPIC
Bilirubin Urine: NEGATIVE
Glucose, UA: NEGATIVE mg/dL
Hgb urine dipstick: NEGATIVE
Ketones, ur: NEGATIVE mg/dL
Leukocytes,Ua: NEGATIVE
Nitrite: NEGATIVE
Protein, ur: NEGATIVE mg/dL
Specific Gravity, Urine: 1.018 (ref 1.005–1.030)
pH: 5 (ref 5.0–8.0)

## 2023-01-18 LAB — BASIC METABOLIC PANEL
Anion gap: 12 (ref 5–15)
Anion gap: 11 (ref 5–15)
BUN: 48 mg/dL — ABNORMAL HIGH (ref 8–23)
BUN: 51 mg/dL — ABNORMAL HIGH (ref 8–23)
CO2: 23 mmol/L (ref 22–32)
CO2: 25 mmol/L (ref 22–32)
Calcium: 8.1 mg/dL — ABNORMAL LOW (ref 8.9–10.3)
Calcium: 7.8 mg/dL — ABNORMAL LOW (ref 8.9–10.3)
Chloride: 85 mmol/L — ABNORMAL LOW (ref 98–111)
Chloride: 85 mmol/L — ABNORMAL LOW (ref 98–111)
Creatinine, Ser: 2.66 mg/dL — ABNORMAL HIGH (ref 0.61–1.24)
Creatinine, Ser: 2.75 mg/dL — ABNORMAL HIGH (ref 0.61–1.24)
GFR, Estimated: 24 mL/min — ABNORMAL LOW (ref >60)
GFR, Estimated: 23 mL/min — ABNORMAL LOW (ref >60)
Glucose, Bld: 88 mg/dL (ref 70–99)
Glucose, Bld: 88 mg/dL (ref 70–99)
Potassium: 5.5 mmol/L — ABNORMAL HIGH (ref 3.5–5.1)
Potassium: 5.3 mmol/L — ABNORMAL HIGH (ref 3.5–5.1)
Sodium: 120 mmol/L — ABNORMAL LOW (ref 135–145)
Sodium: 121 mmol/L — ABNORMAL LOW (ref 135–145)

## 2023-01-18 LAB — COMPREHENSIVE METABOLIC PANEL
ALT: 326 U/L — ABNORMAL HIGH (ref 0–44)
AST: 340 U/L — ABNORMAL HIGH (ref 15–41)
Albumin: 3.4 g/dL — ABNORMAL LOW (ref 3.5–5.0)
Alkaline Phosphatase: 628 U/L — ABNORMAL HIGH (ref 38–126)
Anion gap: 11 (ref 5–15)
BUN: 46 mg/dL — ABNORMAL HIGH (ref 8–23)
CO2: 27 mmol/L (ref 22–32)
Calcium: 8.2 mg/dL — ABNORMAL LOW (ref 8.9–10.3)
Chloride: 86 mmol/L — ABNORMAL LOW (ref 98–111)
Creatinine, Ser: 2.54 mg/dL — ABNORMAL HIGH (ref 0.61–1.24)
GFR, Estimated: 26 mL/min — ABNORMAL LOW (ref >60)
Glucose, Bld: 121 mg/dL — ABNORMAL HIGH (ref 70–99)
Potassium: 5.9 mmol/L — ABNORMAL HIGH (ref 3.5–5.1)
Sodium: 124 mmol/L — ABNORMAL LOW (ref 135–145)
Total Bilirubin: 1 mg/dL (ref 0.3–1.2)
Total Protein: 6.9 g/dL (ref 6.5–8.1)

## 2023-01-18 LAB — PHOSPHORUS: Phosphorus: 4 mg/dL (ref 2.5–4.6)

## 2023-01-18 LAB — MRSA NEXT GEN BY PCR, NASAL: MRSA by PCR Next Gen: NOT DETECTED

## 2023-01-18 LAB — CBC
HCT: 20.9 % — ABNORMAL LOW (ref 39.0–52.0)
Hemoglobin: 6.6 g/dL — CL (ref 13.0–17.0)
MCH: 30.6 pg (ref 26.0–34.0)
MCHC: 31.6 g/dL (ref 30.0–36.0)
MCV: 96.8 fL (ref 80.0–100.0)
Platelets: 347 10*3/uL (ref 150–400)
RBC: 2.16 MIL/uL — ABNORMAL LOW (ref 4.22–5.81)
RDW: 17.2 % — ABNORMAL HIGH (ref 11.5–15.5)
WBC: 7.8 10*3/uL (ref 4.0–10.5)
nRBC: 1.4 % — ABNORMAL HIGH (ref 0.0–0.2)

## 2023-01-18 LAB — LIPASE, BLOOD: Lipase: 37 U/L (ref 11–51)

## 2023-01-18 LAB — IRON AND TIBC
Iron: 150 ug/dL (ref 45–182)
Saturation Ratios: 57 % — ABNORMAL HIGH (ref 17.9–39.5)
TIBC: 264 ug/dL (ref 250–450)
UIBC: 114 ug/dL

## 2023-01-18 LAB — OSMOLALITY: Osmolality: 285 mosm/kg (ref 275–295)

## 2023-01-18 LAB — MAGNESIUM: Magnesium: 3.4 mg/dL — ABNORMAL HIGH (ref 1.7–2.4)

## 2023-01-18 LAB — HEPATITIS PANEL, ACUTE
HCV Ab: NONREACTIVE
Hep A IgM: NONREACTIVE
Hep B C IgM: NONREACTIVE
Hepatitis B Surface Ag: NONREACTIVE

## 2023-01-18 LAB — SODIUM, URINE, RANDOM: Sodium, Ur: <10 mmol/L

## 2023-01-18 LAB — D-DIMER, QUANTITATIVE: D-Dimer, Quant: 6.95 ug{FEU}/mL — ABNORMAL HIGH (ref 0.00–0.50)

## 2023-01-18 LAB — HEPARIN LEVEL (UNFRACTIONATED)
Heparin Unfractionated: 0.22 [IU]/mL — ABNORMAL LOW (ref 0.30–0.70)
Heparin Unfractionated: 0.12 [IU]/mL — ABNORMAL LOW (ref 0.30–0.70)

## 2023-01-18 LAB — FERRITIN: Ferritin: 4258 ng/mL — ABNORMAL HIGH (ref 24–336)

## 2023-01-18 MED ORDER — ONDANSETRON HCL 4 MG/2ML IJ SOLN
4.0000 mg | Freq: Once | INTRAMUSCULAR | Status: AC
  Administered 2023-01-18: 4 mg via INTRAVENOUS
  Filled 2023-01-18: qty 2

## 2023-01-18 MED ORDER — CHLORHEXIDINE GLUCONATE CLOTH 2 % EX PADS
6.0000 | MEDICATED_PAD | Freq: Every day | CUTANEOUS | Status: DC
  Administered 2023-01-18 – 2023-01-24 (×7): 6 via TOPICAL

## 2023-01-18 MED ORDER — HEPARIN BOLUS VIA INFUSION
3000.0000 [IU] | Freq: Once | INTRAVENOUS | Status: AC
  Administered 2023-01-18: 3000 [IU] via INTRAVENOUS

## 2023-01-18 MED ORDER — ENSURE ENLIVE PO LIQD
237.0000 mL | Freq: Two times a day (BID) | ORAL | Status: DC
  Administered 2023-01-18 – 2023-01-27 (×13): 237 mL via ORAL

## 2023-01-18 MED ORDER — SODIUM CHLORIDE 0.9 % IV BOLUS
1000.0000 mL | Freq: Once | INTRAVENOUS | Status: AC
  Administered 2023-01-18: 1000 mL via INTRAVENOUS

## 2023-01-18 MED ORDER — SODIUM CHLORIDE 0.9 % IV SOLN: INTRAVENOUS | Status: AC

## 2023-01-18 MED ORDER — OXYCODONE HCL 5 MG PO TABS
10.0000 mg | ORAL_TABLET | ORAL | Status: DC | PRN
  Administered 2023-01-18 – 2023-01-26 (×8): 10 mg via ORAL
  Filled 2023-01-18 (×8): qty 2

## 2023-01-18 MED ORDER — TECHNETIUM TO 99M ALBUMIN AGGREGATED
4.0000 | Freq: Once | INTRAVENOUS | Status: AC | PRN
  Administered 2023-01-18: 4.4 via INTRAVENOUS

## 2023-01-18 MED ORDER — ACETAMINOPHEN 650 MG RE SUPP: 650.0000 mg | Freq: Four times a day (QID) | RECTAL | Status: DC | PRN

## 2023-01-18 MED ORDER — PROCHLORPERAZINE EDISYLATE 10 MG/2ML IJ SOLN
10.0000 mg | Freq: Four times a day (QID) | INTRAMUSCULAR | Status: DC | PRN
  Administered 2023-01-18: 10 mg via INTRAVENOUS
  Filled 2023-01-18: qty 2

## 2023-01-18 MED ORDER — ATORVASTATIN CALCIUM 40 MG PO TABS
40.0000 mg | ORAL_TABLET | Freq: Every day | ORAL | Status: DC
  Administered 2023-01-18 – 2023-01-19 (×2): 40 mg via ORAL
  Filled 2023-01-18 (×2): qty 1

## 2023-01-18 MED ORDER — PANTOPRAZOLE SODIUM 40 MG PO TBEC
40.0000 mg | DELAYED_RELEASE_TABLET | Freq: Every day | ORAL | Status: DC
  Administered 2023-01-18 – 2023-01-19 (×2): 40 mg via ORAL
  Filled 2023-01-18 (×2): qty 1

## 2023-01-18 MED ORDER — POLYETHYLENE GLYCOL 3350 17 G PO PACK
17.0000 g | PACK | Freq: Two times a day (BID) | ORAL | Status: DC
  Administered 2023-01-18 – 2023-01-27 (×17): 17 g via ORAL
  Filled 2023-01-18 (×17): qty 1

## 2023-01-18 MED ORDER — ACETAMINOPHEN 325 MG PO TABS
650.0000 mg | ORAL_TABLET | Freq: Four times a day (QID) | ORAL | Status: DC | PRN
  Administered 2023-01-19 – 2023-01-20 (×2): 650 mg via ORAL
  Filled 2023-01-18 (×2): qty 2

## 2023-01-18 MED ORDER — HEPARIN (PORCINE) 25000 UT/250ML-% IV SOLN
2200.0000 [IU]/h | INTRAVENOUS | Status: DC
  Administered 2023-01-18: 1450 [IU]/h via INTRAVENOUS
  Administered 2023-01-19 – 2023-01-23 (×8): 1900 [IU]/h via INTRAVENOUS
  Administered 2023-01-24 (×2): 2200 [IU]/h via INTRAVENOUS
  Filled 2023-01-18 (×12): qty 250

## 2023-01-18 MED ORDER — ADULT MULTIVITAMIN W/MINERALS CH
1.0000 | ORAL_TABLET | Freq: Every day | ORAL | Status: DC
  Administered 2023-01-18 – 2023-01-27 (×10): 1 via ORAL
  Filled 2023-01-18 (×10): qty 1

## 2023-01-18 MED ORDER — OXYCODONE HCL ER 10 MG PO T12A
10.0000 mg | EXTENDED_RELEASE_TABLET | Freq: Two times a day (BID) | ORAL | Status: DC
  Administered 2023-01-18 – 2023-01-27 (×17): 10 mg via ORAL
  Filled 2023-01-18 (×17): qty 1

## 2023-01-18 MED ORDER — SODIUM ZIRCONIUM CYCLOSILICATE 5 G PO PACK
10.0000 g | PACK | Freq: Once | ORAL | Status: AC
  Administered 2023-01-18: 10 g via ORAL
  Filled 2023-01-18: qty 2

## 2023-01-18 MED ORDER — TRAZODONE HCL 50 MG PO TABS
50.0000 mg | ORAL_TABLET | Freq: Every day | ORAL | Status: DC
  Administered 2023-01-18 – 2023-01-26 (×8): 50 mg via ORAL
  Filled 2023-01-18 (×8): qty 1

## 2023-01-18 NOTE — Consult Note (Signed)
Katrinka Blazing, M.D. Gastroenterology & Hepatology                                           Patient Name: Luis Donovan Account #: @FLAACCTNO @   MRN: [id number] Admission Date: 01/17/2023 Date of Evaluation:  01/18/2023 Time of Evaluation: 8:20 AM   Referring Physician: Vassie Loll, MD  Chief Complaint: Anemia and elevation of liver enzymes  HPI:  This is a 77 y.o. 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  Gastroenterology was consulted for evaluation of anemia and abnormal LFTs.  Notably, patient was admitted to [facility] long hospital due to recent COVID infection.  He reports that after he contracted the infection he has been feeling persistently weak and with recurrent shortness of breath with exertion.  He denies having any melena, hematochezia, fever, chills, abdominal distention, nausea, vomiting, hematemesis.  He denies taking any anticoagulants.  Denies any EGD or colonoscopy in the last 10 yearsBut he reports he had a colonoscopy in the past.  No reports are available in the chart.  In the ED, she was HD stable and afebrile. Labs were remarkable for Na 124, K 5.9, Cr 2.54 BUN 46 alk phos 68, AST 340, ALT 326, total bilirubin 1.0, CBC with hemoglobin of 7.0, WBC 8.6, platelets 344.  Notably, prior to discharge from [facility], he was found to have an elevated CK of 301, his alkaline phosphatase was elevated at 552 but his aminotransferases were within normal limits and his  creatinine was 1.4.  Also his hemoglobin was 7.3.  Folic acid and vitamin B12 levels were normal last month, but ferritin was elevated at 509 and iron stores showed elevated TIBC without any other abnormalities.  The patient had initial infection with COVID in early August but was readmitted to [facility] in mid  August after presenting worsening weakness. His only new medication was monupiravir, which was prescribed on 12/18/22.  The patient is currently on heparin drip as he has anemia but also had a VQ scan that showed intermediate risk of a pulmonary embolus.  Past Medical History: SEE CHRONIC ISSSUES: Past Medical History:  Diagnosis Date   Anemia    CVA (cerebral vascular accident) (HCC) 07/17/2013   Empyema (HCC)    Erectile dysfunction due to arterial insufficiency    Essential (primary) hypertension    Gastro-esophageal reflux disease with esophagitis    Head injury    Heart disease    Hyperlipidemia, unspecified    Hypertension    Obesity, unspecified    Pneumonia    Post-traumatic stress disorder, unspecified    Sleep apnea, unspecified    Past Surgical History:  Past Surgical History:  Procedure Laterality Date   CARDIAC DEFIBRILLATOR PLACEMENT     LUNG SURGERY     operation on skin of neck     ROTATOR CUFF REPAIR Left    TENDON REPAIR     VIDEO ASSISTED THORACOSCOPY     Family History: History reviewed. No pertinent family history. Social History:  Social History   Tobacco Use   Smoking status: Former   Smokeless tobacco: Never  Advertising account planner   Vaping status: Never Used  Substance Use Topics   Alcohol use: No   Drug  use: No    Home Medications:  Prior to Admission medications   Medication Sig Start Date End Date Taking? Authorizing Provider  acetaminophen (TYLENOL) 325 MG tablet Take 2 tablets (650 mg total) by mouth every 6 (six) hours as needed for mild pain (or Fever >/= 101). 01/01/23   Alwyn Ren, MD  albuterol (VENTOLIN HFA) 108 (90 Base) MCG/ACT inhaler Inhale 2 puffs into the lungs every 4 (four) hours as needed for wheezing or shortness of breath. 07/16/22   [provider]  apixaban (ELIQUIS) 5 MG TABS tablet Take 5 mg by mouth 2 (two) times daily.    [provider]  atorvastatin (LIPITOR) 80 MG tablet Take 40 mg by mouth daily.  06/04/22   [provider]  Carboxymethylcellulose Sod PF 0.5 % SOLN Place 1 drop into both eyes 2 (two) times daily. For dry eyes    [provider]  cetirizine (ZYRTEC) 10 MG tablet Take 10 mg by mouth at bedtime.    [provider]  Cholecalciferol 50 MCG (2000 UT) TABS Take 1 tablet by mouth every morning. 07/16/22   [provider]  clotrimazole (LOTRIMIN) 1 % cream Apply 1 Application topically 2 (two) times daily. Apply a small amount to affected area twice daily for feet.    [provider]  cyanocobalamin (VITAMIN B12) 500 MCG tablet Take 1 tablet by mouth daily. 07/16/22   [provider]  cyclobenzaprine (FLEXERIL) 5 MG tablet Take 1 tablet (5 mg total) by mouth 3 (three) times daily as needed for muscle spasms. 01/13/23   Pickenpack-Cousar, Arty Baumgartner, NP  diclofenac Sodium (VOLTAREN) 1 % GEL Apply 2 g topically 4 (four) times daily as needed (pain). 07/16/22   [provider]  FLUoxetine (PROZAC) 20 MG capsule Take 80 mg by mouth daily. 03/03/22   [provider]  guaiFENesin-dextromethorphan (ROBITUSSIN DM) 100-10 MG/5ML syrup Take 5 mLs by mouth every 4 (four) hours as needed for cough. 12/18/22   Sloan Leiter, DO  hydrALAZINE (APRESOLINE) 50 MG tablet Take 50 mg by mouth 3 (three) times daily. 07/16/22   [provider]  KETOTIFEN FUMARATE OP Apply 1 drop to eye 2 (two) times daily as needed (allergies). 0.025%    [provider]  latanoprost (XALATAN) 0.005 % ophthalmic solution Place 1 drop into both eyes at bedtime.    [provider]  lidocaine (LIDODERM) 5 % Place 1 patch onto the skin daily. Apply 1 patch to skin once daily (Apply for  hours, then remove for  hours) 07/16/22   [provider]  losartan (COZAAR) 100 MG tablet Take 0.5 tablets (50 mg total) by mouth daily. 01/04/23   Alwyn Ren, MD  metoprolol succinate (TOPROL-XL) 50 MG 24 hr tablet Take 0.5 tablets by mouth  daily. 06/04/22   [provider]  omeprazole (PRILOSEC) 20 MG capsule Take 20 mg by mouth 2 (two) times daily before a meal. Take on an empty stomach 30 minutes prior to a meal    [provider]  ondansetron (ZOFRAN) 8 MG tablet Take 1 tablet (8 mg total) by mouth every 8 (eight) hours as needed for nausea or vomiting. 01/14/23   Pickenpack-Cousar, Arty Baumgartner, NP  oxyCODONE ER (XTAMPZA ER) 9 MG C12A Take 9 mg by mouth every 12 (twelve) hours. 01/14/23   Pickenpack-Cousar, Arty Baumgartner, NP  Oxycodone HCl 10 MG TABS Take 1 tablet (10 mg total) by mouth every 4 (four) hours as needed for up  to 15 days. 01/14/23 01/29/23  Pickenpack-Cousar, Arty Baumgartner, NP  polyethylene glycol (MIRALAX / GLYCOLAX) 17 g packet Take 17 g by mouth 2 (two) times daily. 01/13/23   Pickenpack-Cousar, Arty Baumgartner, NP  Skin Protectants, Misc. (EUCERIN) cream Apply 1 Application topically 2 (two) times daily. Apply a small amount to affected area twice daily    [provider]  timolol (TIMOPTIC-XR) 0.5 % ophthalmic gel-forming Apply 1 drop to eye daily. 08/13/22   [provider]  torsemide (DEMADEX) 20 MG tablet Take 1 tablet (20 mg total) by mouth 2 (two) times daily. For Fluid 01/05/23   Alwyn Ren, MD  traZODone (DESYREL) 50 MG tablet Take 50 mg by mouth at bedtime. 02/16/13   [provider]    Inpatient Medications:  Current Facility-Administered Medications:    0.9 %  sodium chloride infusion, , Intravenous, Continuous, Adefeso, Oladapo, DO   acetaminophen (TYLENOL) tablet 650 mg, 650 mg, Oral, Q6H PRN **OR** acetaminophen (TYLENOL) suppository 650 mg, 650 mg, Rectal, Q6H PRN, Adefeso, Oladapo, DO   atorvastatin (LIPITOR) tablet 40 mg, 40 mg, Oral, Daily, Adefeso, Oladapo, DO   Chlorhexidine Gluconate Cloth 2 % PADS 6 each, 6 each, Topical, Q0600, Adefeso, Oladapo, DO, 6 each at 01/18/23 0423   feeding supplement (ENSURE ENLIVE / ENSURE PLUS) liquid 237 mL, 237 mL, Oral, BID BM,  Adefeso, Oladapo, DO   heparin ADULT infusion 100 units/mL (25000 units/229mL), 1,450 Units/hr, Intravenous, Continuous, Adefeso, Oladapo, DO, Last Rate: 14.5 mL/hr at 01/18/23 0322, 1,450 Units/hr at 01/18/23 0322   oxyCODONE (Oxy IR/ROXICODONE) immediate release tablet 10 mg, 10 mg, Oral, Q4H PRN, Adefeso, Oladapo, DO   oxyCODONE (OXYCONTIN) 12 hr tablet 10 mg, 10 mg, Oral, Q12H, Adefeso, Oladapo, DO   pantoprazole (PROTONIX) EC tablet 40 mg, 40 mg, Oral, Daily, Adefeso, Oladapo, DO   polyethylene glycol (MIRALAX / GLYCOLAX) packet 17 g, 17 g, Oral, BID, Adefeso, Oladapo, DO   prochlorperazine (COMPAZINE) injection 10 mg, 10 mg, Intravenous, Q6H PRN, Adefeso, Oladapo, DO Allergies: Patient has no known allergies.  Complete Review of Systems: GENERAL: negative for malaise, night sweats HEENT: No changes in hearing or vision, no nose bleeds or other nasal problems. NECK: Negative for lumps, goiter, pain and significant neck swelling RESPIRATORY: Negative for cough, wheezing CARDIOVASCULAR: Negative for chest pain, leg swelling, palpitations, orthopnea GI: SEE HPI MUSCULOSKELETAL: Negative for joint pain or swelling, back pain, and muscle pain. SKIN: Negative for lesions, rash PSYCH: Negative for sleep disturbance, mood disorder and recent psychosocial stressors. HEMATOLOGY Negative for prolonged bleeding, bruising easily, and swollen nodes. ENDOCRINE: Negative for cold or heat intolerance, polyuria, polydipsia and goiter. NEURO: negative for tremor, gait imbalance, syncope and seizures. The remainder of the review of systems is noncontributory.  Physical Exam: BP 94/68   Pulse 70   Temp 97.7 F (36.5 C) (Oral)   Resp 19   Ht 5\' 7"  (1.702 m)   Wt 104 kg   SpO2 100%   BMI 35.91 kg/m  GENERAL: The patient is AO x3, in no acute distress. HEENT: Head is normocephalic and atraumatic. EOMI are intact. Mouth is well hydrated and without lesions. NECK: Supple. No masses LUNGS: Clear to  auscultation. No presence of rhonchi/wheezing/rales. Adequate chest expansion HEART: RRR, normal s1 and s2. ABDOMEN: Soft, nontender, no guarding, no peritoneal signs, and nondistended. BS +. No masses. EXTREMITIES: Without any cyanosis, clubbing, rash, lesions or edema. NEUROLOGIC: AOx3, no focal motor deficit. SKIN: no jaundice, no rashes  Laboratory Data CBC:  Component Value Date/Time   WBC 8.6 01/18/2023 0105   RBC 2.31 (L) 01/18/2023 0105   HGB 7.0 (L) 01/18/2023 0105   HGB 7.3 (L) 01/13/2023 1039   HCT 22.4 (L) 01/18/2023 0105   PLT 344 01/18/2023 0105   PLT 296 01/13/2023 1039   MCV 97.0 01/18/2023 0105   MCH 30.3 01/18/2023 0105   MCHC 31.3 01/18/2023 0105   RDW 17.1 (H) 01/18/2023 0105   LYMPHSABS 1.7 01/18/2023 0105   MONOABS 0.5 01/18/2023 0105   EOSABS 0.1 01/18/2023 0105   BASOSABS 0.0 01/18/2023 0105   COAG:  Lab Results  Component Value Date   INR 1.17 09/18/2010    BMP:     Latest Ref Rng & Units 01/18/2023    1:05 AM 01/13/2023   10:39 AM 12/31/2022    5:38 AM  BMP  Glucose 70 - 99 mg/dL 409  811  94   BUN 8 - 23 mg/dL 46  35  37   Creatinine 0.61 - 1.24 mg/dL 9.14  7.82  9.56   Sodium 135 - 145 mmol/L 124  131  134   Potassium 3.5 - 5.1 mmol/L 5.9  4.4  4.7   Chloride 98 - 111 mmol/L 86  94  101   CO2 22 - 32 mmol/L 27  31  24    Calcium 8.9 - 10.3 mg/dL 8.2  9.0  8.3     HEPATIC:     Latest Ref Rng & Units 01/18/2023    1:05 AM 01/13/2023   10:39 AM 12/30/2022    9:25 AM  Hepatic Function  Total Protein 6.5 - 8.1 g/dL 6.9  6.9  7.2   Albumin 3.5 - 5.0 g/dL 3.4  3.5  3.7   AST 15 - 41 U/L 340  40  23   ALT 0 - 44 U/L 326  34  14   Alk Phosphatase 38 - 126 U/L 628  552  438   Total Bilirubin 0.3 - 1.2 mg/dL 1.0  0.4  0.5     CARDIAC:  Lab Results  Component Value Date   CKTOTAL 177 12/18/2022     Imaging: I personally reviewed and interpreted the available imaging.  Assessment & Plan: CASSIUS KABLE is a  77 y.o. 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  Gastroenterology was consulted for evaluation of anemia and abnormal LFTs.  The patient has presented worsening anemia of unclear etiology.  He is iron stores have shown elevated ferritin but no evidence of iron deficiency.  It is possible that part of his anemia is related to anemia of chronic disease given his metastatic malignancy.  However, we will be important to perform an EGD and colonoscopy once his renal function has improved.  He will need to have his heparin held 6 hours prior to performing this.  Will reevaluate his clinical condition tomorrow to determine if this could be performed on Wednesday.  In terms of his elevated LFTs, he had elevated alkaline phosphatase.  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  He has not started any hepatotoxic medication recently (molnupiravir is unlikely to have caused his LFT elevation).  Ultrasound performed today did not show any presence of biliary duct dilation.  In fact he had CT of the chest,  abdomen and pelvis a little more than a month ago that was negative for biliary dilation or obstruction.  We will check serologies to rule out autoimmune, viral or metabolic etiologies  Will also recheck an LDH and a alkaline phosphatase is on enzymes.  - Daily H/H, transfuse for Hb >7 - May perform EGD/colonoscopy once renal function/electrolytes are corrected - Active T/S - Follow up acute hepatitis panel, iron panel, ANA, AMA, ASMA, IgG - Check LDG and alk phosphatase isoenzymes - Clarify goals of care with palliative care  Katrinka Blazing, MD Gastroenterology and Hepatology [facility]

## 2023-01-18 NOTE — Progress Notes (Addendum)
ANTICOAGULATION CONSULT NOTE - Follow Up Consult  Pharmacy Consult for heparin Indication: pulmonary embolus  Labs: Recent Labs    01/18/23 0105 01/18/23 1117 01/18/23 1653 01/18/23 2134  HGB 7.0* 6.6*  --   --   HCT 22.4* 20.9*  --   --   PLT 344 347  --   --   HEPARINUNFRC  --  0.22*  --  0.12*  CREATININE 2.54* 2.66* 2.75*  --     Assessment: 76yo male subtherapeutic on heparin with lower heparin level despite increased rate, likely from bolus this am; no infusion issues or signs of bleeding per RN.  Goal of Therapy:  Heparin level 0.3-0.7 units/ml   Plan:  Increase heparin infusion by 3-4 units/kgABW/hr to 1900 units/hr. Check level in 8 hours.   Vernard Gambles, PharmD, BCPS 01/18/2023 11:10 PM

## 2023-01-18 NOTE — Progress Notes (Signed)
Patient seen and examined; admitted after midnight secondary to shortness of breath, generalized weakness, decreased appetite and dehydration.  Workup demonstrating acute on chronic renal failure, transaminitis, hyponatremia and significant elevation of D-dimer.  Patient reports feeling slightly better and he was not in any acute distress at time of my examination.  Please refer to H&P written by Dr. Thomes Dinning on 01/18/2023 for further info/details on admission.  Plan: -Continue heparin drip -Follow GI service recommendations regarding workup and evaluation needed for ongoing transaminitis. -Patient's VQ scan results demonstrating intermediate probability; he will need treatment with oral anticoagulation at time of discharge. -Maintain adequate hydration, replete electrolytes and follow renal function trend.    Vassie Loll MD [phone]

## 2023-01-18 NOTE — H&P (Signed)
History and Physical    Patient: Luis Donovan HYQ:[id number] DOB: 1945/10/22 DOA: 01/17/2023 DOS: the patient was seen and examined on 01/18/2023 PCP: [facility]  Patient coming from: Home  Chief Complaint:  Chief Complaint  Patient presents with   Multiple Complaints   HPI: Luis Donovan is a 77 y.o. male with medical history significant of hypertension, hyperlipidemia, known metastatic disease with unknown primary, GERD, PTSD, OSA, CVA, anemia of chronic disease who presents to the emergency department accompanied by wife due to shortness of breath which has been ongoing for several weeks, but worsened last couple of days.  Exertion and walking aggravates the shortness of breath, he also complained of fatigue and dizziness. Patient was admitted from 8/14 to 8/16 at [facility] long due to acute kidney injury secondary to decreased appetite associated with recent COVID infection.  He was discharged home with home health PT.  Patient states he has been having this shortness of breath since last discharge from the hospital.  ED Course:  In the emergency department, he was hemodynamically stable.  Workup in the ED showed normocytic anemia.  BMP showed sodium 124, potassium 5.9, chloride 86, bicarb 27, blood glucose 121, BUN 46, creatinine 2.54, AST 340, ALT 326, ALP 628, albumin 3.4, D-dimer 6.95, urinalysis was normal CT head without contrast showed chronic encephalomalacia within the posterior left parietal lobe.  No acute intracranial abnormality Chest x-ray showed no active cardiopulmonary disease Patient was started on heparin drip due to suspected pulmonary embolism and with plan to do VQ scan in the morning.  Zofran was given IV hydration was provided.  Lokelma was given. Hospitalist was asked to admit patient for further evaluation and management.   Review of Systems: Review of systems as noted in the HPI. All other systems reviewed and are negative.   Past Medical History:   Diagnosis Date   Anemia    CVA (cerebral vascular accident) (HCC) 07/17/2013   Empyema (HCC)    Erectile dysfunction due to arterial insufficiency    Essential (primary) hypertension    Gastro-esophageal reflux disease with esophagitis    Head injury    Heart disease    Hyperlipidemia, unspecified    Hypertension    Obesity, unspecified    Pneumonia    Post-traumatic stress disorder, unspecified    Sleep apnea, unspecified    Past Surgical History:  Procedure Laterality Date   CARDIAC DEFIBRILLATOR PLACEMENT     LUNG SURGERY     operation on skin of neck     ROTATOR CUFF REPAIR Left    TENDON REPAIR     VIDEO ASSISTED THORACOSCOPY      Social History:  reports that he has quit smoking. He has never used smokeless tobacco. He reports that he does not drink alcohol and does not use drugs.   No Known Allergies  History reviewed. No pertinent family history.   Prior to Admission medications   Medication Sig Start Date End Date Taking? Authorizing Provider  acetaminophen (TYLENOL) 325 MG tablet Take 2 tablets (650 mg total) by mouth every 6 (six) hours as needed for mild pain (or Fever >/= 101). 01/01/23   Alwyn Ren, MD  albuterol (VENTOLIN HFA) 108 (90 Base) MCG/ACT inhaler Inhale 2 puffs into the lungs every 4 (four) hours as needed for wheezing or shortness of breath. 07/16/22   [provider]  apixaban (ELIQUIS) 5 MG TABS tablet Take 5 mg by mouth 2 (two) times daily.    [provider]  atorvastatin (LIPITOR) 80 MG tablet Take 40 mg by mouth daily. 06/04/22   [provider]  Carboxymethylcellulose Sod PF 0.5 % SOLN Place 1 drop into both eyes 2 (two) times daily. For dry eyes    [provider]  cetirizine (ZYRTEC) 10 MG tablet Take 10 mg by mouth at bedtime.    [provider]  Cholecalciferol 50 MCG (2000 UT) TABS Take 1 tablet by mouth every morning. 07/16/22   [provider]  clotrimazole (LOTRIMIN) 1 %  cream Apply 1 Application topically 2 (two) times daily. Apply a small amount to affected area twice daily for feet.    [provider]  cyanocobalamin (VITAMIN B12) 500 MCG tablet Take 1 tablet by mouth daily. 07/16/22   [provider]  cyclobenzaprine (FLEXERIL) 5 MG tablet Take 1 tablet (5 mg total) by mouth 3 (three) times daily as needed for muscle spasms. 01/13/23   Pickenpack-Cousar, Arty Baumgartner, NP  diclofenac Sodium (VOLTAREN) 1 % GEL Apply 2 g topically 4 (four) times daily as needed (pain). 07/16/22   [provider]  FLUoxetine (PROZAC) 20 MG capsule Take 80 mg by mouth daily. 03/03/22   [provider]  guaiFENesin-dextromethorphan (ROBITUSSIN DM) 100-10 MG/5ML syrup Take 5 mLs by mouth every 4 (four) hours as needed for cough. 12/18/22   Sloan Leiter, DO  hydrALAZINE (APRESOLINE) 50 MG tablet Take 50 mg by mouth 3 (three) times daily. 07/16/22   [provider]  KETOTIFEN FUMARATE OP Apply 1 drop to eye 2 (two) times daily as needed (allergies). 0.025%    [provider]  latanoprost (XALATAN) 0.005 % ophthalmic solution Place 1 drop into both eyes at bedtime.    [provider]  lidocaine (LIDODERM) 5 % Place 1 patch onto the skin daily. Apply 1 patch to skin once daily (Apply for  hours, then remove for  hours) 07/16/22   [provider]  losartan (COZAAR) 100 MG tablet Take 0.5 tablets (50 mg total) by mouth daily. 01/04/23   Alwyn Ren, MD  metoprolol succinate (TOPROL-XL) 50 MG 24 hr tablet Take 0.5 tablets by mouth daily. 06/04/22   [provider]  omeprazole (PRILOSEC) 20 MG capsule Take 20 mg by mouth 2 (two) times daily before a meal. Take on an empty stomach 30 minutes prior to a meal    [provider]  ondansetron (ZOFRAN) 8 MG tablet Take 1 tablet (8 mg total) by mouth every 8 (eight) hours as needed for nausea or vomiting. 01/14/23   Pickenpack-Cousar, Arty Baumgartner, NP  oxyCODONE ER  (XTAMPZA ER) 9 MG C12A Take 9 mg by mouth every 12 (twelve) hours. 01/14/23   Pickenpack-Cousar, Arty Baumgartner, NP  Oxycodone HCl 10 MG TABS Take 1 tablet (10 mg total) by mouth every 4 (four) hours as needed for up to 15 days. 01/14/23 01/29/23  Pickenpack-Cousar, Arty Baumgartner, NP  polyethylene glycol (MIRALAX / GLYCOLAX) 17 g packet Take 17 g by mouth 2 (two) times daily. 01/13/23   Pickenpack-Cousar, Arty Baumgartner, NP  Skin Protectants, Misc. (EUCERIN) cream Apply 1 Application topically 2 (two) times daily. Apply a small amount to affected area twice daily    [provider]  timolol (TIMOPTIC-XR) 0.5 % ophthalmic gel-forming Apply 1 drop to eye daily. 08/13/22   [provider]  torsemide (DEMADEX) 20 MG tablet Take 1 tablet (20 mg total) by mouth 2 (two) times daily. For Fluid 01/05/23   Alwyn Ren, MD  traZODone (DESYREL) 50 MG tablet Take 50 mg by mouth at bedtime. 02/16/13   [provider]    Physical Exam: BP 94/68   Pulse 70   Temp 97.7 F (36.5 C) (Oral)   Resp 19   Ht 5\' 7"  (1.702 m)   Wt 104 kg   SpO2 100%   BMI 35.91 kg/m   General: 77 y.o. year-old male well developed well nourished in no acute distress.  Alert and oriented x3. HEENT: NCAT, EOMI Neck: Supple, trachea medial Cardiovascular: Regular rate and rhythm with no rubs or gallops.  No thyromegaly or JVD noted.  No lower extremity edema. 2/4 pulses in all 4 extremities. Respiratory: Clear to auscultation with no wheezes or rales. Good inspiratory effort. Abdomen: Soft, nontender nondistended with normal bowel sounds x4 quadrants. Muskuloskeletal: No cyanosis, clubbing or edema noted bilaterally Neuro: CN II-XII intact, strength 5/5 x 4, sensation, reflexes intact Skin: No ulcerative lesions noted or rashes Psychiatry: Judgement and insight appear normal. Mood is appropriate for condition and setting          Labs on Admission:  Basic Metabolic Panel: Recent Labs  Lab 01/13/23 1039  01/18/23 0105 01/18/23 0652  NA 131* 124*  --   K 4.4 5.9*  --   CL 94* 86*  --   CO2 31 27  --   GLUCOSE 141* 121*  --   BUN 35* 46*  --   CREATININE 1.40* 2.54*  --   CALCIUM 9.0 8.2*  --   MG  --   --  3.4*  PHOS  --   --  4.0   Liver Function Tests: Recent Labs  Lab 01/13/23 1039 01/18/23 0105  AST 40 340*  ALT 34 326*  ALKPHOS 552* 628*  BILITOT 0.4 1.0  PROT 6.9 6.9  ALBUMIN 3.5 3.4*   Recent Labs  Lab 01/18/23 0105  LIPASE 37   No results for input(s): "AMMONIA" in the last 168 hours. CBC: Recent Labs  Lab 01/13/23 1039 01/18/23 0105  WBC 7.3 8.6  NEUTROABS 4.5 5.8  HGB 7.3* 7.0*  HCT 22.2* 22.4*  MCV 93.7 97.0  PLT 296 344   Cardiac Enzymes: No results for input(s): "CKTOTAL", "CKMB", "CKMBINDEX", "TROPONINI" in the last 168 hours.  BNP (last 3 results) Recent Labs    10/28/22 2112 11/02/22 1659 11/03/22 0943  BNP 498.0* 264.0* 386.0*    ProBNP (last 3 results) No results for input(s): "PROBNP" in the last 8760 hours.  CBG: No results for input(s): "GLUCAP" in the last 168 hours.  Radiological Exams on Admission: CT Head Wo Contrast  Result Date: 01/18/2023 CLINICAL DATA:  Mental status change, unknown cause. EXAM: CT HEAD WITHOUT CONTRAST TECHNIQUE: Contiguous axial images were obtained from the base of the skull through the vertex without intravenous contrast. RADIATION DOSE REDUCTION: This exam was performed according to the departmental dose-optimization program which includes automated exposure control, adjustment of the mA and/or kV according to patient size and/or use of iterative reconstruction technique. COMPARISON:  06/27/2013 FINDINGS: Brain: Area of old infarct and encephalomalacia within the posterior left parietal lobe, stable. No acute intracranial abnormality. Specifically, no hemorrhage, hydrocephalus, mass lesion, acute infarction, or significant intracranial injury. Vascular: No hyperdense vessel or unexpected calcification.  Skull: No acute calvarial abnormality. 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 No acute intracranial abnormality. Electronically Signed   By: Charlett Nose M.D.   On: 01/18/2023 01:55   DG Chest  2 View  Result Date: 01/17/2023 CLINICAL DATA:  Shortness of breath EXAM: CHEST - 2 VIEW COMPARISON:  Radiographs 12/18/2022 FINDINGS: Left chest wall ICD. Stable cardiomediastinal silhouette. Low lung volumes accentuate pulmonary vascularity. No focal consolidation, pleural effusion, or pneumothorax. No displaced rib fractures. Left reverse TSA. IMPRESSION: No active cardiopulmonary disease. Electronically Signed   By: Minerva Fester M.D.   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   Essential (primary) hypertension   Hyperlipidemia, unspecified   Shortness of breath   GERD without esophagitis   Elevated d-dimer   Hyperkalemia   Transaminitis  Hyponatremia Sodium 124, this is possibly secondary to multifactorial including dehydration/SIADH Continue gentle hydration Continue to monitor sodium with serial BMPs Urine osmolality, serum osmolality and urine sodium will be checked  Shortness of breath possibly secondary to multifactorial This may be due to PE or symptomatic anemia Patient continued to maintain O2 sats of 91% to 100% on room air  Elevated D-dimer rule out pulmonary embolism D-dimer was elevated at 6.95 Patient was started on IV heparin drip with plan to do VQ scan in the morning  Possible symptomatic anemia Anemia of chronic  disease Hemoglobin currently at 7.0, and this was 7.3 about 5 days ago (01/13/2023) Consider discontinuing heparin drip especially with negative VQ scan and consider blood transfusion for hemoglobin less than 7.  Hyperkalemia K+ 5.9, Lokelma was given Continue to monitor potassium level Continue telemetry  Transaminitis AST 340, ALT 326, ALP 628 Patient denies any abdominal pain at this time Abdominal ultrasound will be done Hepatitis panel pending Gastroenterology be consulted and we shall await further recommendations  Hypoalbuminemia possibly secondary to mild protein calorie malnutrition Albumin 3.4, protein supplement will be provided  GERD Continue Protonix  Essential hypertension BP meds will be temporarily held due to soft BP  Mixed hyperlipidemia Continue Lipitor  GERD Continue Protonix  Chronic back pain Continue home oxycodone   DVT prophylaxis: Heparin drip  Advance Care Planning: CODE STATUS: Full code  Consults: None  Family Communication: Wife at bedside (all questions answered to satisfaction)  Severity of Illness: The appropriate patient status for this patient is INPATIENT. Inpatient status is judged to be reasonable and necessary in order to provide the required intensity of service to ensure the patient's safety. The patient's presenting symptoms, physical exam findings, and initial radiographic and laboratory data in the context of their chronic comorbidities is felt to place them at high risk for further clinical deterioration. Furthermore, it is not anticipated that the patient will be medically stable for discharge from the hospital within 2 midnights of admission.   * I certify that at the point of admission it is my clinical judgment that the patient will require inpatient hospital care spanning beyond 2 midnights from the point of admission due to high intensity of service, high risk for further deterioration and high frequency of surveillance  required.*  Author: Frankey Shown, DO 01/18/2023 7:38 AM  For on call review [email or web address].

## 2023-01-18 NOTE — ED Notes (Signed)
Pt informed urine specimen is needed. Urinal provided.

## 2023-01-18 NOTE — ED Provider Notes (Signed)
[place] EMERGENCY DEPARTMENT AT [facility] Provider Note   CSN: [id number] Arrival date & time: 01/17/23  2204     History  Chief Complaint  Patient presents with   Multiple Complaints    Luis Donovan is a 77 y.o. male.  The history is provided by the patient and the spouse.  He has history of hypertension, hyperlipidemia, stroke, metastatic cancer with unknown primary, coronary artery disease and was diagnosed with COVID-19 on 12/18/2022 and states that he has not gotten better.  He continues to feel weak and has a cough which is nonproductive.  He denies fever, chills, sweats.  He has had some episodes of confusion.  There has been no nausea, vomiting, diarrhea.   Home Medications Prior to Admission medications   Medication Sig Start Date End Date Taking? Authorizing Provider  acetaminophen (TYLENOL) 325 MG tablet Take 2 tablets (650 mg total) by mouth every 6 (six) hours as needed for mild pain (or Fever >/= 101). 01/01/23   Alwyn Ren, MD  albuterol (VENTOLIN HFA) 108 (90 Base) MCG/ACT inhaler Inhale 2 puffs into the lungs every 4 (four) hours as needed for wheezing or shortness of breath. 07/16/22   [provider]  apixaban (ELIQUIS) 5 MG TABS tablet Take 5 mg by mouth 2 (two) times daily.    [provider]  atorvastatin (LIPITOR) 80 MG tablet Take 40 mg by mouth daily. 06/04/22   [provider]  Carboxymethylcellulose Sod PF 0.5 % SOLN Place 1 drop into both eyes 2 (two) times daily. For dry eyes    [provider]  cetirizine (ZYRTEC) 10 MG tablet Take 10 mg by mouth at bedtime.    [provider]  Cholecalciferol 50 MCG (2000 UT) TABS Take 1 tablet by mouth every morning. 07/16/22   [provider]  clotrimazole (LOTRIMIN) 1 % cream Apply 1 Application topically 2 (two) times daily. Apply a small amount to affected area twice daily for feet.    [provider]  cyanocobalamin (VITAMIN B12) 500  MCG tablet Take 1 tablet by mouth daily. 07/16/22   [provider]  cyclobenzaprine (FLEXERIL) 5 MG tablet Take 1 tablet (5 mg total) by mouth 3 (three) times daily as needed for muscle spasms. 01/13/23   Pickenpack-Cousar, Arty Baumgartner, NP  diclofenac Sodium (VOLTAREN) 1 % GEL Apply 2 g topically 4 (four) times daily as needed (pain). 07/16/22   [provider]  FLUoxetine (PROZAC) 20 MG capsule Take 80 mg by mouth daily. 03/03/22   [provider]  guaiFENesin-dextromethorphan (ROBITUSSIN DM) 100-10 MG/5ML syrup Take 5 mLs by mouth every 4 (four) hours as needed for cough. 12/18/22   Sloan Leiter, DO  hydrALAZINE (APRESOLINE) 50 MG tablet Take 50 mg by mouth 3 (three) times daily. 07/16/22   [provider]  KETOTIFEN FUMARATE OP Apply 1 drop to eye 2 (two) times daily as needed (allergies). 0.025%    [provider]  latanoprost (XALATAN) 0.005 % ophthalmic solution Place 1 drop into both eyes at bedtime.    [provider]  lidocaine (LIDODERM) 5 % Place 1 patch onto the skin daily. Apply 1 patch to skin once daily (Apply for  hours, then remove for  hours) 07/16/22   [provider]  losartan (COZAAR) 100 MG tablet Take 0.5 tablets (50 mg total) by mouth daily. 01/04/23   Alwyn Ren, MD  metoprolol succinate (TOPROL-XL) 50 MG 24 hr tablet Take 0.5 tablets by  mouth daily. 06/04/22   [provider]  omeprazole (PRILOSEC) 20 MG capsule Take 20 mg by mouth 2 (two) times daily before a meal. Take on an empty stomach 30 minutes prior to a meal    [provider]  ondansetron (ZOFRAN) 8 MG tablet Take 1 tablet (8 mg total) by mouth every 8 (eight) hours as needed for nausea or vomiting. 01/14/23   Pickenpack-Cousar, Arty Baumgartner, NP  oxyCODONE ER (XTAMPZA ER) 9 MG C12A Take 9 mg by mouth every 12 (twelve) hours. 01/14/23   Pickenpack-Cousar, Arty Baumgartner, NP  Oxycodone HCl 10 MG TABS Take 1 tablet (10 mg total) by mouth every 4  (four) hours as needed for up to 15 days. 01/14/23 01/29/23  Pickenpack-Cousar, Arty Baumgartner, NP  polyethylene glycol (MIRALAX / GLYCOLAX) 17 g packet Take 17 g by mouth 2 (two) times daily. 01/13/23   Pickenpack-Cousar, Arty Baumgartner, NP  Skin Protectants, Misc. (EUCERIN) cream Apply 1 Application topically 2 (two) times daily. Apply a small amount to affected area twice daily    [provider]  timolol (TIMOPTIC-XR) 0.5 % ophthalmic gel-forming Apply 1 drop to eye daily. 08/13/22   [provider]  torsemide (DEMADEX) 20 MG tablet Take 1 tablet (20 mg total) by mouth 2 (two) times daily. For Fluid 01/05/23   Alwyn Ren, MD  traZODone (DESYREL) 50 MG tablet Take 50 mg by mouth at bedtime. 02/16/13   [provider]      Allergies    Patient has no known allergies.    Review of Systems   Review of Systems  All other systems reviewed and are negative.   Physical Exam Updated Vital Signs BP 101/75   Pulse 72   Temp 97.9 F (36.6 C) (Oral)   Resp 13   Ht 5\' 7"  (1.702 m)   Wt 103.8 kg   SpO2 96%   BMI 35.85 kg/m  Physical Exam Vitals and nursing note reviewed.   77 year old male, resting comfortably and in no acute distress. Vital signs are normal. Oxygen saturation is 96%, which is normal. Head is normocephalic and atraumatic. PERRLA, EOMI. Oropharynx is clear. Neck is nontender and supple without adenopathy or JVD. Back is nontender and there is no CVA tenderness. Lungs are clear without rales, wheezes, or rhonchi. Chest is nontender. Heart has regular rate and rhythm without murmur. Abdomen is soft, flat, nontender. Extremities have no cyanosis or edema, full range of motion is present. Skin is warm and dry without rash. Neurologic: Mental status is normal, cranial nerves are intact, moves all extremities equally.  ED Results / Procedures / Treatments   Labs (all labs ordered are listed, but only abnormal results are displayed) Labs Reviewed   COMPREHENSIVE METABOLIC PANEL - Abnormal; Notable for the following components:      Result Value   Sodium 124 (*)    Potassium 5.9 (*)    Chloride 86 (*)    Glucose, Bld 121 (*)    BUN 46 (*)    Creatinine, Ser 2.54 (*)    Calcium 8.2 (*)    Albumin 3.4 (*)    AST 340 (*)    ALT 326 (*)    Alkaline Phosphatase 628 (*)    GFR, Estimated 26 (*)    All other components within normal limits  CBC WITH DIFFERENTIAL/PLATELET - Abnormal; Notable for the following components:   RBC 2.31 (*)    Hemoglobin 7.0 (*)    HCT 22.4 (*)  RDW 17.1 (*)    nRBC 1.3 (*)    Abs Immature Granulocytes 0.44 (*)    All other components within normal limits  URINALYSIS, ROUTINE W REFLEX MICROSCOPIC - Abnormal; Notable for the following components:   Color, Urine AMBER (*)    APPearance HAZY (*)    All other components within normal limits  D-DIMER, QUANTITATIVE - Abnormal; Notable for the following components:   D-Dimer, Quant 6.95 (*)    All other components within normal limits  LIPASE, BLOOD    EKG EKG Interpretation Date/Time:  Sunday January 17 2023 22:22:09 EDT Ventricular Rate:  70 PR Interval:    QRS Duration:  90 QT Interval:  438 QTC Calculation: 473 R Axis:   77  Text Interpretation: Sinus rhythm T wave abnormality, consider inferior ischemia Abnormal ECG When compared with ECG of 30-Dec-2022 17:14, Nonspecific T wave abnormality now evident in Anterolateral leads Confirmed by Dione Booze [id number]) on 01/18/2023 2:58:47 AM  Radiology DG Chest 2 View  Result Date: 01/17/2023 CLINICAL DATA:  Shortness of breath EXAM: CHEST - 2 VIEW COMPARISON:  Radiographs 12/18/2022 FINDINGS: Left chest wall ICD. Stable cardiomediastinal silhouette. Low lung volumes accentuate pulmonary vascularity. No focal consolidation, pleural effusion, or pneumothorax. No displaced rib fractures. Left reverse TSA. IMPRESSION: No active cardiopulmonary disease. Electronically Signed   By: Minerva Fester M.D.    On: 01/17/2023 23:27    Procedures Procedures  Cardiac monitor shows normal sinus rhythm, per my interpretation.  Medications Ordered in ED Medications  sodium chloride 0.9 % bolus 1,000 mL (has no administration in time range)  ondansetron (ZOFRAN) injection 4 mg (has no administration in time range)  sodium zirconium cyclosilicate (LOKELMA) packet 10 g (has no administration in time range)    ED Course/ Medical Decision Making/ A&P                                 Medical Decision Making Amount and/or Complexity of Data Reviewed Labs: ordered. Radiology: ordered.  Risk Prescription drug management. Decision regarding hospitalization.   Persistent weakness with shortness of breath and confusion following COVID-19 infection.  This may represent long COVID, rule out superimposed infection such as pneumonia or UTI.  Also, with dyspnea in the setting of cancer history and recent COVID infection need to rule out pulmonary embolism.  Chest x-ray shows no evidence of pneumonia.  Have independently viewed the images, and agree with radiologist interpretation.  I have ordered CT of the head may need to look for evidence of possible metastatic disease.  I have ordered laboratory workup of CBC, comprehensive metabolic panel, D-dimer.  I have also ordered a urinalysis to rule out occult UTI.  Chest x-ray shows no evidence of pneumonia.  I have independently viewed the images, and agree with the radiologist's interpretation.  I have reviewed her laboratory tests and my interpretation is normal urinalysis, moderate hyponatremia and mild hyperkalemia, acute kidney injury, elevated alkaline phosphatase slightly higher than recent values, elevated AST and ALT which is new, normal lipase, stable anemia, markedly elevated D-dimer.  Creatinine is increased from 1.40 on 8/28-2.54 today.  Sodium has dropped from 131 on 01/13/2019 24-124 today.  Potassium is 5.9 which is new for him.  Because of hyperkalemia, I  have ordered a dose of sodium zirconium cyclosilicate.  I have reviewed and interpreted his electrocardiogram and my interpretation is sinus rhythm, borderline T wave changes which are new, no ECG changes  to suggest hyperkalemia.  I have ordered IV fluids which should help both hyponatremia and renal insufficiency.  Because of elevated D-dimer I feel he needs to have study to rule out pulmonary embolism, but he is not a candidate for CT angiogram because of elevated creatinine.  I have ordered a V/q. lungs clear and and have ordered heparin pending results of lung scan.  He will need to be admitted for monitoring of his renal function and also ongoing anticoagulation if VQ scan is positive.  I have discussed case with Dr. Thomes Dinning of [facility], who agrees to admit the patient.  CRITICAL CARE Performed by: Dione Booze Total critical care time: 40 minutes Critical care time was exclusive of separately billable procedures and treating other patients. Critical care was necessary to treat or prevent imminent or life-threatening deterioration. Critical care was time spent personally by me on the following activities: development of treatment plan with patient and/or surrogate as well as nursing, discussions with consultants, evaluation of patient's response to treatment, examination of patient, obtaining history from patient or surrogate, ordering and performing treatments and interventions, ordering and review of laboratory studies, ordering and review of radiographic studies, pulse oximetry and re-evaluation of patient's condition.  Final Clinical Impression(s) / ED Diagnoses Final diagnoses:  Acute kidney injury (nontraumatic) (HCC)  Hyponatremia  Elevated d-dimer  Hyperkalemia  Elevated transaminase level  Elevated alkaline phosphatase in newborn  Normochromic normocytic anemia    Rx / DC Orders ED Discharge Orders     None         Dione Booze, MD 01/18/23 [phone]

## 2023-01-18 NOTE — Progress Notes (Signed)
ANTICOAGULATION CONSULT NOTE - Initial Consult  Pharmacy Consult for heparin Indication: pulmonary embolus  No Known Allergies  Patient Measurements: Height: 5\' 7"  (170.2 cm) Weight: 103.8 kg (228 lb 14.4 oz) IBW/kg (Calculated) : 66.1 Heparin Dosing Weight: 89 kg  Vital Signs: Temp: 97.9 F (36.6 C) (09/01 2220) Temp Source: Oral (09/01 2220) BP: 128/115 (09/02 0215) Pulse Rate: 72 (09/02 0215)  Labs: Recent Labs    01/18/23 0105  HGB 7.0*  HCT 22.4*  PLT 344  CREATININE 2.54*    Estimated Creatinine Clearance: 29.3 mL/min (A) (by C-G formula based on SCr of 2.54 mg/dL (H)).   Medical History: Past Medical History:  Diagnosis Date   Anemia    CVA (cerebral vascular accident) (HCC) 07/17/2013   Empyema (HCC)    Erectile dysfunction due to arterial insufficiency    Essential (primary) hypertension    Gastro-esophageal reflux disease with esophagitis    Head injury    Heart disease    Hyperlipidemia, unspecified    Hypertension    Obesity, unspecified    Pneumonia    Post-traumatic stress disorder, unspecified    Sleep apnea, unspecified    Assessment: 76 yoM presented to the ED with SOB, cough/fatigue/dizziness and generalized not feeling well s/p COVID infection. Patient with elevated d-dimer at 6.95 with concerns for pulmonary embolism. Pharmacy consulted to dose heparin for PE. Hgb 7 (baseline ~8-9) with no concerns for acute bleed and plts within normal limits. No PTA anticoagulation on profile.  Goal of Therapy:  Heparin level 0.3-0.7 units/ml Monitor platelets by anticoagulation protocol: Yes   Plan:  Give reduced bolus of 3000 units x 1 given reduced Hgb Start heparin infusion at 1450 units/hr Check anti-Xa level in 8 hours and daily while on heparin Continue to monitor H&H and platelets  Arabella Merles, PharmD. Clinical Pharmacist 01/18/2023 3:01 AM

## 2023-01-18 NOTE — Progress Notes (Signed)
Date and time results received: 01/18/23 1135 (use smartphrase ".now" to insert current time)  Test: HGB  Critical Value: 6.6  Name of Provider Notified: Dr Jesus Genera, patient RN made aware also  Orders Received? Or Actions Taken?:  Awaiting any new orders to be placed

## 2023-01-18 NOTE — TOC Initial Note (Signed)
Transition of Care [facility]) - Initial/Assessment Note    Patient Details  Name: Luis Donovan MRN: [id number] Date of Birth: [birth year]  Transition of Care [facility] CM/SW Contact:    Villa Herb, LCSWA Phone Number: 01/18/2023, 11:35 AM  Clinical Narrative:                 Pt is high risk for readmission. CSW spoke with pt at bedside to complete assessment. Pt states that he lives with his wife. Pt is independent in completing ADLs and drives to appointments when needed. Pt states he has not had HH and he has a cane to use when needed. [facility] updated [facility] of [facility] admission. [facility] notification ID is [id number]. TOC to follow.   Expected Discharge Plan: Home/Self Care Barriers to Discharge: Continued Medical Work up   Patient Goals and CMS Choice Patient states their goals for this hospitalization and ongoing recovery are:: return home CMS Medicare.gov Compare Post Acute Care list provided to:: Patient Choice offered to / list presented to : Patient      Expected Discharge Plan and Services In-house Referral: Clinical Social Work Discharge Planning Services: CM Consult   Living arrangements for the past 2 months: Single Family Home                                      Prior Living Arrangements/Services Living arrangements for the past 2 months: Single Family Home Lives with:: Spouse Patient language and need for interpreter reviewed:: Yes Do you feel safe going back to the place where you live?: Yes      Need for Family Participation in Patient Care: Yes (Comment) Care giver support system in place?: Yes (comment) Current home services: DME [facility]) Criminal Activity/Legal Involvement Pertinent to Current Situation/Hospitalization: No - Comment as needed  Activities of Daily Living Home Assistive Devices/Equipment: CPAP ADL Screening (condition at time of admission) Patient's cognitive ability adequate to safely complete daily activities?: Yes Is the  patient deaf or have difficulty hearing?: No Does the patient have difficulty seeing, even when wearing glasses/contacts?: No Does the patient have difficulty concentrating, remembering, or making decisions?: No Patient able to express need for assistance with ADLs?: Yes Does the patient have difficulty dressing or bathing?: No Independently performs ADLs?: Yes (appropriate for developmental age) Communication: Independent Dressing (OT): Independent Grooming: Independent Feeding: Independent Bathing: Independent Toileting: Independent In/Out Bed: Independent Walks in Home: Independent Does the patient have difficulty walking or climbing stairs?: Yes Weakness of Legs: Both Weakness of Arms/Hands: None  Permission Sought/Granted                  Emotional Assessment Appearance:: Appears stated age Attitude/Demeanor/Rapport: Engaged Affect (typically observed): Accepting Orientation: : Oriented to Self, Oriented to Place, Oriented to  Time, Oriented to Situation Alcohol / Substance Use: Not Applicable Psych Involvement: No (comment)  Admission diagnosis:  Hyperkalemia [E87.5] Hyponatremia [E87.1] Acute kidney injury (nontraumatic) (HCC) [N17.9] Elevated transaminase level [R74.01] Normochromic normocytic anemia [D64.9] Elevated d-dimer [R79.89] Elevated alkaline phosphatase in newborn [P09.8, R74.8] Patient Active Problem List   Diagnosis Date Noted   Hyponatremia 01/18/2023   Shortness of breath 01/18/2023   GERD without esophagitis 01/18/2023   Elevated d-dimer 01/18/2023   Hyperkalemia 01/18/2023   Transaminitis 01/18/2023   Medication management 01/01/2023   Palliative care encounter 12/31/2022   Goals of care, counseling/discussion 12/31/2022   Cancer associated pain 12/31/2022  Counseling and coordination of care 12/31/2022   Constipation 12/31/2022   AKI (acute kidney injury) (HCC) 12/30/2022   Presumed Metastatic cancer (HCC) 12/13/2022   MI with VFib  Arrest in 2005/CAD S/P percutaneous coronary angioplasty 12/13/2022   CKD stage 3a, GFR 45-59 ml/min (HCC) 12/13/2022   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 12/13/2022   Erectile dysfunction due to arterial insufficiency    Essential (primary) hypertension    Gastro-esophageal reflux disease with esophagitis    Hyperlipidemia, unspecified    Obesity, unspecified    Post-traumatic stress disorder, unspecified    Sleep apnea, unspecified    H/o  CVA (cerebral vascular accident) /2015 07/17/2013   PCP:  [facility] Pharmacy:   [facility] [phone] - [place], [place] - [address] [place] [address] Phone: [phone] Fax: [phone]  [facility] - [place], [place] - [address] [place] [address] Phone: [phone] Fax: [phone]     Social Determinants of Health (SDOH) Social History: SDOH Screenings   Food Insecurity: No Food Insecurity (01/18/2023)  Housing: Patient Declined (01/18/2023)  Transportation Needs: No Transportation Needs (01/18/2023)  Utilities: Not At Risk (01/18/2023)  Tobacco Use: Medium Risk (01/17/2023)   SDOH Interventions:     Readmission Risk Interventions    01/18/2023   11:34 AM 12/14/2022   10:25 AM  Readmission Risk Prevention Plan  Transportation Screening Complete Complete  HRI or Home Care Consult  Complete  Social Work Consult for Recovery Care Planning/Counseling  Complete  Palliative Care Screening  Complete  Medication Review Oceanographer) Complete Complete  HRI or Home Care Consult Complete   SW Recovery Care/Counseling Consult Complete   Palliative Care Screening Not Applicable   Skilled Nursing Facility Not Applicable

## 2023-01-18 NOTE — Progress Notes (Signed)
ANTICOAGULATION CONSULT NOTE   Pharmacy Consult for heparin Indication: pulmonary embolus  No Known Allergies  Patient Measurements: Height: 5\' 7"  (170.2 cm) Weight: 104 kg (229 lb 4.5 oz) IBW/kg (Calculated) : 66.1 Heparin Dosing Weight: 89 kg  Vital Signs: Temp: 97.7 F (36.5 C) (09/02 0730) Temp Source: Oral (09/02 0730) BP: 94/68 (09/02 0600) Pulse Rate: 70 (09/02 0600)  Labs: Recent Labs    01/18/23 0105  HGB 7.0*  HCT 22.4*  PLT 344  CREATININE 2.54*    Estimated Creatinine Clearance: 29.3 mL/min (A) (by C-G formula based on SCr of 2.54 mg/dL (H)).   Medical History: Past Medical History:  Diagnosis Date   Anemia    CVA (cerebral vascular accident) (HCC) 07/17/2013   Empyema (HCC)    Erectile dysfunction due to arterial insufficiency    Essential (primary) hypertension    Gastro-esophageal reflux disease with esophagitis    Head injury    Heart disease    Hyperlipidemia, unspecified    Hypertension    Obesity, unspecified    Pneumonia    Post-traumatic stress disorder, unspecified    Sleep apnea, unspecified    Assessment: Luis Donovan presented to the ED with SOB, cough/fatigue/dizziness and generalized not feeling well s/p COVID infection. Patient with elevated d-dimer at 6.95 with concerns for pulmonary embolism. Pharmacy consulted to dose heparin for PE.   Heparin level low on check this afternoon. Hgb down further to 6.6 (baseline ~8-9). No immediate concerns for acute bleed and plts within normal limits. No PTA anticoagulation on profile.  VQ scan shows intermediate probability of PE.   Goal of Therapy:  Heparin level 0.3-0.7 units/ml Monitor platelets by anticoagulation protocol: Yes   Plan:  Will omit rebolusing this afternoon due to low hemoglobin Increase heparin infusion to 1600 units/hr Recheck anti-Xa level in 8 hours and daily while on heparin Continue to monitor H&H and platelets  Sheppard Coil PharmD., BCPS Clinical  Pharmacist 01/18/2023 9:00 AM

## 2023-01-18 NOTE — Progress Notes (Signed)
Initial Nutrition Assessment  DOCUMENTATION CODES:   Obesity unspecified  INTERVENTION:   -Ensure Enlive po BID, each supplement provides 350 kcal and 20 grams of protein.  -MVI with minerals daily -Liberalize diet to regular for widest variety of meal selections  NUTRITION DIAGNOSIS:   Increased nutrient needs related to acute illness as evidenced by estimated needs.  GOAL:   Patient will meet greater than or equal to 90% of their needs  MONITOR:   PO intake, Supplement acceptance  REASON FOR ASSESSMENT:   Malnutrition Screening Tool    ASSESSMENT:   Pt with medical history significant of hypertension, hyperlipidemia, known metastatic disease with unknown primary, GERD, PTSD, OSA, CVA, anemia of chronic disease who presents due to shortness of breath which has been ongoing for several weeks, but worsened last couple of days.  Exertion and walking aggravates the shortness of breath, he also complained of fatigue and dizziness.  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    Pt unavailable at time of visit. Attempted to speak with pt via call to hospital room phone, however, unable to reach. RD unable to obtain further nutrition-related history or complete nutrition-focused physical exam at this time.    Per H&P, pt with recent hospital admission for AKI secondary to decreased appetite and recent COVID-19 infection (discharged on 01/01/23). Pt has been experiencing shortness of breath since hospital discharge.   Pt currently on a heart healthy diet. No meal completion data available to assess at this time.   Reviewed wt hx; wt has been stable over the past 3 months. Pt also with mild edema, which may be masking true weight loss as well as fat and muscle depletions.   Pt with increased nutritional needs and would benefit from addition of oral nutrition supplements.   Medications reviewed and include miralax.   Lab Results  Component Value Date    HGBA1C (H) 09/18/2010    6.2 (NOTE)                                                                       According to the ADA Clinical Practice Recommendations for 2011, when HbA1c is used as a screening test:   >=6.5%   Diagnostic of Diabetes Mellitus           (if abnormal result  is confirmed)  5.7-6.4%   Increased risk of developing Diabetes Mellitus  References:Diagnosis and Classification of Diabetes Mellitus,Diabetes Care,2011,34(Suppl 1):S62-S69 and Standards of Medical Care in         Diabetes - 2011,Diabetes Care,2011,34  (Suppl 1):S11-S61.   PTA DM medications are none.   Labs reviewed: Na: 124, K: 5.9, CBGS: 114 (inpatient orders for glycemic control are none).    Diet Order:   Diet Order             Diet Heart Room service appropriate? Yes; Fluid consistency: Thin  Diet effective now                   EDUCATION NEEDS:   No education needs have been identified at this time  Skin:  Skin Assessment: Reviewed RN Assessment  Last BM:  01/18/23 (type 3)  Height:   Ht Readings from Last 1 Encounters:  01/18/23 5\' 7"  (1.702 m)    Weight:   Wt Readings from Last 1 Encounters:  01/18/23 104 kg    Ideal Body Weight:  67.3 kg  BMI:  Body mass index is 35.91 kg/m.  Estimated Nutritional Needs:   Kcal:  1800-2000  Protein:  90-105 grams  Fluid:  > 1.8 L    Levada Schilling, RD, LDN, CDCES Registered Dietitian II Certified Diabetes Care and Education Specialist Please refer to [facility] for RD and/or RD on-call/weekend/after hours pager

## 2023-01-18 NOTE — Plan of Care (Signed)

## 2023-01-19 ENCOUNTER — Telehealth: Payer: Self-pay

## 2023-01-19 DIAGNOSIS — E875 Hyperkalemia: Secondary | ICD-10-CM | POA: Diagnosis not present

## 2023-01-19 DIAGNOSIS — E785 Hyperlipidemia, unspecified: Secondary | ICD-10-CM

## 2023-01-19 DIAGNOSIS — E871 Hypo-osmolality and hyponatremia: Secondary | ICD-10-CM | POA: Diagnosis not present

## 2023-01-19 DIAGNOSIS — N1832 Chronic kidney disease, stage 3b: Secondary | ICD-10-CM

## 2023-01-19 DIAGNOSIS — N179 Acute kidney failure, unspecified: Secondary | ICD-10-CM | POA: Diagnosis not present

## 2023-01-19 DIAGNOSIS — D649 Anemia, unspecified: Secondary | ICD-10-CM | POA: Diagnosis not present

## 2023-01-19 DIAGNOSIS — R748 Abnormal levels of other serum enzymes: Secondary | ICD-10-CM

## 2023-01-19 DIAGNOSIS — D631 Anemia in chronic kidney disease: Secondary | ICD-10-CM

## 2023-01-19 DIAGNOSIS — R7401 Elevation of levels of liver transaminase levels: Secondary | ICD-10-CM | POA: Diagnosis not present

## 2023-01-19 LAB — COMPREHENSIVE METABOLIC PANEL
ALT: 692 U/L — ABNORMAL HIGH (ref 0–44)
AST: 671 U/L — ABNORMAL HIGH (ref 15–41)
Albumin: 3.4 g/dL — ABNORMAL LOW (ref 3.5–5.0)
Alkaline Phosphatase: 697 U/L — ABNORMAL HIGH (ref 38–126)
Anion gap: 13 (ref 5–15)
BUN: 55 mg/dL — ABNORMAL HIGH (ref 8–23)
CO2: 24 mmol/L (ref 22–32)
Calcium: 7.8 mg/dL — ABNORMAL LOW (ref 8.9–10.3)
Chloride: 83 mmol/L — ABNORMAL LOW (ref 98–111)
Creatinine, Ser: 2.7 mg/dL — ABNORMAL HIGH (ref 0.61–1.24)
GFR, Estimated: 24 mL/min — ABNORMAL LOW (ref >60)
Glucose, Bld: 85 mg/dL (ref 70–99)
Potassium: 5.3 mmol/L — ABNORMAL HIGH (ref 3.5–5.1)
Sodium: 120 mmol/L — ABNORMAL LOW (ref 135–145)
Total Bilirubin: 1.3 mg/dL — ABNORMAL HIGH (ref 0.3–1.2)
Total Protein: 6.9 g/dL (ref 6.5–8.1)

## 2023-01-19 LAB — CBC
HCT: 21.7 % — ABNORMAL LOW (ref 39.0–52.0)
Hemoglobin: 6.9 g/dL — CL (ref 13.0–17.0)
MCH: 30.3 pg (ref 26.0–34.0)
MCHC: 31.8 g/dL (ref 30.0–36.0)
MCV: 95.2 fL (ref 80.0–100.0)
Platelets: 348 10*3/uL (ref 150–400)
RBC: 2.28 MIL/uL — ABNORMAL LOW (ref 4.22–5.81)
RDW: 17.3 % — ABNORMAL HIGH (ref 11.5–15.5)
WBC: 9.1 10*3/uL (ref 4.0–10.5)
nRBC: 2.1 % — ABNORMAL HIGH (ref 0.0–0.2)

## 2023-01-19 LAB — OSMOLALITY, URINE: Osmolality, Ur: 296 mosm/kg — ABNORMAL LOW (ref 300–900)

## 2023-01-19 LAB — PROTIME-INR
INR: 1.4 — ABNORMAL HIGH (ref 0.8–1.2)
Prothrombin Time: 17 s — ABNORMAL HIGH (ref 11.4–15.2)

## 2023-01-19 LAB — HEPARIN LEVEL (UNFRACTIONATED)
Heparin Unfractionated: 0.47 [IU]/mL (ref 0.30–0.70)
Heparin Unfractionated: 0.41 [IU]/mL (ref 0.30–0.70)

## 2023-01-19 LAB — AMMONIA: Ammonia: 20 umol/L (ref 9–35)

## 2023-01-19 LAB — PREPARE RBC (CROSSMATCH)

## 2023-01-19 LAB — LACTATE DEHYDROGENASE: LDH: 1219 U/L — ABNORMAL HIGH (ref 98–192)

## 2023-01-19 MED ORDER — PANTOPRAZOLE SODIUM 40 MG PO TBEC
40.0000 mg | DELAYED_RELEASE_TABLET | Freq: Two times a day (BID) | ORAL | Status: DC
  Administered 2023-01-19 – 2023-01-27 (×16): 40 mg via ORAL
  Filled 2023-01-19 (×16): qty 1

## 2023-01-19 MED ORDER — SODIUM CHLORIDE 0.9% IV SOLUTION: Freq: Once | INTRAVENOUS | Status: AC

## 2023-01-19 MED ORDER — ALUM & MAG HYDROXIDE-SIMETH 200-200-20 MG/5ML PO SUSP
30.0000 mL | Freq: Four times a day (QID) | ORAL | Status: DC | PRN
  Administered 2023-01-19: 30 mL via ORAL
  Filled 2023-01-19 (×2): qty 30

## 2023-01-19 NOTE — Plan of Care (Signed)

## 2023-01-19 NOTE — Progress Notes (Signed)
ANTICOAGULATION CONSULT NOTE   Pharmacy Consult for heparin Indication: pulmonary embolus  No Known Allergies  Patient Measurements: Height: 5\' 7"  (170.2 cm) Weight: 104 kg (229 lb 4.5 oz) IBW/kg (Calculated) : 66.1 Heparin Dosing Weight: 89 kg  Vital Signs: Temp: 98.2 F (36.8 C) (09/03 1500) Temp Source: Oral (09/03 1500) BP: 150/61 (09/03 1630) Pulse Rate: 78 (09/03 1630)  Labs: Recent Labs    01/18/23 0105 01/18/23 0105 01/18/23 1117 01/18/23 1653 01/18/23 2134 01/19/23 0720 01/19/23 1412 01/19/23 1559  HGB 7.0*  --  6.6*  --   --  6.9*  --   --   HCT 22.4*  --  20.9*  --   --  21.7*  --   --   PLT 344  --  347  --   --  348  --   --   LABPROT  --   --   --   --   --   --  17.0*  --   INR  --   --   --   --   --   --  1.4*  --   HEPARINUNFRC  --    < > 0.22*  --  0.12* 0.47  --  0.41  CREATININE 2.54*  --  2.66* 2.75*  --  2.70*  --   --    < > = values in this interval not displayed.    Estimated Creatinine Clearance: 27.6 mL/min (A) (by C-G formula based on SCr of 2.7 mg/dL (H)).   Medical History: Past Medical History:  Diagnosis Date   Anemia    CVA (cerebral vascular accident) (HCC) 07/17/2013   Empyema (HCC)    Erectile dysfunction due to arterial insufficiency    Essential (primary) hypertension    Gastro-esophageal reflux disease with esophagitis    Head injury    Heart disease    Hyperlipidemia, unspecified    Hypertension    Obesity, unspecified    Pneumonia    Post-traumatic stress disorder, unspecified    Sleep apnea, unspecified    Assessment: 45 yoM presented to the ED with SOB, cough/fatigue/dizziness and generalized not feeling well s/p COVID infection. Patient with elevated d-dimer at 6.95 with concerns for pulmonary embolism. No PTA anticoagulation on profile. Pharmacy consulted to dose heparin for PE.   Heparin level 0.41, therapeutic GI  to evaluate No immediate concerns for acute bleed and plts within normal limits.  Hgb 7>  6.6> 6.9, 1 unit PRBC ordered VQ scan shows intermediate probability of PE.   Goal of Therapy:  Heparin level 0.3-0.7 units/ml Monitor platelets by anticoagulation protocol: Yes   Plan:  Continue heparin infusion at 1900 units/hr Recheck anti-Xa level daily while on heparin Continue to monitor H&H and platelets  Judeth Cornfield, PharmD Clinical Pharmacist 01/19/2023 5:10 PM

## 2023-01-19 NOTE — Plan of Care (Signed)

## 2023-01-19 NOTE — Addendum Note (Signed)
Addended by: Malen Gauze on: 01/19/2023 09:19 AM   Modules accepted: Orders

## 2023-01-19 NOTE — Telephone Encounter (Signed)
-----   Message from Nurse [facility] sent at 01/15/2023  1:22 PM EDT ----- Regarding: surgery clearance  ----- Message ----- From: Ferdinand Lango, RN Sent: 01/08/2023   4:53 PM EDT To: Gustavus Messing, LPN  Surgery clearance request from [facility]

## 2023-01-19 NOTE — Telephone Encounter (Signed)
Patient's wife aware that I will reach out by the end of the week with a potential surgery date.  I do have the fax number to [facility] for surgery clearance.  Fax number is [phone]

## 2023-01-19 NOTE — Progress Notes (Signed)
Date and time results received: 01/19/23 0745 (use smartphrase ".now" to insert current time)  Test: HGB Critical Value: 6.9  Name of Provider Notified: Dr. Gwenlyn Perking  Orders Received? Or Actions Taken?: Actions Taken: Dr. Bevely Palmer aware. Orders placed for transfusion.

## 2023-01-19 NOTE — Progress Notes (Addendum)
Subjective: Reports he is feeling ok today. States he has been having low chest pain/central back pain and some RUQ/right back pain for the last few weeks. Actually states he started having pain in his left hip first several week ago. He has also been experiencing postprandial nausea and vomiting intermittently. Last episode was yesterday. Also with reflux symptoms, recently started on pantoprazole outpatient.   Had been taking about 2000 mg of tylenol daily recently. Previously taking Advil daily for years.   No brbpr or melena.   Has notices some swelling in LE. No abdominal swelling. Wife reports he has had some forgetfulness more so than normal.   Fell on Sunday at home due to legs giving out.   Objective: Vital signs in last 24 hours: Temp:  [97.6 F (36.4 C)-98.7 F (37.1 C)] 97.8 F (36.6 C) (09/03 1224) Pulse Rate:  [71-86] 78 (09/03 1300) Resp:  [14-33] 15 (09/03 1300) BP: (115-165)/(47-91) 124/91 (09/03 1300) SpO2:  [88 %-100 %] 99 % (09/03 1300) Last BM Date : 01/19/23 General:   Alert and oriented, pleasant, NAD.  Head:  Normocephalic and atraumatic. Eyes:  No icterus, sclera clear. Conjuctiva pink.  Abdomen: Bowel sounds present, full, soft, Mild TTP in epigastric and RUQ region.  No rebound or guarding. No masses appreciated  Msk:  Symmetrical without gross deformities. Normal posture. Extremities:  With trace LE edema. Neurologic:  Alert and  oriented x4;  grossly normal neurologically. Skin:  Warm and dry, intact without significant lesions.  Cervical Nodes:  No significant cervical adenopathy. Psych:  Normal mood and affect.  Intake/Output from previous day: 09/02 0701 - 09/03 0700 In: 562.7 [P.O.:240; I.V.:322.7] Out: -  Intake/Output this shift: Total I/O In: 892.1 [P.O.:360; I.V.:274.1; Blood:258] Out: 325 [Urine:325]  Lab Results: Recent Labs    01/18/23 0105 01/18/23 1117 01/19/23 0720  WBC 8.6 7.8 9.1  HGB 7.0* 6.6* 6.9*  HCT 22.4* 20.9*  21.7*  PLT 344 347 348   BMET Recent Labs    01/18/23 1117 01/18/23 1653 01/19/23 0720  NA 120* 121* 120*  K 5.5* 5.3* 5.3*  CL 85* 85* 83*  CO2 23 25 24   GLUCOSE 88 88 85  BUN 48* 51* 55*  CREATININE 2.66* 2.75* 2.70*  CALCIUM 8.1* 7.8* 7.8*   LFT Recent Labs    01/18/23 0105 01/19/23 0720  PROT 6.9 6.9  ALBUMIN 3.4* 3.4*  AST 340* 671*  ALT 326* 692*  ALKPHOS 628* 697*  BILITOT 1.0 1.3*   PT/INR Recent Labs    01/19/23 1412  LABPROT 17.0*  INR 1.4*   Hepatitis Panel Recent Labs    01/18/23 0643  HEPBSAG NON REACTIVE  HCVAB NON REACTIVE  HEPAIGM NON REACTIVE  HEPBIGM NON REACTIVE    Studies/Results: US Abdomen Limited  Result Date: 01/18/2023 CLINICAL DATA:  Nausea for 4 days, increased liver function tests. EXAM: ULTRASOUND ABDOMEN LIMITED RIGHT UPPER QUADRANT COMPARISON:  CT abdomen and pelvis dated 12/13/2022. FINDINGS: Gallbladder: Gallbladder sludge is noted. The gallbladder wall is mildly thickened, measuring 5 mm. There is pericholecystic fluid. No sonographic Murphy sign noted by sonographer. Common bile duct: Diameter: 5 mm Liver: No focal lesion identified. Within normal limits in parenchymal echogenicity. Portal vein is patent on color Doppler imaging with normal direction of blood flow towards the liver. Other: None. IMPRESSION: Gallbladder sludge with mild gallbladder wall thickening and pericholecystic fluid. Findings are suspicious for acute cholecystitis. Electronically Signed   By: Romona Curls M.D.   On: 01/18/2023  13:47   NM Pulmonary Perfusion  Result Date: 01/18/2023 CLINICAL DATA:  Pulmonary embolism (PE) suspected, low to intermediate prob, positive D-dimer. Shortness of breath EXAM: NUCLEAR MEDICINE PERFUSION LUNG SCAN TECHNIQUE: Perfusion images were obtained in multiple projections after intravenous injection of radiopharmaceutical. Ventilation scans intentionally deferred if perfusion scan and chest x-ray adequate for interpretation  during COVID 19 epidemic. RADIOPHARMACEUTICALS:  4.4 mCi Tc-32m MAA IV COMPARISON:  Chest x-ray 01/17/2023 FINDINGS: Heterogeneous radiotracer distribution within both lungs. Bilateral nonsegmental perfusion defects. IMPRESSION: Intermediate probability for pulmonary embolism. Electronically Signed   By: Duanne Guess D.O.   On: 01/18/2023 10:14   CT Head Wo Contrast  Result Date: 01/18/2023 CLINICAL DATA:  Mental status change, unknown cause. EXAM: CT HEAD WITHOUT CONTRAST TECHNIQUE: Contiguous axial images were obtained from the base of the skull through the vertex without intravenous contrast. RADIATION DOSE REDUCTION: This exam was performed according to the departmental dose-optimization program which includes automated exposure control, adjustment of the mA and/or kV according to patient size and/or use of iterative reconstruction technique. COMPARISON:  06/27/2013 FINDINGS: Brain: Area of old infarct and encephalomalacia within the posterior left parietal lobe, stable. No acute intracranial abnormality. Specifically, no hemorrhage, hydrocephalus, mass lesion, acute infarction, or significant intracranial injury. Vascular: No hyperdense vessel or unexpected calcification. Skull: No acute calvarial abnormality. 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 No acute intracranial abnormality. Electronically Signed   By: Charlett Nose M.D.   On: 01/18/2023 01:55   DG Chest 2 View  Result Date: 01/17/2023 CLINICAL DATA:  Shortness of breath EXAM: CHEST - 2 VIEW COMPARISON:  Radiographs 12/18/2022 FINDINGS: Left chest wall ICD. Stable cardiomediastinal silhouette. Low lung volumes accentuate pulmonary vascularity. No focal consolidation, pleural effusion, or pneumothorax. No displaced rib fractures. Left reverse TSA. IMPRESSION: No active cardiopulmonary disease. Electronically Signed   By: Minerva Fester M.D.   On: 01/17/2023 23:27     Assessment: 77 y.o. male with history of CVA, GERD, hypertension, hyperlipidemia, obesity, sleep apnea and metastatic disease possibly from bladder cancer,  who was admitted to the hospital after presenting worsening shortness of breath and fatigue.  Gastroenterology was consulted for evaluation of anemia and abnormal LFTs.   Elevated LFTs:  Patient has had elevated alkaline phosphatase at least since July 2024 though LFTs have been normal.  Noted acute bump in LFTs on 9/2 with AST 340, ALT 326, alk phos 628.  Today, AST 671, ALT 692, alk phos 697, total bilirubin 1.3.  Elevated alkaline phosphatase may have been driven by metastatic disease to his bones, but this would not explain AST/ALT elevation.  He has not started any hepatotoxic medications recently. He has not been found to have any focal liver lesion on ultrasound this admission.  No biliary duct dilation noted on ultrasound though he did have gallbladder sludge with mild gallbladder wall thickening and pericholecystic fluid suspicious for acute cholecystitis.  As patient reports having substernal/upper abdominal pain radiating to his back along with nausea and vomiting for the last 3 weeks, we will plan for HIDA to further evaluate possible cholecystitis/biliary etiology.  His laboratory evaluation has shown a negative acute hepatitis panel.  Autoimmune serologies are still pending.  Ferritin is elevated at 4258, saturation elevated at 57%.  This could be reactive, but we will check hemochromatosis DNA. LDH is also quite elevated at 1,219. I will check CK for rhabdomyolysis. Considering increasing LFTs, will also go ahead and check HCV RNA, HBV DNA,  CMV, EBV, HSV.   Notably, patients wife reports some increased forgetfulness lately. Will check ammonia to ensure he is not developing HE. He is A&O x 4 with no asterixis on my exam today.  Anemia:  Hgb has been steady declining since June 2024. Hgb 12.5 10/28/22 with persistent decline to 7.3 on  day of admission. Hgb dropped to 6.6 yesterday and was 6.9 this morning. He is receiving his first unit of PRBCs today. While anemia could be related to chronic disease/metastatic malignancy, his LDH is elevated at 1,219 suggesting he could be hemolyzing. He denies overt GI bleeding and had no evidence of iron deficiency, but admits to taking Aleve daily for the last several years placing him at risk for PUD. He hasn't had an EGD or colonoscopy in at least 10 years thus unable to rule out colon cancer. We have recommended EGD and colonoscopy once renal function has improved. Cr has improved minimally today and as he is receiving blood products today, we will re-evaluate tomorrow for possible prep with procedures Thursday.    Plan: Ammonia HIDA Check CK, hemochromatosis DNA, HCV RNA, HBV DNA, EBV, HSV, CMV tomorrow morning.  Agree with transfusing 1 unit PRBCs.  Continue to monitor H/H and for overt GI bleeding.  Continue PPI BID.  Monitor LFTs daily. Follow-up on pending serologies.   Additional work-up of possible hemolysis per hospitalist  EGD and colonoscopy once renal function improved.     LOS: 1 day    01/19/2023, 3:03 PM   Ermalinda Memos, [facility]

## 2023-01-19 NOTE — Progress Notes (Signed)
ANTICOAGULATION CONSULT NOTE   Pharmacy Consult for heparin Indication: pulmonary embolus  No Known Allergies  Patient Measurements: Height: 5\' 7"  (170.2 cm) Weight: 104 kg (229 lb 4.5 oz) IBW/kg (Calculated) : 66.1 Heparin Dosing Weight: 89 kg  Vital Signs: Temp: 98.7 F (37.1 C) (09/03 0400) Temp Source: Oral (09/03 0400) BP: 148/60 (09/03 0600) Pulse Rate: 79 (09/03 0600)  Labs: Recent Labs    01/18/23 0105 01/18/23 1117 01/18/23 1653 01/18/23 2134 01/19/23 0720  HGB 7.0* 6.6*  --   --  6.9*  HCT 22.4* 20.9*  --   --  21.7*  PLT 344 347  --   --  348  HEPARINUNFRC  --  0.22*  --  0.12* 0.47  CREATININE 2.54* 2.66* 2.75*  --  2.70*    Estimated Creatinine Clearance: 27.6 mL/min (A) (by C-G formula based on SCr of 2.7 mg/dL (H)).   Medical History: Past Medical History:  Diagnosis Date   Anemia    CVA (cerebral vascular accident) (HCC) 07/17/2013   Empyema (HCC)    Erectile dysfunction due to arterial insufficiency    Essential (primary) hypertension    Gastro-esophageal reflux disease with esophagitis    Head injury    Heart disease    Hyperlipidemia, unspecified    Hypertension    Obesity, unspecified    Pneumonia    Post-traumatic stress disorder, unspecified    Sleep apnea, unspecified    Assessment: 71 yoM presented to the ED with SOB, cough/fatigue/dizziness and generalized not feeling well s/p COVID infection. Patient with elevated d-dimer at 6.95 with concerns for pulmonary embolism. No PTA anticoagulation on profile. Pharmacy consulted to dose heparin for PE.   Heparin level 0.47, therapeutic GI  to evaluate No immediate concerns for acute bleed and plts within normal limits.  Hgb 7> 6.6> 6.9, MD aware VQ scan shows intermediate probability of PE.   Goal of Therapy:  Heparin level 0.3-0.7 units/ml Monitor platelets by anticoagulation protocol: Yes   Plan:  Continue heparin infusion at 1900 units/hr Recheck anti-Xa level in 8 hours for  confirmatory and daily while on heparin Continue to monitor H&H and platelets  Elder Cyphers, BS Pharm D, BCPS Clinical Pharmacist 01/19/2023 8:15 AM

## 2023-01-19 NOTE — Progress Notes (Signed)
Progress Note   Patient: Luis Donovan IEP:[id number] DOB: 15-Jun-1945 DOA: 01/17/2023     1 DOS: the patient was seen and examined on 01/19/2023   Brief hospital admission narrative: As per H&P written by Dr. Thomes Dinning on 01/18/2023 Luis Donovan is a 77 y.o. male with medical history significant of hypertension, hyperlipidemia, known metastatic disease with unknown primary, GERD, PTSD, OSA, CVA, anemia of chronic disease who presents to the emergency department accompanied by wife due to shortness of breath which has been ongoing for several weeks, but worsened last couple of days.  Exertion and walking aggravates the shortness of breath, he also complained of fatigue and dizziness. Patient was admitted from 8/14 to 8/16 at [facility] long due to acute kidney injury secondary to decreased appetite associated with recent COVID infection.  He was discharged home with home health PT.  Patient states he has been having this shortness of breath since last discharge from the hospital.   ED Course:  In the emergency department, he was hemodynamically stable.  Workup in the ED showed normocytic anemia.  BMP showed sodium 124, potassium 5.9, chloride 86, bicarb 27, blood glucose 121, BUN 46, creatinine 2.54, AST 340, ALT 326, ALP 628, albumin 3.4, D-dimer 6.95, urinalysis was normal CT head without contrast showed chronic encephalomalacia within the posterior left parietal lobe.  No acute intracranial abnormality Chest x-ray showed no active cardiopulmonary disease Patient was started on heparin drip due to suspected pulmonary embolism and with plan to do VQ scan in the morning.  Zofran was given IV hydration was provided.  Lokelma was given. Hospitalist was asked to admit patient for further evaluation and management.  Assessment and Plan: 1-hyponatremia -Multifactorial, with concerns for SIADH age and dehydration. -Gentle fluid provided without much improvement. -Hemodynamically stable at the moment; will  hold fluids and follow electrolytes trend. -Serum osmolality was normal; patient urine osmolality 296. -Continue supportive care.  2-symptomatic anemia -Hemoglobin 6.9 patient reporting feeling weak and tired -No tachycardia, stable blood pressure -No shortness of breath. -1 unit PRBC will be provided and will follow hemoglobin trend -No overt bleeding appreciated.  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 -Creatinine currently 2.70 -Patient advised to maintain adequate oral hydration -Continue to follow renal function trend.  5-hyperkalemia -Improved after Lokelma given -No acute changes appreciated on telemetry -Continue to follow electrolytes trend.  6-transaminitis -Worsening -Will follow GI service recommendation -With the need for anticoagulation and the presence of worsening anemia patient will benefit of endoscopic evaluation when medically stable.  7-GERD -Continue PPI.  8-malignancy -continue outpatient follow up with oncology and urology -per family has been approved by [facility] for PET Scan  9-generalized chronic pain -continue home analgesic regimen and further adjusted as needed.  Subjective:  Afebrile, no nausea or vomiting; reports no overt bleeding.  Patient expressing generalized weakness and indigestion symptoms.  Good saturation on room air.  Physical Exam: Vitals:   01/19/23 1130 01/19/23 1224 01/19/23 1230 01/19/23 1300  BP: (!) 139/58 (!) 165/55 136/74 (!) 124/91  Pulse: 79 78 76 78  Resp: 20 18 16 15   Temp:  97.8 F (36.6 C)    TempSrc:  Oral    SpO2: 99%  94% 100% 99%  Weight:      Height:        General exam: Alert, awake, oriented x 3; obese, generally weak and in no major distress.  Reporting indigestion symptoms and feeling short winded.  Afebrile. Respiratory system: Good air bilaterally, no requiring oxygen supplementation and using accessory muscles. Cardiovascular system:RRR. No rubs or gallops; unable to properly assess JVD with body habitus. Gastrointestinal system: Abdomen is obese, nondistended, soft and nontender. No organomegaly or masses felt. Normal bowel sounds heard. Central nervous system: No focal neurological deficits. Extremities: No cyanosis or clubbing.  Trace lower extremity edema appreciated. Skin: No petechiae. Psychiatry: Judgement and insight appear normal. Mood & affect appropriate.   Data Reviewed: CBC: White blood cells 9.1, hemoglobin 6.9, MCV 95.2 platelet count 348K Comprehensive metabolic panel: Sodium 120, potassium 5.3, chloride 83, bicarb 24, glucose 85, BUN 55, creatinine 2.70 AST 671, ALT 692, alk phos 697 and GFR 24.  Family Communication: Significant other at bedside.  Disposition: Status is: Inpatient Remains inpatient appropriate because: Continue treatment with heparin drip, follow GI service recommendation for further evaluation and management of his transaminitis and concern for underlying bleeding in GI tract.  Follow hemoglobin trend and transfuse as needed.   Planned Discharge Destination: Home  CRITICAL CARE Performed by: Vassie Loll   Total critical care time: 60 minutes  Critical care time was exclusive of separately billable procedures and treating other patients.  Critical care was necessary to treat or prevent imminent or life-threatening deterioration.  Critical care was time spent personally by me on the following activities: development of treatment plan with patient and/or surrogate as well as nursing, discussions with consultants, evaluation of patient's response to treatment, examination of patient, obtaining  history from patient or surrogate, ordering and performing treatments and interventions, ordering and review of laboratory studies, ordering and review of radiographic studies, pulse oximetry and re-evaluation of patient's condition.   Author: Vassie Loll, MD 01/19/2023 2:46 PM  For on call review [email or web address].

## 2023-01-20 ENCOUNTER — Inpatient Hospital Stay (HOSPITAL_COMMUNITY): Payer: No Typology Code available for payment source

## 2023-01-20 ENCOUNTER — Other Ambulatory Visit: Payer: Self-pay

## 2023-01-20 ENCOUNTER — Encounter (HOSPITAL_COMMUNITY): Payer: Self-pay | Admitting: Internal Medicine

## 2023-01-20 DIAGNOSIS — I4891 Unspecified atrial fibrillation: Secondary | ICD-10-CM

## 2023-01-20 DIAGNOSIS — C7951 Secondary malignant neoplasm of bone: Secondary | ICD-10-CM | POA: Diagnosis not present

## 2023-01-20 DIAGNOSIS — E785 Hyperlipidemia, unspecified: Secondary | ICD-10-CM | POA: Diagnosis not present

## 2023-01-20 DIAGNOSIS — K219 Gastro-esophageal reflux disease without esophagitis: Secondary | ICD-10-CM | POA: Diagnosis not present

## 2023-01-20 DIAGNOSIS — R778 Other specified abnormalities of plasma proteins: Secondary | ICD-10-CM

## 2023-01-20 DIAGNOSIS — Z515 Encounter for palliative care: Secondary | ICD-10-CM | POA: Diagnosis not present

## 2023-01-20 DIAGNOSIS — Z7189 Other specified counseling: Secondary | ICD-10-CM

## 2023-01-20 DIAGNOSIS — E871 Hypo-osmolality and hyponatremia: Secondary | ICD-10-CM | POA: Diagnosis not present

## 2023-01-20 DIAGNOSIS — N179 Acute kidney failure, unspecified: Secondary | ICD-10-CM | POA: Diagnosis not present

## 2023-01-20 LAB — MAGNESIUM: Magnesium: 3.6 mg/dL — ABNORMAL HIGH (ref 1.7–2.4)

## 2023-01-20 LAB — COMPREHENSIVE METABOLIC PANEL
ALT: 552 U/L — ABNORMAL HIGH (ref 0–44)
AST: 331 U/L — ABNORMAL HIGH (ref 15–41)
Albumin: 3.2 g/dL — ABNORMAL LOW (ref 3.5–5.0)
Alkaline Phosphatase: 671 U/L — ABNORMAL HIGH (ref 38–126)
Anion gap: 11 (ref 5–15)
BUN: 56 mg/dL — ABNORMAL HIGH (ref 8–23)
CO2: 25 mmol/L (ref 22–32)
Calcium: 7.9 mg/dL — ABNORMAL LOW (ref 8.9–10.3)
Chloride: 85 mmol/L — ABNORMAL LOW (ref 98–111)
Creatinine, Ser: 2.16 mg/dL — ABNORMAL HIGH (ref 0.61–1.24)
GFR, Estimated: 31 mL/min — ABNORMAL LOW (ref >60)
Glucose, Bld: 92 mg/dL (ref 70–99)
Potassium: 4.4 mmol/L (ref 3.5–5.1)
Sodium: 121 mmol/L — ABNORMAL LOW (ref 135–145)
Total Bilirubin: 1.4 mg/dL — ABNORMAL HIGH (ref 0.3–1.2)
Total Protein: 6.5 g/dL (ref 6.5–8.1)

## 2023-01-20 LAB — CBC
HCT: 23.8 % — ABNORMAL LOW (ref 39.0–52.0)
Hemoglobin: 7.5 g/dL — ABNORMAL LOW (ref 13.0–17.0)
MCH: 30.1 pg (ref 26.0–34.0)
MCHC: 31.5 g/dL (ref 30.0–36.0)
MCV: 95.6 fL (ref 80.0–100.0)
Platelets: 252 10*3/uL (ref 150–400)
RBC: 2.49 MIL/uL — ABNORMAL LOW (ref 4.22–5.81)
RDW: 16.4 % — ABNORMAL HIGH (ref 11.5–15.5)
WBC: 6.6 10*3/uL (ref 4.0–10.5)
nRBC: 1.8 % — ABNORMAL HIGH (ref 0.0–0.2)

## 2023-01-20 LAB — TROPONIN I (HIGH SENSITIVITY)
Troponin I (High Sensitivity): 987 ng/L (ref <18)
Troponin I (High Sensitivity): 992 ng/L (ref <18)

## 2023-01-20 LAB — TYPE AND SCREEN
ABO/RH(D): A POS
Antibody Screen: NEGATIVE
Unit division: 0

## 2023-01-20 LAB — BPAM RBC
Blood Product Expiration Date: 202409282359
ISSUE DATE / TIME: 202409031008
Unit Type and Rh: 6200

## 2023-01-20 LAB — IGG: IgG (Immunoglobin G), Serum: 874 mg/dL (ref 603–1613)

## 2023-01-20 LAB — HEPARIN LEVEL (UNFRACTIONATED): Heparin Unfractionated: 0.44 [IU]/mL (ref 0.30–0.70)

## 2023-01-20 LAB — CK: Total CK: 296 U/L (ref 49–397)

## 2023-01-20 LAB — ANTI-SMOOTH MUSCLE ANTIBODY, IGG: F-Actin IgG: 4 U (ref 0–19)

## 2023-01-20 LAB — MITOCHONDRIAL ANTIBODIES: Mitochondrial M2 Ab, IgG: <20 U (ref 0.0–20.0)

## 2023-01-20 MED ORDER — MORPHINE SULFATE (PF) 2 MG/ML IV SOLN
2.0000 mg | INTRAVENOUS | Status: DC | PRN
  Administered 2023-01-20 – 2023-01-22 (×3): 2 mg via INTRAVENOUS
  Filled 2023-01-20 (×3): qty 1

## 2023-01-20 MED ORDER — NITROGLYCERIN 0.4 MG SL SUBL
0.4000 mg | SUBLINGUAL_TABLET | SUBLINGUAL | Status: DC | PRN
  Administered 2023-01-20 (×2): 0.4 mg via SUBLINGUAL

## 2023-01-20 MED ORDER — STERILE WATER FOR INJECTION IJ SOLN
INTRAMUSCULAR | Status: AC
  Filled 2023-01-20: qty 10

## 2023-01-20 MED ORDER — ALBUTEROL SULFATE (2.5 MG/3ML) 0.083% IN NEBU
2.5000 mg | INHALATION_SOLUTION | RESPIRATORY_TRACT | Status: DC | PRN
  Administered 2023-01-26: 2.5 mg via RESPIRATORY_TRACT
  Filled 2023-01-20: qty 3

## 2023-01-20 MED ORDER — NITROGLYCERIN 0.4 MG SL SUBL
SUBLINGUAL_TABLET | SUBLINGUAL | Status: AC
  Filled 2023-01-20: qty 1

## 2023-01-20 MED ORDER — SINCALIDE 5 MCG IJ SOLR
INTRAMUSCULAR | Status: AC
  Filled 2023-01-20: qty 5

## 2023-01-20 MED ORDER — SINCALIDE 5 MCG IJ SOLR
0.0200 ug/kg | Freq: Once | INTRAMUSCULAR | Status: AC
  Administered 2023-01-20: 2.18 ug via INTRAVENOUS

## 2023-01-20 MED ORDER — FUROSEMIDE 10 MG/ML IJ SOLN
20.0000 mg | Freq: Once | INTRAMUSCULAR | Status: AC
  Administered 2023-01-20: 20 mg via INTRAVENOUS
  Filled 2023-01-20: qty 2

## 2023-01-20 MED ORDER — NITROGLYCERIN 0.4 MG SL SUBL
0.4000 mg | SUBLINGUAL_TABLET | SUBLINGUAL | Status: DC | PRN
Start: 2023-01-20 — End: 2023-01-20

## 2023-01-20 MED ORDER — STERILE WATER FOR INJECTION IJ SOLN
2.1800 mL | Freq: Once | INTRAMUSCULAR | Status: AC
  Administered 2023-01-20: 2.18 mL via INTRAMUSCULAR

## 2023-01-20 MED ORDER — METOPROLOL TARTRATE 25 MG PO TABS
25.0000 mg | ORAL_TABLET | Freq: Four times a day (QID) | ORAL | Status: AC
  Administered 2023-01-20 – 2023-01-22 (×8): 25 mg via ORAL
  Filled 2023-01-20 (×10): qty 1

## 2023-01-20 MED ORDER — LEVALBUTEROL HCL 0.63 MG/3ML IN NEBU
0.6300 mg | INHALATION_SOLUTION | Freq: Three times a day (TID) | RESPIRATORY_TRACT | Status: DC | PRN
  Administered 2023-01-20 – 2023-01-22 (×3): 0.63 mg via RESPIRATORY_TRACT
  Filled 2023-01-20 (×4): qty 3

## 2023-01-20 MED ORDER — DILTIAZEM HCL 25 MG/5ML IV SOLN
5.0000 mg | Freq: Once | INTRAVENOUS | Status: AC
  Administered 2023-01-20: 5 mg via INTRAVENOUS
  Filled 2023-01-20: qty 5

## 2023-01-20 MED ORDER — TECHNETIUM TC 99M MEBROFENIN IV KIT
5.0000 | PACK | Freq: Once | INTRAVENOUS | Status: AC | PRN
  Administered 2023-01-20: 5.2 via INTRAVENOUS

## 2023-01-20 MED ORDER — METOPROLOL TARTRATE 5 MG/5ML IV SOLN
2.5000 mg | Freq: Three times a day (TID) | INTRAVENOUS | Status: DC
  Administered 2023-01-20: 2.5 mg via INTRAVENOUS
  Filled 2023-01-20: qty 5

## 2023-01-20 MED ORDER — ALUM & MAG HYDROXIDE-SIMETH 200-200-20 MG/5ML PO SUSP
30.0000 mL | ORAL | Status: DC | PRN
  Administered 2023-01-20: 30 mL via ORAL

## 2023-01-20 NOTE — Progress Notes (Signed)
ANTICOAGULATION CONSULT NOTE   Pharmacy Consult for heparin Indication: pulmonary embolus  No Known Allergies  Patient Measurements: Height: 5\' 7"  (170.2 cm) Weight: 104 kg (229 lb 4.5 oz) IBW/kg (Calculated) : 66.1 Heparin Dosing Weight: 89 kg  Vital Signs: Temp: 97.8 F (36.6 C) (09/04 0400) Temp Source: Oral (09/04 0400) BP: 166/68 (09/04 0615) Pulse Rate: 101 (09/04 0615)  Labs: Recent Labs    01/18/23 1117 01/18/23 1653 01/18/23 2134 01/19/23 0720 01/19/23 1412 01/19/23 1559 01/20/23 0510  HGB 6.6*  --   --  6.9*  --   --  7.5*  HCT 20.9*  --   --  21.7*  --   --  23.8*  PLT 347  --   --  348  --   --  252  LABPROT  --   --   --   --  17.0*  --   --   INR  --   --   --   --  1.4*  --   --   HEPARINUNFRC 0.22*  --    < > 0.47  --  0.41 0.44  CREATININE 2.66* 2.75*  --  2.70*  --   --   --   CKTOTAL  --   --   --   --   --   --  296  TROPONINIHS  --   --   --   --   --   --  987*   < > = values in this interval not displayed.    Estimated Creatinine Clearance: 27.6 mL/min (A) (by C-G formula based on SCr of 2.7 mg/dL (H)).   Medical History: Past Medical History:  Diagnosis Date   Anemia    CVA (cerebral vascular accident) (HCC) 07/17/2013   Empyema (HCC)    Erectile dysfunction due to arterial insufficiency    Essential (primary) hypertension    Gastro-esophageal reflux disease with esophagitis    Head injury    Heart disease    Hyperlipidemia, unspecified    Hypertension    Obesity, unspecified    Pneumonia    Post-traumatic stress disorder, unspecified    Sleep apnea, unspecified    Assessment: 48 yoM presented to the ED with SOB, cough/fatigue/dizziness and generalized not feeling well s/p COVID infection. Patient with elevated d-dimer at 6.95 with concerns for pulmonary embolism. No PTA anticoagulation on profile. Pharmacy consulted to dose heparin for PE.   Heparin level 0.44, therapeutic No immediate concerns for acute bleed and plts within  normal limits.  Hgb 7> 6.6> 6.9> 7.5 VQ scan shows intermediate probability of PE.   Goal of Therapy:  Heparin level 0.3-0.7 units/ml Monitor platelets by anticoagulation protocol: Yes   Plan:  Continue heparin infusion at 1900 units/hr Recheck anti-Xa level daily while on heparin Continue to monitor H&H and platelets F/U transition to po DOAC  Elder Cyphers, BS Loura Back, BCPS Clinical Pharmacist Pager [phone]  01/20/2023 7:27 AM

## 2023-01-20 NOTE — Consult Note (Signed)
Consultation Note Date: 01/20/2023   Patient Name: Luis Donovan  DOB: 06/29/1945  MRN: [id number]  Age / Sex: 77 y.o., male  PCP: [facility] Referring Physician: Vassie Loll, MD  Reason for Consultation: Establishing goals of care  HPI/Patient Profile: 77 y.o. 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 Being treated for atrial fibrillation, AKI on CKD, hyponatremia/SIADH, probable PE, transaminitis.    Clinical Assessment and Goals of Care: Consult received and chart review completed of current hospital attending/consultant notes, labs, and diagnostics. Discussed with nursing staff. Noted multiple previous palliative visits at [facility] and [facility]. I met today with Luis Donovan and wife, Meriam Sprague, at bedside. Artrell has just returned from HIDA scan. He is sitting on side of bed and eating lunch. He endorses hunger and good appetite. He reports that he is feeling much better and breathing is much improved. We discussed his complicated health issues in the setting of newly discovered malignancy. [facility] have been [facility] with [facility] to get [facility] for ongoing work up with PET scan and biopsy to develop treatment plan. They are in the process of scheduling PET scan and are hopeful to have a date for surgery for bladder neck tumor biopsy vs resection by the end of the week. We did discuss obstacles of renal failure and blood thinners with concern for blood clot and how this may effect timing of surgical procedures.   Their main goal at this time is to treat to get him to a place to receive ongoing work up and treatment of his cancer. They  tell me that they continue to be hopeful. They understand his situation is complicated but they are hopeful for improvement. His pain is well controlled with current regimen. They are struggling with [facility] to approve him for treatment locally as they want him to go back and forth to [facility] for treatment but Yassine's wife does not drive. His daughter, Patsy Lager, is very supportive and she lives here in [place] but works in [place]. I did speak with them about the importance of Advance Directives and having the difficult conversations earlier than later. I provided them with 2 copies of Advance Directives and encouraged that it is just as important for Meriam Sprague to discuss her wishes as it is for Luis Donovan. They understand the importance and are accepting of documents at this time.   All questions/concerns addressed. Emotional support provided.    Primary Decision Maker PATIENT    SUMMARY OF RECOMMENDATIONS   - Hopeful for improvement and to continue work up and treatment of recently discovered cancer - Recommend close follow up with my colleague Lowella Bandy Jake Samples) Valerie Roys, NP at [facility]  Code Status/Advance Care Planning: Full code   Symptom Management:  Per attending, GI, cardiology  Prognosis:  Overall prognosis guarded  due to multiple complicating co-morbidities.   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 The following aspects are pertinent.  Past Medical History:  Diagnosis Date   Anemia    CVA (cerebral vascular accident) (HCC) 07/17/2013   Empyema (HCC)    Erectile dysfunction due to arterial insufficiency    Essential (primary) hypertension    Gastro-esophageal reflux disease with esophagitis    Head  injury    Heart disease    Hyperlipidemia, unspecified    Hypertension    Obesity, unspecified    Pneumonia    Post-traumatic stress disorder, unspecified    Sleep apnea, unspecified    Social History   Socioeconomic History   Marital status: Married    Spouse name: beverly   Number of children: 4   Years of education: Not on file   Highest education level: Not on file  Occupational History   Occupation: retired    Associate Professor: RETIRED  Tobacco Use   Smoking status: Former   Smokeless tobacco: Never  Advertising account planner   Vaping status: Never Used  Substance and Sexual Activity   Alcohol use: No   Drug use: No   Sexual activity: Never    Birth control/protection: None  Other Topics Concern   Not on file  Social History Narrative   Not on file   Social Determinants of Health   Financial Resource Strain: Not on file  Food Insecurity: No Food Insecurity (01/18/2023)   Hunger Vital Sign    Worried About Running Out of Food in the Last Year: Never true    Ran Out of Food in the Last Year: Never true  Transportation Needs: No Transportation Needs (01/18/2023)   PRAPARE - Administrator, Civil Service (Medical): No    Lack of Transportation (Non-Medical): No  Physical Activity: Not on file  Stress: Not on file  Social Connections: Not on file   History reviewed. No pertinent family history. Scheduled Meds:  Chlorhexidine Gluconate Cloth  6 each Topical Q0600   feeding supplement  237 mL Oral BID BM   metoprolol tartrate  25 mg Oral Q6H   multivitamin with minerals  1 tablet Oral Daily   oxyCODONE  10 mg Oral Q12H   pantoprazole  40 mg Oral BID   polyethylene glycol  17 g Oral BID   traZODone  50 mg Oral QHS   Continuous Infusions:  heparin 1,900 Units/hr (01/20/23 0850)   PRN Meds:.acetaminophen **OR** acetaminophen, alum & mag hydroxide-simeth, alum & mag hydroxide-simeth, morphine injection, nitroGLYCERIN, oxyCODONE, prochlorperazine No Known Allergies Review  of Systems  Constitutional:  Positive for activity change.  Respiratory:  Positive for shortness of breath.   Neurological:  Positive for weakness.  Psychiatric/Behavioral:         Memory impairment    Physical Exam Vitals and nursing note reviewed.  Constitutional:      General: He is not in acute distress.    Appearance: He is obese. He is ill-appearing.  Cardiovascular:     Rate and Rhythm: Normal rate. Rhythm irregularly irregular.  Pulmonary:     Effort: No tachypnea, accessory muscle usage or respiratory distress.     Comments: Able to sit on side of bed and change position independently without distress  with oxygen Abdominal:     Palpations: Abdomen is soft.  Neurological:     Mental Status: He is alert and oriented to person, place, and time.     Vital Signs: BP (!) 158/66   Pulse 75   Temp 98.1 F (36.7 C) (Oral)   Resp 19   Ht 5\' 7"  (1.702 m)   Wt 104 kg   SpO2 100%   BMI 35.91 kg/m  Pain Scale: 0-10   Pain Score: 6    SpO2: SpO2: 100 % O2 Device:SpO2: 100 % O2 Flow Rate: .O2 Flow Rate (L/min): 2 L/min  IO: Intake/output summary:  Intake/Output Summary (Last 24 hours) at 01/20/2023 1152 Last data filed at 01/20/2023 0850 Gross per 24 hour  Intake 1458.13 ml  Output 700 ml  Net 758.13 ml    LBM: Last BM Date : 01/19/23 Baseline Weight: Weight: 103.8 kg Most recent weight: Weight: 104 kg     Palliative Assessment/Data:    Time Total: 75 min  Greater than 50%  of this time was spent counseling and coordinating care related to the above assessment and plan.  Signed by: Yong Channel, NP Palliative Medicine Team Pager # [phone] (M-F 8a-5p) Team Phone # [phone] (Nights/Weekends)

## 2023-01-20 NOTE — Progress Notes (Signed)
Cardiology records requested from [facility]. Received fax of records. Dr. Wyline Mood made aware.

## 2023-01-20 NOTE — Consult Note (Signed)
Cardiology Consultation   Patient ID: JANDIEL INGA MRN: [id number]; DOB: 1945/12/07  Admit date: 01/17/2023 Date of Consult: 01/20/2023  PCP:  [facility] Providers Cardiologist:  [facility]}     Patient Profile:   Luis Donovan is a 77 y.o. male with a hx of HTN, CAD with prior MI and PCI in 2005, HLD, metastatic cancer unknown primary, OSA, prior CVA, anemia of chronic disease, recent admission 12/2022 with covid, who is being seen 01/20/2023 for the evaluation of afib at the request of Dr Gwenlyn Perking.  History of Present Illness:   Luis Donovan 77 yo male history of HTN, CAD with prior MI and PCI in 2005, HLD, metastatic cancer unknown primary, OSA, prior CVA, anemia of chronic disease, recent admission 12/2022 with covid,  admitted with SOB x several weeks. Admitted with possible PE based on intermediate risk VQ scan, as well as ongoing workup for hyponatremia, AKI on CKD, elevated LFTs. During admission developed afib with RVR, cardiology consulted to help managem.    K 5.9 Na 124 BUN 46 Cr 2.54 WBC 8.6 Hgb 7 Plt 344 Ddimer 6.95 CK 296  Trop 987-->992 EKG admit NSR, no ischemic changes F/u EKG 9/4 afib with RVR  CXR no acute process CT head; no acute process VQ scan: intermediate probability PE  Past Medical History:  Diagnosis Date   Anemia    CVA (cerebral vascular accident) (HCC) 07/17/2013   Empyema (HCC)    Erectile dysfunction due to arterial insufficiency    Essential (primary) hypertension    Gastro-esophageal reflux disease with esophagitis    Head injury    Heart disease    Hyperlipidemia, unspecified    Hypertension    Obesity, unspecified    Pneumonia    Post-traumatic stress disorder, unspecified    Sleep apnea, unspecified     Past Surgical History:  Procedure Laterality Date   CARDIAC DEFIBRILLATOR PLACEMENT     LUNG SURGERY     operation on skin of neck     ROTATOR CUFF REPAIR Left    TENDON REPAIR      VIDEO ASSISTED THORACOSCOPY         Inpatient Medications: Scheduled Meds:  Chlorhexidine Gluconate Cloth  6 each Topical Q0600   feeding supplement  237 mL Oral BID BM   metoprolol tartrate  2.5 mg Intravenous Q8H   multivitamin with minerals  1 tablet Oral Daily   oxyCODONE  10 mg Oral Q12H   pantoprazole  40 mg Oral BID   polyethylene glycol  17 g Oral BID   traZODone  50 mg Oral QHS   Continuous Infusions:  heparin 1,900 Units/hr (01/20/23 0850)   PRN Meds: acetaminophen **OR** acetaminophen, alum & mag hydroxide-simeth, alum & mag hydroxide-simeth, morphine injection, nitroGLYCERIN, oxyCODONE, prochlorperazine  Allergies:   No Known Allergies  Social History:   Social History   Socioeconomic History   Marital status: Married    Spouse name: beverly   Number of children: 4   Years of education: Not on file   Highest education level: Not on file  Occupational History   Occupation: retired    Associate Professor: RETIRED  Tobacco Use   Smoking status: Former   Smokeless tobacco: Never  Advertising account planner   Vaping status: Never Used  Substance and Sexual Activity   Alcohol use: No   Drug use: No   Sexual activity: Never    Birth control/protection: None  Other Topics Concern  Not on file  Social History Narrative   Not on file   Social Determinants of Health   Financial Resource Strain: Not on file  Food Insecurity: No Food Insecurity (01/18/2023)   Hunger Vital Sign    Worried About Running Out of Food in the Last Year: Never true    Ran Out of Food in the Last Year: Never true  Transportation Needs: No Transportation Needs (01/18/2023)   PRAPARE - Administrator, Civil Service (Medical): No    Lack of Transportation (Non-Medical): No  Physical Activity: Not on file  Stress: Not on file  Social Connections: Not on file  Intimate Partner Violence: Not At Risk (01/18/2023)   Humiliation, Afraid, Rape, and Kick questionnaire    Fear of Current or Ex-Partner:  No    Emotionally Abused: No    Physically Abused: No    Sexually Abused: No    Family History:   History reviewed. No pertinent family history.   ROS:  Please see the history of present illness.   All other ROS reviewed and negative.     Physical Exam/Data:   Vitals:   01/20/23 0735 01/20/23 0738 01/20/23 0800 01/20/23 0830  BP:   (!) 161/78 133/69  Pulse:   (!) 128   Resp:   (!) 21 20  Temp:  98.1 F (36.7 C)    TempSrc:  Oral    SpO2: 98%  100% 100%  Weight:      Height:        Intake/Output Summary (Last 24 hours) at 01/20/2023 0924 Last data filed at 01/20/2023 0850 Gross per 24 hour  Intake 1879.93 ml  Output 625 ml  Net 1254.93 ml      01/18/2023    4:00 AM 01/17/2023   10:16 PM 01/13/2023    9:47 AM  Last 3 Weights  Weight (lbs) 229 lb 4.5 oz 228 lb 14.4 oz 228 lb 14.4 oz  Weight (kg) 104 kg 103.828 kg 103.828 kg     Body mass index is 35.91 kg/m.  General:  Well nourished, well developed, in no acute distress HEENT: normal Neck: no JVD Vascular: No carotid bruits; Distal pulses 2+ bilaterally Cardiac:  irreg, no m/rg, Lungs:  clear to auscultation bilaterally, no wheezing, rhonchi or rales  Abd: soft, nontender, no hepatomegaly  Ext: trace bilateral edema Musculoskeletal:  No deformities, BUE and BLE strength normal and equal Skin: warm and dry  Neuro:  CNs 2-12 intact, no focal abnormalities noted Psych:  Normal affect     Laboratory Data:  High Sensitivity Troponin:   Recent Labs  Lab 01/20/23 0510 01/20/23 0729  TROPONINIHS 987* 992*     Chemistry Recent Labs  Lab 01/18/23 0652 01/18/23 1117 01/18/23 1653 01/19/23 0720 01/20/23 0510 01/20/23 0729  NA  --    < > 121* 120*  --  121*  K  --    < > 5.3* 5.3*  --  4.4  CL  --    < > 85* 83*  --  85*  CO2  --    < > 25 24  --  25  GLUCOSE  --    < > 88 85  --  92  BUN  --    < > 51* 55*  --  56*  CREATININE  --    < > 2.75* 2.70*  --  2.16*  CALCIUM  --    < > 7.8* 7.8*  --  7.9*  MG  3.4*  --   --   --  3.6*  --   GFRNONAA  --    < > 23* 24*  --  31*  ANIONGAP  --    < > 11 13  --  11   < > = values in this interval not displayed.    Recent Labs  Lab 01/18/23 0105 01/19/23 0720 01/20/23 0729  PROT 6.9 6.9 6.5  ALBUMIN 3.4* 3.4* 3.2*  AST 340* 671* 331*  ALT 326* 692* 552*  ALKPHOS 628* 697* 671*  BILITOT 1.0 1.3* 1.4*   Lipids No results for input(s): "CHOL", "TRIG", "HDL", "LABVLDL", "LDLCALC", "CHOLHDL" in the last 168 hours.  Hematology Recent Labs  Lab 01/18/23 1117 01/19/23 0720 01/20/23 0510  WBC 7.8 9.1 6.6  RBC 2.16* 2.28* 2.49*  HGB 6.6* 6.9* 7.5*  HCT 20.9* 21.7* 23.8*  MCV 96.8 95.2 95.6  MCH 30.6 30.3 30.1  MCHC 31.6 31.8 31.5  RDW 17.2* 17.3* 16.4*  PLT 347 348 252   Thyroid No results for input(s): "TSH", "FREET4" in the last 168 hours.  BNPNo results for input(s): "BNP", "PROBNP" in the last 168 hours.  DDimer  Recent Labs  Lab 01/18/23 0105  DDIMER 6.95*     Radiology/Studies:  US Abdomen Limited  Result Date: 01/18/2023 CLINICAL DATA:  Nausea for 4 days, increased liver function tests. EXAM: ULTRASOUND ABDOMEN LIMITED RIGHT UPPER QUADRANT COMPARISON:  CT abdomen and pelvis dated 12/13/2022. FINDINGS: Gallbladder: Gallbladder sludge is noted. The gallbladder wall is mildly thickened, measuring 5 mm. There is pericholecystic fluid. No sonographic Murphy sign noted by sonographer. Common bile duct: Diameter: 5 mm Liver: No focal lesion identified. Within normal limits in parenchymal echogenicity. Portal vein is patent on color Doppler imaging with normal direction of blood flow towards the liver. Other: None. IMPRESSION: Gallbladder sludge with mild gallbladder wall thickening and pericholecystic fluid. Findings are suspicious for acute cholecystitis. Electronically Signed   By: Romona Curls M.D.   On: 01/18/2023 13:47   NM Pulmonary Perfusion  Result Date: 01/18/2023 CLINICAL DATA:  Pulmonary embolism (PE) suspected, low to  intermediate prob, positive D-dimer. Shortness of breath EXAM: NUCLEAR MEDICINE PERFUSION LUNG SCAN TECHNIQUE: Perfusion images were obtained in multiple projections after intravenous injection of radiopharmaceutical. Ventilation scans intentionally deferred if perfusion scan and chest x-ray adequate for interpretation during COVID 19 epidemic. RADIOPHARMACEUTICALS:  4.4 mCi Tc-37m MAA IV COMPARISON:  Chest x-ray 01/17/2023 FINDINGS: Heterogeneous radiotracer distribution within both lungs. Bilateral nonsegmental perfusion defects. IMPRESSION: Intermediate probability for pulmonary embolism. Electronically Signed   By: Duanne Guess D.O.   On: 01/18/2023 10:14   CT Head Wo Contrast  Result Date: 01/18/2023 CLINICAL DATA:  Mental status change, unknown cause. EXAM: CT HEAD WITHOUT CONTRAST TECHNIQUE: Contiguous axial images were obtained from the base of the skull through the vertex without intravenous contrast. RADIATION DOSE REDUCTION: This exam was performed according to the departmental dose-optimization program which includes automated exposure control, adjustment of the mA and/or kV according to patient size and/or use of iterative reconstruction technique. COMPARISON:  06/27/2013 FINDINGS: Brain: Area of old infarct and encephalomalacia within the posterior left parietal lobe, stable. No acute intracranial abnormality. Specifically, no hemorrhage, hydrocephalus, mass lesion, acute infarction, or significant intracranial injury. Vascular: No hyperdense vessel or unexpected calcification. Skull: No acute calvarial abnormality. 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 No acute intracranial abnormality. Electronically Signed   By: Charlett Nose M.D.   On:  01/18/2023 01:55   DG Chest 2 View  Result Date: 01/17/2023 CLINICAL DATA:  Shortness of breath EXAM: CHEST - 2 VIEW COMPARISON:  Radiographs 12/18/2022 FINDINGS: Left chest  wall ICD. Stable cardiomediastinal silhouette. Low lung volumes accentuate pulmonary vascularity. No focal consolidation, pleural effusion, or pneumothorax. No displaced rib fractures. Left reverse TSA. IMPRESSION: No active cardiopulmonary disease. Electronically Signed   By: Minerva Fester M.D.   On: 01/17/2023 23:27     Assessment and Plan:   1.Afib - unclear chronicity, patient reports this is a new diagnosis but eliquis is on his home med list I am not sure of the indication, awaiting [facility] -CHADS2Vasc score is at least 4 (age, HTN, prior CVA). ALready on anticoag for suspected PE - continue hep gtt, closer to D/C transition to DOAC.  - start oral lopressor 25mg  every 6 hours with hold parameters   2.Elevated troponin - in setting of afib with RVR, anemia, acute liver inflammation, possible PE - suspect most likely demand ischemia from other acute processes, less likely primary cardiac etiology. In general not a cath candidate given malignancy, renal failure, anemia - keep Hgb at 8, manage other acute systemic processses. No plans for ishcemic testing at this time - f/u echo  3.AKI on CKD - per primary team   4. 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 Elevated LFTs - per primary team and GI  6. Anemia - Hgb down to 6.9, transfused 1 unit  7.Malignancy - from heme notes: T angio C/A/P performed for chest/back pain. Scan showed mediastinal adenopathy is noted as well as extensive periaortic and right iliac adenopathy is noted most consistent with metastatic disease or possibly lymphoma. Also noted are diffuse sclerotic densities throughout the visualized skeleton consistent with osseous metastases.Mild right hydroureteronephrosis is noted which appears to be due to occlusion from irregular mass involving the posterior portion of the urinary bladder in the region of the right ureterovesical junction.  - plans for biopsy of bladder mass  according to notes  8.Possible PE - intermediate VQ scan - avoiding CT with contrast due to renal dysfunction - obtain LE venous US, if DVT present would increase probability of PE. - conitnue heparin gtt  9.History of AICD - limited history in [facility], has not seen anyone here since 2008. Reports has been followed by [facility] cardiology in [place] - can see in records he had an MI with VF arrest in 2005 treated with PCI to the LCX, AICD apparently implanted after for secondary prevention - he reports followed by [facility] cardiology who has been monitoring his AICD, will request records  10. CAD - limited history available in epic - can see he had an MI with VF arrest in 2005 treated with PCI to the LCX, - statin on hold given elevated LFTs   For questions or updates, please contact [facility] Please consult [email or web address] for contact info under    Signed, Dina Rich, MD  01/20/2023 9:24 AM

## 2023-01-20 NOTE — Progress Notes (Signed)
Date and time results received: 01/20/23 @6 :00am Test: Troponin Critical Value: 987 Name of Provider Notified: Dr. Charmaine Downs @6 :00am Orders Received? Or Actions Taken?: See new orders   01/20/23 0600  Vitals  BP (!) 174/64  MAP (mmHg) 104  BP Location Left Arm  BP Method Automatic  Patient Position (if appropriate) Lying  Pulse Rate (!) 114  Pulse Rate Source Monitor  ECG Heart Rate (!) 114  Resp 16  Level of Consciousness  Level of Consciousness Alert  Oxygen Therapy  SpO2 98 %  O2 Device Nasal Cannula  O2 Flow Rate (L/min) 2 L/min  Pain Assessment  Pain Scale 0-10  Pain Score 5  Pain Type Acute pain  Pain Location Chest  Pain Orientation Mid  Pain Intervention(s) MD notified (Comment);Medication (See eMAR)  MEWS Score  MEWS Temp 0  MEWS Systolic 0  MEWS Pulse 2  MEWS RR 0  MEWS LOC 0  MEWS Score 2  MEWS Score Color Yellow  Provider Notification  Provider Name/Title Dr. Charmaine Downs  Date Provider Notified 01/20/23  Time Provider Notified 0600  Method of Notification Page  Notification Reason Critical Result;New onset of dysrhythmia  Test performed and critical result Troponin 987  Date Critical Result Received 01/20/23  Time Critical Result Received 0600  Type of New Onset of Dysrhythmia Atrial fibrillation;Other (Comment) (afib with RVR)  Symptoms of New Onset of Dysrhythmia Chest pain  Provider response See new orders  Date of Provider Response 01/20/23  Time of Provider Response [phone]

## 2023-01-20 NOTE — Plan of Care (Signed)

## 2023-01-20 NOTE — Plan of Care (Signed)

## 2023-01-20 NOTE — Progress Notes (Signed)
Progress Note   Patient: Luis Donovan:[id number] DOB: January 16, 1946 DOA: 01/17/2023     2 DOS: the patient was seen and examined on 01/20/2023   Brief hospital admission narrative: As per H&P written by Dr. Thomes Dinning on 01/18/2023 Luis Donovan is a 77 y.o. male with medical history significant of hypertension, hyperlipidemia, known metastatic disease with unknown primary, GERD, PTSD, OSA, CVA, anemia of chronic disease who presents to the emergency department accompanied by wife due to shortness of breath which has been ongoing for several weeks, but worsened last couple of days.  Exertion and walking aggravates the shortness of breath, he also complained of fatigue and dizziness. Patient was admitted from 8/14 to 8/16 at [facility] long due to acute kidney injury secondary to decreased appetite associated with recent COVID infection.  He was discharged home with home health PT.  Patient states he has been having this shortness of breath since last discharge from the hospital.   ED Course:  In the emergency department, he was hemodynamically stable.  Workup in the ED showed normocytic anemia.  BMP showed sodium 124, potassium 5.9, chloride 86, bicarb 27, blood glucose 121, BUN 46, creatinine 2.54, AST 340, ALT 326, ALP 628, albumin 3.4, D-dimer 6.95, urinalysis was normal CT head without contrast showed chronic encephalomalacia within the posterior left parietal lobe.  No acute intracranial abnormality Chest x-ray showed no active cardiopulmonary disease Patient was started on heparin drip due to suspected pulmonary embolism and with plan to do VQ scan in the morning.  Zofran was given IV hydration was provided.  Lokelma was given. Hospitalist was asked to admit patient for further evaluation and management.  Assessment and Plan: 1-hyponatremia -Multifactorial, with concerns for SIADH age and dehydration. -Gentle fluid provided without much improvement. -Hemodynamically stable at the moment; will  hold fluids and follow electrolytes trend. -Serum osmolality was normal; patient urine osmolality 296. -Continue supportive care. -Continue holding on any further fluid resuscitation; patient receiving heparin drip we will add fluid and is status post 1 unit PRBC transfusion; will provide IV Lasix x 1 dose -Continue to follow electrolytes trend.  2-symptomatic anemia -Hemoglobin 6.9 patient reporting feeling weak and tired -Stable blood pressure and no signs of overt bleeding -Status post 1 unit PRBC transfusion and current hemoglobin level 7.5 -Continue to follow trend.  3-elevated D-dimer -With positive intermediate probability V/Q scan -Continue treatment with heparin drip. -Due to renal failure unable to assess CT angiogram of the chest. -Will follow lower extremity Dopplers and 2D echo at the moment.  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 -Creatinine currently 2.16 -Patient advised to maintain adequate oral hydration -Continue to follow renal function trend.  5-hyperkalemia -Improved after Lokelma given -No acute changes appreciated on telemetry -Follow-up potassium in AM.  6-transaminitis -Will follow GI service recommendation -Slightly improved this morning; continue to follow trend.  7-GERD -Continue PPI. -Per GI once further medically stable plan is to proceed with EGD and colonoscopy.  8-malignancy -continue outpatient follow up with oncology and urology -per family has been approved by [facility] for PET Scan  9-generalized chronic pain -continue home analgesic regimen and further adjusted as needed.  10-chest discomfort overnight/elevated troponin -A-fib with RVR was appreciated and aborted with the use of IV Lopressor. -Most likely demand ischemia; not a candidate for cardiac  cath -Continue to follow troponin -  2D echo has been ordered; appreciate assistance and recommendation by cardiology service. -Continue heparin drip at the use of metoprolol.  Subjective:  Currently chest pain-free; no fever.  Reports no nausea or vomiting.  Continues to have abdominal discomfort intermittently (chronically).  Expressed that he is breathing comfortable.  No overt bleeding.  Physical Exam: Vitals:   01/20/23 1715 01/20/23 1730 01/20/23 1745 01/20/23 1752  BP: (!) 141/95  (!) 137/99 (!) 137/99  Pulse:  75  68  Resp: 17 20 20    Temp:      TempSrc:      SpO2: 96% 100% 100%   Weight:      Height:       General exam: Alert, awake, oriented x 3; oxygen supplementation for comfort provided.  Patient overnight with complaints of chest pain and noticed presence of A-fib with RVR. Respiratory system: No using accessory muscles; mild expiratory wheezing appreciated, no frank crackles; decreased breath sounds at the bases. Cardiovascular system: At time of my evaluation back into sinus rhythm with controlled rate; no rubs, no gallops, unable to properly assess JVD with body habitus. Gastrointestinal system: Abdomen is obese, nondistended, soft and nontender. No organomegaly or masses felt. Normal bowel sounds heard. Central nervous system: No focal neurological deficits. Extremities: No cyanosis or clubbing; trace edema appreciated bilaterally. Skin: No petechiae. Psychiatry: Judgement and insight appear normal. Mood & affect appropriate.   Data Reviewed: CBC: White blood cells 6.6, hemoglobin 7.5, platelet count 252K Comprehensive metabolic panel: Sodium 121, potassium 4.4, chloride 85, bicarb 25, BUN 56, creatinine 2.16, AST 331, ALT 552, alkaline phosphatase 671 and GFR 31 Troponin: 987>>> 992  Family Communication: Wife at bedside.  Disposition: Status is: Inpatient Remains inpatient appropriate because: Continue treatment with heparin drip, follow GI service  recommendation for further evaluation and management of his transaminitis and concern for underlying bleeding in GI tract.  Follow hemoglobin trend and transfuse as needed.   Planned Discharge Destination: Home  CRITICAL CARE Performed by: Vassie Loll   Total critical care time: 60 minutes  Critical care time was exclusive of separately billable procedures and treating other patients.  Critical care was necessary to treat or prevent imminent or life-threatening deterioration.  Critical care was time spent personally by me on the following activities: development of treatment plan with patient and/or surrogate as well as nursing, discussions with consultants, evaluation of patient's response to treatment, examination of patient, obtaining history from patient or surrogate, ordering and performing treatments and interventions, ordering and review of laboratory studies, ordering and review of radiographic studies, pulse oximetry and re-evaluation of patient's condition.    Author: Vassie Loll, MD 01/20/2023 6:24 PM  For on call review [email or web address].

## 2023-01-20 NOTE — Progress Notes (Signed)
Subjective: Patient feeling okay today. No nausea or vomiting. No BMs thus far. Has some continued mild abdominal pain.   Objective: Vital signs in last 24 hours: Temp:  [97.8 F (36.6 C)-98.2 F (36.8 C)] 98.1 F (36.7 C) (09/04 0738) Pulse Rate:  [63-128] 128 (09/04 0800) Resp:  [12-27] 20 (09/04 0830) BP: (102-174)/(47-121) 133/69 (09/04 0830) SpO2:  [88 %-100 %] 100 % (09/04 0830) Last BM Date : 01/19/23 General:   Alert and oriented, pleasant Head:  Normocephalic and atraumatic. Eyes:  No icterus, sclera clear. Conjuctiva pink.  Mouth:  Without lesions, mucosa pink and moist.  Heart:  S1, S2 present, no murmurs noted.  Lungs: Clear to auscultation bilaterally, without wheezing, rales, or rhonchi.  Abdomen:  Bowel sounds present, soft, Mild TTP of LUQ. non-distended. No HSM or hernias noted. No rebound or guarding. No masses appreciated  Msk:  Symmetrical without gross deformities. Normal posture. Pulses:  Normal pulses noted. Extremities:  Without clubbing or edema. Neurologic:  Alert and  oriented x4;  grossly normal neurologically. Skin:  Warm and dry, intact without significant lesions.  Psych:  Alert and cooperative. Normal mood and affect.  Intake/Output from previous day: 09/03 0701 - 09/04 0700 In: 2186.2 [P.O.:1320; I.V.:608.2; Blood:258] Out: 625 [Urine:625] Intake/Output this shift: Total I/O In: 53.8 [I.V.:53.8] Out: 400 [Urine:400]  Lab Results: Recent Labs    01/18/23 1117 01/19/23 0720 01/20/23 0510  WBC 7.8 9.1 6.6  HGB 6.6* 6.9* 7.5*  HCT 20.9* 21.7* 23.8*  PLT 347 348 252   BMET Recent Labs    01/18/23 1653 01/19/23 0720 01/20/23 0729  NA 121* 120* 121*  K 5.3* 5.3* 4.4  CL 85* 83* 85*  CO2 25 24 25   GLUCOSE 88 85 92  BUN 51* 55* 56*  CREATININE 2.75* 2.70* 2.16*  CALCIUM 7.8* 7.8* 7.9*   LFT Recent Labs    01/18/23 0105 01/19/23 0720 01/20/23 0729  PROT 6.9 6.9 6.5  ALBUMIN 3.4* 3.4* 3.2*  AST 340* 671* 331*  ALT 326*  692* 552*  ALKPHOS 628* 697* 671*  BILITOT 1.0 1.3* 1.4*   PT/INR Recent Labs    01/19/23 1412  LABPROT 17.0*  INR 1.4*   Hepatitis Panel Recent Labs    01/18/23 0643  HEPBSAG NON REACTIVE  HCVAB NON REACTIVE  HEPAIGM NON REACTIVE  HEPBIGM NON REACTIVE    Assessment: 77 year old male admitted to the hospital for worsening shortness of breath and fatigue, found to have PE, now on HEPARIN drip,  GI was consulted for evaluation of anemia and abnormal LFTs.  Elevated LFTs: Elevated alk phos since July 2024 though other LFTs have been normal.  Acute bump in LFTs on 9/2 with Peak yesterday (AST 671, ALT 692, T bili 1.3, AP 697), though aminotransferases appear to be trending back down today with AST 331, ALT 552, ALK phos 671, T bili 1.4. INR yesterday was 1.4, plt count remains normal at 252k.  Query if elevated alk phos is driven by metastatic disease to the bone, Alk phos isoenzymes were to be checked but unable to order via hospital lab. No recent hepatotoxic medication started. As LDH was elevated, could be dealing with an ischemic liver injury.   [place] was negative for focal liver lesions or biliary ductal dilatation though did have gallbladder sludge with mild gallbladder wall thickening and pericholecystic fluid suspicious for acute cholecystitis.  He also endorsed substernal/upper abdominal pain rating to his back along with nausea, vomiting x 3 weeks.  HIDA scan today  with dyskinesia, I discussed results with the patient as well as possible outpatient surgical referral once other acute issues have resolved.   Acute hepatitis panel was negative Autoimmune serologies are still pending, IgG normal at 874 Ferritin elevated at 40-58, saturation 57%(reactive?)  Hemochromatosis lab pending  CK normal at 296, troponin 987/992 HCV RNA, HSV, EBV, CMV serologies pending  Patient's wife reported some increased forgetfulness recently though ammonia levels yesterday were normal. He is alert and  oriented during encounter today.   Anemia: steadily declining since June 2024.  Hemoglobin 7.3 on admission.  Dropped to 6.6 yesterday and he received 1 unit of packed red blood cells.  Hemoglobin 7.5 this morning. no overt GI bleeding.  No evidence of iron deficiency though admits to taking Aleve daily for the last several years which increases risk for PUD.  No EGD or colonoscopy at least 10 years.  Will need EGD and colonoscopy once renal function has improved.  Creatinine peaked at 2.75 two days ago, mildly improved at 2.16 today though still quite elevated,  Will hold off on prepping today and reassess in the a.m. for possible prep for colonoscopy EGD tomorrow given his renal function continues to be abnormal and sodium is 121. Discussed plan with the patient and his wife.   Plan: Follow pending serologies Consider outpatient gen surg referral for GB removal Trend h&h, monitor for overt GI bleeding, transfuse as needed for hgb <7 PPI BID Monitor LFTs daily, repeat INR tomorrow  EGD/Colonoscopy once renal function/sodium improved, reassess in a.m. for possible prepping tomorrow    LOS: 2 days    01/20/2023, 10:32 AM   Amyriah Buras L. Jeanmarie Hubert, MSN, APRN, AGNP-C Adult-Gerontology Nurse Practitioner [facility] at [facility]

## 2023-01-21 ENCOUNTER — Inpatient Hospital Stay (HOSPITAL_COMMUNITY): Payer: No Typology Code available for payment source

## 2023-01-21 DIAGNOSIS — R0602 Shortness of breath: Secondary | ICD-10-CM | POA: Diagnosis not present

## 2023-01-21 DIAGNOSIS — N1832 Chronic kidney disease, stage 3b: Secondary | ICD-10-CM

## 2023-01-21 DIAGNOSIS — R55 Syncope and collapse: Secondary | ICD-10-CM

## 2023-01-21 DIAGNOSIS — K219 Gastro-esophageal reflux disease without esophagitis: Secondary | ICD-10-CM | POA: Diagnosis not present

## 2023-01-21 DIAGNOSIS — D631 Anemia in chronic kidney disease: Secondary | ICD-10-CM

## 2023-01-21 DIAGNOSIS — R7401 Elevation of levels of liver transaminase levels: Secondary | ICD-10-CM | POA: Diagnosis not present

## 2023-01-21 DIAGNOSIS — E871 Hypo-osmolality and hyponatremia: Secondary | ICD-10-CM | POA: Diagnosis not present

## 2023-01-21 DIAGNOSIS — E875 Hyperkalemia: Secondary | ICD-10-CM | POA: Diagnosis not present

## 2023-01-21 DIAGNOSIS — N179 Acute kidney failure, unspecified: Secondary | ICD-10-CM | POA: Diagnosis not present

## 2023-01-21 DIAGNOSIS — R748 Abnormal levels of other serum enzymes: Secondary | ICD-10-CM

## 2023-01-21 DIAGNOSIS — I4891 Unspecified atrial fibrillation: Secondary | ICD-10-CM | POA: Diagnosis not present

## 2023-01-21 LAB — HSV(HERPES SIMPLEX VRS) I + II AB-IGG
HSV 1 Glycoprotein G Ab, IgG: <0.91 {index} (ref 0.00–0.90)
HSV 2 Glycoprotein G Ab, IgG: 4.31 {index} — ABNORMAL HIGH (ref 0.00–0.90)

## 2023-01-21 LAB — ECHOCARDIOGRAM COMPLETE
AR max vel: 1.57 cm2
AV Area VTI: 1.77 cm2
AV Area mean vel: 1.61 cm2
AV Mean grad: 7 mmHg
AV Peak grad: 15 mmHg
Ao pk vel: 1.93 m/s
Area-P 1/2: 5.23 cm2
Height: 67 in
MV M vel: 5.08 m/s
MV Peak grad: 103 mmHg
Radius: 0.2 cm
S' Lateral: 3.2 cm
Weight: 3668.45 [oz_av]

## 2023-01-21 LAB — CBC
HCT: 25.9 % — ABNORMAL LOW (ref 39.0–52.0)
Hemoglobin: 8 g/dL — ABNORMAL LOW (ref 13.0–17.0)
MCH: 30.1 pg (ref 26.0–34.0)
MCHC: 30.9 g/dL (ref 30.0–36.0)
MCV: 97.4 fL (ref 80.0–100.0)
Platelets: 241 10*3/uL (ref 150–400)
RBC: 2.66 MIL/uL — ABNORMAL LOW (ref 4.22–5.81)
RDW: 17.2 % — ABNORMAL HIGH (ref 11.5–15.5)
WBC: 9.8 10*3/uL (ref 4.0–10.5)
nRBC: 2 % — ABNORMAL HIGH (ref 0.0–0.2)

## 2023-01-21 LAB — COMPREHENSIVE METABOLIC PANEL
ALT: 528 U/L — ABNORMAL HIGH (ref 0–44)
AST: 282 U/L — ABNORMAL HIGH (ref 15–41)
Albumin: 3 g/dL — ABNORMAL LOW (ref 3.5–5.0)
Alkaline Phosphatase: 630 U/L — ABNORMAL HIGH (ref 38–126)
Anion gap: 12 (ref 5–15)
BUN: 56 mg/dL — ABNORMAL HIGH (ref 8–23)
CO2: 24 mmol/L (ref 22–32)
Calcium: 7.8 mg/dL — ABNORMAL LOW (ref 8.9–10.3)
Chloride: 86 mmol/L — ABNORMAL LOW (ref 98–111)
Creatinine, Ser: 2.05 mg/dL — ABNORMAL HIGH (ref 0.61–1.24)
GFR, Estimated: 33 mL/min — ABNORMAL LOW (ref >60)
Glucose, Bld: 85 mg/dL (ref 70–99)
Potassium: 5.4 mmol/L — ABNORMAL HIGH (ref 3.5–5.1)
Sodium: 122 mmol/L — ABNORMAL LOW (ref 135–145)
Total Bilirubin: 1.3 mg/dL — ABNORMAL HIGH (ref 0.3–1.2)
Total Protein: 6.6 g/dL (ref 6.5–8.1)

## 2023-01-21 LAB — HCV RNA QUANT: HCV Quantitative: NOT DETECTED [IU]/mL (ref >50)

## 2023-01-21 LAB — EBV AB TO VIRAL CAPSID AG PNL, IGG+IGM
EBV VCA IgG: >600 U/mL — ABNORMAL HIGH (ref 0.0–17.9)
EBV VCA IgM: <36 U/mL (ref 0.0–35.9)

## 2023-01-21 LAB — TYPE AND SCREEN
ABO/RH(D): A POS
Antibody Screen: NEGATIVE

## 2023-01-21 LAB — GLUCOSE, CAPILLARY: Glucose-Capillary: 88 mg/dL (ref 70–99)

## 2023-01-21 LAB — HEPATITIS B DNA, ULTRAQUANTITATIVE, PCR
HBV DNA SERPL PCR-ACNC: NOT DETECTED [IU]/mL
HBV DNA SERPL PCR-LOG IU: UNDETERMINED {Log_IU}/mL

## 2023-01-21 LAB — HEPARIN LEVEL (UNFRACTIONATED): Heparin Unfractionated: 0.36 [IU]/mL (ref 0.30–0.70)

## 2023-01-21 LAB — ANA W/REFLEX IF POSITIVE: Anti Nuclear Antibody (ANA): NEGATIVE

## 2023-01-21 LAB — CMV IGM: CMV IgM: <30 [AU]/ml (ref 0.0–29.9)

## 2023-01-21 LAB — TROPONIN I (HIGH SENSITIVITY): Troponin I (High Sensitivity): 563 ng/L (ref <18)

## 2023-01-21 MED ORDER — FUROSEMIDE 10 MG/ML IJ SOLN
40.0000 mg | Freq: Two times a day (BID) | INTRAMUSCULAR | Status: DC
  Administered 2023-01-22: 40 mg via INTRAVENOUS
  Filled 2023-01-21: qty 4

## 2023-01-21 MED ORDER — FUROSEMIDE 10 MG/ML IJ SOLN
40.0000 mg | Freq: Once | INTRAMUSCULAR | Status: AC
  Administered 2023-01-21: 40 mg via INTRAVENOUS
  Filled 2023-01-21: qty 4

## 2023-01-21 NOTE — Progress Notes (Addendum)
Progress Note   Patient: Luis Donovan WUJ:[id number] DOB: 12-11-1945 DOA: 01/17/2023     3 DOS: the patient was seen and examined on 01/21/2023   Brief hospital admission narrative: As per H&P written by Dr. Thomes Dinning on 01/18/2023 Luis Donovan is a 77 y.o. male with medical history significant of hypertension, hyperlipidemia, known metastatic disease with unknown primary, GERD, PTSD, OSA, CVA, anemia of chronic disease who presents to the emergency department accompanied by wife due to shortness of breath which has been ongoing for several weeks, but worsened last couple of days.  Exertion and walking aggravates the shortness of breath, he also complained of fatigue and dizziness. Patient was admitted from 8/14 to 8/16 at [facility] long due to acute kidney injury secondary to decreased appetite associated with recent COVID infection.  He was discharged home with home health PT.  Patient states he has been having this shortness of breath since last discharge from the hospital.   ED Course:  In the emergency department, he was hemodynamically stable.  Workup in the ED showed normocytic anemia.  BMP showed sodium 124, potassium 5.9, chloride 86, bicarb 27, blood glucose 121, BUN 46, creatinine 2.54, AST 340, ALT 326, ALP 628, albumin 3.4, D-dimer 6.95, urinalysis was normal CT head without contrast showed chronic encephalomalacia within the posterior left parietal lobe.  No acute intracranial abnormality Chest x-ray showed no active cardiopulmonary disease Patient was started on heparin drip due to suspected pulmonary embolism and with plan to do VQ scan in the morning.  Zofran was given IV hydration was provided.  Lokelma was given. Hospitalist was asked to admit patient for further evaluation and management.  Assessment and Plan: 1-hyponatremia -Multifactorial, with concerns for SIADH age and dehydration. -Gentle fluid provided without much improvement. -Hemodynamically stable at the moment; will  hold fluids and follow electrolytes trend. -Serum osmolality was normal; patient urine osmolality 296. -Continue supportive care. -Continue holding on any further fluid resuscitation; patient receiving heparin drip we will add fluid and is status post 1 unit PRBC transfusion; patient will be initiated on Lasix. -So far sodium level has started to trend up. -Continue to follow electrolytes trend.  2-symptomatic anemia -Hemoglobin 6.9 patient reporting feeling weak and tired -Stable vital signs. -Status post 1 unit PRBC transfusion and current hemoglobin level 8.0 -Continue to follow trend. -No overt bleeding appreciated.  3-elevated D-dimer -With positive intermediate probability V/Q scan -Continue treatment with heparin drip. -Due to renal failure unable to assess CT angiogram of the chest. -Will follow lower extremity Dopplers and 2D echo at the moment.  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 -Creatinine currently 2.16 -Patient advised to maintain adequate oral hydration -Continue to follow renal function trend.  5-hyperkalemia -Improved after Lokelma given -No acute changes appreciated on telemetry -Follow-up potassium in AM.  6-transaminitis -Will follow GI service recommendation -Slightly improved this morning; continue to follow trend. 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  7-GERD -Continue PPI. -Per GI once further medically stable plan is to proceed with EGD and colonoscopy; which can happen as an outpatient.  8-malignancy -continue outpatient follow up with oncology and urology -per family has been approved by [facility]  for  PET Scan  9-generalized chronic pain -continue home analgesic regimen and further adjusted as needed.  10-chest discomfort overnight/elevated troponin -Most likely demand ischemia; not a candidate for cardiac cath -Continue to follow troponin -2D echo has demonstrated preserved ejection fraction, no regional wall motion abnormality; mild left ventricular hypertrophy and low right ventricular systolic function.  Patient with moderate elevated pulmonary artery systolic pressure. -Continue heparin drip and the use of metoprolol as dictated by cardiology service.. -Given increased swelling, mild orthopnea and concerns for interstitial edema/acute diastolic heart failure will provide IV Lasix.  11-acute diastolic heart failure -Most likely trigger by arrhythmia during transient episode of paroxysmal atrial fibrillation with RVR -IV Lasix will be initiated -Close monitoring the patient renal function and electrolytes.  Subjective:  Chest pain-free, no fever, no nausea vomiting.  Reporting mild orthopnea but acute respiratory effort and saturation on 2 L supplementation.  No overt bleeding reported.  Physical Exam: Vitals:   01/21/23 1609 01/21/23 1800 01/21/23 1806 01/21/23 1830  BP:  (!) 151/66 (!) 151/66   Pulse: 72 76 76   Resp: 16 17 20    Temp: (!) 97.5 F (36.4 C)     TempSrc: Oral     SpO2: 100% 100% 93% 100%  Weight:      Height:       General exam: Alert, awake, oriented x 3; following commands appropriately; no focal neurologic deficits appreciated.  Denying chest pain and overall feeling better.  100% saturation appreciated on 2 L nasal cannula supplementation. Respiratory system: Decreased breath sounds at the bases; positive rhonchi right, very little expiratory wheezing on exam. Cardiovascular system: Rate controlled, currently sinus rhythm and on telemetry; no rubs, no gallops, unable to assess JVD with body habitus. Gastrointestinal system: Abdomen is obese,  nondistended, soft and positive bowel sounds and vague diffuse tenderness on palpation. Central nervous system: Alert and oriented. No focal neurological deficits. Extremities: No cyanosis or clubbing; 1+ edema appreciated bilaterally. Skin: No petechiae. Psychiatry: Judgement and insight appear normal. Mood & affect appropriate.   Data Reviewed: CBC: White blood cells 9.8, hemoglobin 8.0 and platelet count 241K Comprehensive metabolic panel: Sodium 122, potassium 5.4, chloride 86, BUN 56, creatinine 2.05, AST 282, ALT 528, alk phos 630 and GFR 33. Troponin: 987>>> 992 >>563  Family Communication: Wife at bedside.  Disposition: Status is: Inpatient Remains inpatient appropriate because: Continue treatment with heparin drip, follow GI service recommendation for further evaluation and management of his transaminitis and concern for underlying bleeding in GI tract.  Follow hemoglobin trend and transfuse as needed.   Planned Discharge Destination: Home  CRITICAL CARE Performed by: Vassie Loll   Total critical care time: 60 minutes  Critical care time was exclusive of separately billable procedures and treating other patients.  Critical care was necessary to treat or prevent imminent or life-threatening deterioration.  Critical care was time spent personally by me on the following activities: development of treatment plan with patient and/or surrogate as well as nursing, discussions with consultants, evaluation of patient's response to treatment, examination of patient, obtaining history from patient or surrogate, ordering and performing treatments and interventions, ordering and review of laboratory studies, ordering and review of radiographic studies, pulse oximetry and re-evaluation of patient's condition.     Author: Vassie Loll, MD 01/21/2023 7:26 PM  For on call review [email or web address].

## 2023-01-21 NOTE — Progress Notes (Signed)
Patient wife sitting at bedside, reported to writer that patient intermittently says things that are "off the wall" and states that he is seeing things such as a sign outside his window. Writer assessed patient and patient alert and oriented x4, able to answer orientation questions and stated that he does sometimes think he see's something that and then realizes its not really there and patient makes the occasional sporadic comment. No complaints of dizziness or headache at this time. Dr Gwenlyn Perking made aware. Will continue to monitor.

## 2023-01-21 NOTE — Progress Notes (Signed)
  Echocardiogram 2D Echocardiogram has been performed.  Maren Reamer 01/21/2023, 12:25 PM

## 2023-01-21 NOTE — Progress Notes (Signed)
Dr Gwenlyn Perking made aware of AM lab potassium level of 5.4.

## 2023-01-21 NOTE — Progress Notes (Signed)
Gastroenterology Progress Note   Referring Provider: No ref. provider found Primary Care Physician:  [facility], [facility]  Patient ID: Luis Donovan; [id number]; December 09, 1945   Subjective:    Patient hungry. No abdominal pain. No n/v. States he had normal brown stool today.  Objective:   Vital signs in last 24 hours: Temp:  [96.8 F (36 C)-98.3 F (36.8 C)] 97.2 F (36.2 C) (09/05 0749) Pulse Rate:  [58-130] 63 (09/05 0930) Resp:  [13-31] 15 (09/05 0930) BP: (99-177)/(54-105) 154/61 (09/05 0925) SpO2:  [77 %-100 %] 100 % (09/05 0930) Last BM Date : 01/20/23 General:   Alert,  Well-developed, well-nourished, pleasant and cooperative in NAD Head:  Normocephalic and atraumatic. Eyes:  Sclera clear, no icterus.   Abdomen:  Soft, nontender and nondistended.  Normal bowel sounds, without guarding, and without rebound.   Extremities:  Without clubbing, deformity or edema. Neurologic:  Alert and  oriented x4;  grossly normal neurologically. Skin:  Intact without significant lesions or rashes. Psych:  Alert and cooperative. Normal mood and affect.  Intake/Output from previous day: 09/04 0701 - 09/05 0700 In: 876 [P.O.:490; I.V.:386] Out: 400 [Urine:400] Intake/Output this shift: Total I/O In: -  Out: 300 [Urine:300]  Lab Results: CBC Recent Labs    01/19/23 0720 01/20/23 0510 01/21/23 0457  WBC 9.1 6.6 9.8  HGB 6.9* 7.5* 8.0*  HCT 21.7* 23.8* 25.9*  MCV 95.2 95.6 97.4  PLT 348 252 241   BMET Recent Labs    01/19/23 0720 01/20/23 0729 01/21/23 0457  NA 120* 121* 122*  K 5.3* 4.4 5.4*  CL 83* 85* 86*  CO2 24 25 24   GLUCOSE 85 92 85  BUN 55* 56* 56*  CREATININE 2.70* 2.16* 2.05*  CALCIUM 7.8* 7.9* 7.8*   LFTs Recent Labs    01/19/23 0720 01/20/23 0729 01/21/23 0457  BILITOT 1.3* 1.4* 1.3*  ALKPHOS 697* 671* 630*  AST 671* 331* 282*  ALT 692* 552* 528*  PROT 6.9 6.5 6.6  ALBUMIN 3.4* 3.2* 3.0*   No results for input(s): "LIPASE" in the last 72  hours. PT/INR Recent Labs    01/19/23 1412  LABPROT 17.0*  INR 1.4*         Imaging Studies: NM Hepato W/EF  Result Date: 01/20/2023 CLINICAL DATA:  Elevated liver function tests.  Pain and nausea EXAM: NUCLEAR MEDICINE HEPATOBILIARY IMAGING WITH GALLBLADDER EF TECHNIQUE: Sequential images of the abdomen were obtained out to 60 minutes following intravenous administration of radiopharmaceutical. After slow intravenous infusion of 2.18 micrograms Cholecystokinin, gallbladder ejection fraction was determined. RADIOPHARMACEUTICALS:  5.2 mCi Tc-21m Choletec IV COMPARISON:  Ultrasound 01/18/2019 FINDINGS: Prompt uptake and biliary excretion of activity by the liver is seen. Gallbladder activity is visualized, consistent with patency of cystic duct. Biliary activity passes into small bowel, consistent with patent common bile duct. Calculated gallbladder ejection fraction is 7%. (At 60 min, normal ejection fraction is greater than 40%.) IMPRESSION: No common duct or cystic duct obstruction. Poor gallbladder ejection fraction however of only 7%. Electronically Signed   By: Karen Kays M.D.   On: 01/20/2023 13:58   US Venous Img Lower Bilateral (DVT)  Result Date: 01/20/2023 CLINICAL DATA:  Dyspnea, short of breath, lower extremity edema EXAM: BILATERAL LOWER EXTREMITY VENOUS DOPPLER ULTRASOUND TECHNIQUE: Gray-scale sonography with graded compression, as well as color Doppler and duplex ultrasound were performed to evaluate the lower extremity deep venous systems from the level of the common femoral vein and including the common femoral, femoral, profunda  femoral, popliteal and calf veins including the posterior tibial, peroneal and gastrocnemius veins when visible. The superficial great saphenous vein was also interrogated. Spectral Doppler was utilized to evaluate flow at rest and with distal augmentation maneuvers in the common femoral, femoral and popliteal veins. COMPARISON:  None Available. FINDINGS:  RIGHT LOWER EXTREMITY Common Femoral Vein: No evidence of thrombus. Normal compressibility, respiratory phasicity and response to augmentation. Saphenofemoral Junction: No evidence of thrombus. Normal compressibility and flow on color Doppler imaging. Profunda Femoral Vein: No evidence of thrombus. Normal compressibility and flow on color Doppler imaging. Femoral Vein: No evidence of thrombus. Normal compressibility, respiratory phasicity and response to augmentation. Popliteal Vein: No evidence of thrombus. Normal compressibility, respiratory phasicity and response to augmentation. Calf Veins: No evidence of thrombus. Normal compressibility and flow on color Doppler imaging. Superficial Great Saphenous Vein: No evidence of thrombus. Normal compressibility. Venous Reflux:  None. Other Findings:  None. LEFT LOWER EXTREMITY Common Femoral Vein: No evidence of thrombus. Normal compressibility, respiratory phasicity and response to augmentation. Saphenofemoral Junction: No evidence of thrombus. Normal compressibility and flow on color Doppler imaging. Profunda Femoral Vein: No evidence of thrombus. Normal compressibility and flow on color Doppler imaging. Femoral Vein: No evidence of thrombus. Normal compressibility, respiratory phasicity and response to augmentation. Popliteal Vein: No evidence of thrombus. Normal compressibility, respiratory phasicity and response to augmentation. Calf Veins: No evidence of thrombus. Normal compressibility and flow on color Doppler imaging. Superficial Great Saphenous Vein: No evidence of thrombus. Normal compressibility. Venous Reflux:  None. Other Findings:  None. IMPRESSION: No evidence of deep venous thrombosis in either lower extremity. Electronically Signed   By: Malachy Moan M.D.   On: 01/20/2023 13:29   US Abdomen Limited  Result Date: 01/18/2023 CLINICAL DATA:  Nausea for 4 days, increased liver function tests. EXAM: ULTRASOUND ABDOMEN LIMITED RIGHT UPPER QUADRANT  COMPARISON:  CT abdomen and pelvis dated 12/13/2022. FINDINGS: Gallbladder: Gallbladder sludge is noted. The gallbladder wall is mildly thickened, measuring 5 mm. There is pericholecystic fluid. No sonographic Murphy sign noted by sonographer. Common bile duct: Diameter: 5 mm Liver: No focal lesion identified. Within normal limits in parenchymal echogenicity. Portal vein is patent on color Doppler imaging with normal direction of blood flow towards the liver. Other: None. IMPRESSION: Gallbladder sludge with mild gallbladder wall thickening and pericholecystic fluid. Findings are suspicious for acute cholecystitis. Electronically Signed   By: Romona Curls M.D.   On: 01/18/2023 13:47   NM Pulmonary Perfusion  Result Date: 01/18/2023 CLINICAL DATA:  Pulmonary embolism (PE) suspected, low to intermediate prob, positive D-dimer. Shortness of breath EXAM: NUCLEAR MEDICINE PERFUSION LUNG SCAN TECHNIQUE: Perfusion images were obtained in multiple projections after intravenous injection of radiopharmaceutical. Ventilation scans intentionally deferred if perfusion scan and chest x-ray adequate for interpretation during COVID 19 epidemic. RADIOPHARMACEUTICALS:  4.4 mCi Tc-28m MAA IV COMPARISON:  Chest x-ray 01/17/2023 FINDINGS: Heterogeneous radiotracer distribution within both lungs. Bilateral nonsegmental perfusion defects. IMPRESSION: Intermediate probability for pulmonary embolism. Electronically Signed   By: Duanne Guess D.O.   On: 01/18/2023 10:14   CT Head Wo Contrast  Result Date: 01/18/2023 CLINICAL DATA:  Mental status change, unknown cause. EXAM: CT HEAD WITHOUT CONTRAST TECHNIQUE: Contiguous axial images were obtained from the base of the skull through the vertex without intravenous contrast. RADIATION DOSE REDUCTION: This exam was performed according to the departmental dose-optimization program which includes automated exposure control, adjustment of the mA and/or kV according to patient size and/or use  of iterative reconstruction technique. COMPARISON:  06/27/2013 FINDINGS: Brain: Area of old infarct and encephalomalacia within the posterior left parietal lobe, stable. No acute intracranial abnormality. Specifically, no hemorrhage, hydrocephalus, mass lesion, acute infarction, or significant intracranial injury. Vascular: No hyperdense vessel or unexpected calcification. Skull: No acute calvarial abnormality. 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 No acute intracranial abnormality. Electronically Signed   By: Charlett Nose M.D.   On: 01/18/2023 01:55   DG Chest 2 View  Result Date: 01/17/2023 CLINICAL DATA:  Shortness of breath EXAM: CHEST - 2 VIEW COMPARISON:  Radiographs 12/18/2022 FINDINGS: Left chest wall ICD. Stable cardiomediastinal silhouette. Low lung volumes accentuate pulmonary vascularity. No focal consolidation, pleural effusion, or pneumothorax. No displaced rib fractures. Left reverse TSA. IMPRESSION: No active cardiopulmonary disease. Electronically Signed   By: Minerva Fester M.D.   On: 01/17/2023 23:27  [2 weeks]  Assessment:    77 year old male admitted to the hospital for worsening shortness of breath and fatigue, suspected PE (NM pulm perfusion test indeterminate, positive Ddimer, negative LE doppers), now on HEPARIN drip,  GI was consulted for evaluation of anemia and abnormal LFTs.   Elevated LFTs: Elevated alk phos since July 2024 although other LFTs have been normal.  Acute bump in LFTs on 9/2 with Peak (AST 671, ALT 692, T bili 1.3, AP 697), though aminotransferases appear to be trending back down today with AST 292, ALT 528. ALK phos 630, T bili 1.3. INR 1.4, plt count remains normal at 241k.  Query if elevated alk phos is driven by metastatic disease to the bone, Alk phos isoenzymes were to be checked but unable to order via hospital lab. No recent hepatotoxic medication started. LDH has  increased from 301 to 1219.     [place] was negative for focal liver lesions or biliary ductal dilatation though did have gallbladder sludge with mild gallbladder wall thickening and pericholecystic fluid suspicious for acute cholecystitis.  He also endorsed substernal/upper abdominal pain rating to his back along with nausea, vomiting x 3 weeks.  HIDA scan this admission with dyskinesia. Currently not experiencing any RUQ pain. Doubt gallbladder is main culprit.     Acute hepatitis panel was negative. ANA, AMA, F-Actin IgG all negative. IgG normal at 874. Ferritin elevated at 4258, saturation 57%. Hemochromatosis lab pending. Recent sed rate 143, CRP 11.1. LDH as previous outlined, 1219. CK normal at 296, troponin 987/992. HCV RNA, HBV DNA pending. CMV IgM negative, HSV1 IgG negative, HSV2 IgG positive. EBV, CMV serologies pending   Patient's wife reported some increased forgetfulness recently though ammonia levels were normal. He is alert and oriented during encounter today.    Anemia: steadily declining since June 2024.  Hemoglobin 7.3 on admission.  Dropped to 6.6 and he received 1 unit of packed red blood cells.  Hemoglobin 8.0 this morning. No overt GI bleeding.  No evidence of iron deficiency though admits to taking Aleve daily for the last several years which increases risk for PUD.  No EGD or colonoscopy at least 10 years.  Will need EGD and colonoscopy once renal function has improved.  Creatinine peaked at 2.75 down to 2.05 today. Sodium 122 (stable), Potassium 5.4 today.     Plan:    Follow pending serologies PPI BID Continue to trend LFTs.  EGD/Colonoscopy once renal function/sodium improved, to discuss timing with Dr. Tasia Catchings.    LOS: 3 days   Leanna Battles. Dixon Boos [facility] [phone] 9/5/202410:27 AM

## 2023-01-21 NOTE — Progress Notes (Addendum)
ANTICOAGULATION CONSULT NOTE   Pharmacy Consult for heparin Indication: pulmonary embolus  No Known Allergies  Patient Measurements: Height: 5\' 7"  (170.2 cm) Weight: 104 kg (229 lb 4.5 oz) IBW/kg (Calculated) : 66.1 Heparin Dosing Weight: 89 kg  Vital Signs: Temp: 97.2 F (36.2 C) (09/05 0749) Temp Source: Oral (09/05 0749) BP: 157/80 (09/05 0700) Pulse Rate: 60 (09/05 0530)  Labs: Recent Labs    01/18/23 1653 01/18/23 2134 01/19/23 0720 01/19/23 1412 01/19/23 1559 01/20/23 0510 01/20/23 0729 01/21/23 0457  HGB  --   --  6.9*  --   --  7.5*  --  8.0*  HCT  --   --  21.7*  --   --  23.8*  --  25.9*  PLT  --   --  348  --   --  252  --  241  LABPROT  --   --   --  17.0*  --   --   --   --   INR  --   --   --  1.4*  --   --   --   --   HEPARINUNFRC  --    < > 0.47  --  0.41 0.44  --  0.36  CREATININE 2.75*  --  2.70*  --   --   --  2.16*  --   CKTOTAL  --   --   --   --   --  296  --   --   TROPONINIHS  --   --   --   --   --  987* 992*  --    < > = values in this interval not displayed.    Estimated Creatinine Clearance: 34.5 mL/min (A) (by C-G formula based on SCr of 2.16 mg/dL (H)).   Medical History: Past Medical History:  Diagnosis Date   Anemia    CVA (cerebral vascular accident) (HCC) 07/17/2013   Empyema (HCC)    Erectile dysfunction due to arterial insufficiency    Essential (primary) hypertension    Gastro-esophageal reflux disease with esophagitis    Head injury    Heart disease    Hyperlipidemia, unspecified    Hypertension    Obesity, unspecified    Pneumonia    Post-traumatic stress disorder, unspecified    Sleep apnea, unspecified    Assessment: 3 yoM presented to the ED with SOB, cough/fatigue/dizziness and generalized not feeling well s/p COVID infection. Patient with elevated d-dimer at 6.95 with concerns for pulmonary embolism. Med rec shows on apixaban PTA. Pharmacy consulted to dose heparin for PE.   Heparin level 0.36,  therapeutic Hgb 8.0 VQ scan shows intermediate probability of PE.   Goal of Therapy:  Heparin level 0.3-0.7 units/ml Monitor platelets by anticoagulation protocol: Yes   Plan:  Continue heparin infusion at 1900 units/hr Recheck anti-Xa level daily while on heparin Continue to monitor H&H and platelets F/U transition to po DOAC  Judeth Cornfield, PharmD Clinical Pharmacist 01/21/2023 8:03 AM

## 2023-01-21 NOTE — Progress Notes (Signed)
Rounding Note    Patient Name: Luis Donovan Date of Encounter: 01/21/2023  [facility] Cardiologist: [facility] Kathryne Sharper  Subjective   Mild SOB this morning.   Inpatient Medications    Scheduled Meds:  Chlorhexidine Gluconate Cloth  6 each Topical Q0600   feeding supplement  237 mL Oral BID BM   metoprolol tartrate  25 mg Oral Q6H   multivitamin with minerals  1 tablet Oral Daily   oxyCODONE  10 mg Oral Q12H   pantoprazole  40 mg Oral BID   polyethylene glycol  17 g Oral BID   traZODone  50 mg Oral QHS   Continuous Infusions:  heparin 1,900 Units/hr (01/21/23 0221)   PRN Meds: acetaminophen **OR** acetaminophen, albuterol, alum & mag hydroxide-simeth, alum & mag hydroxide-simeth, levalbuterol, morphine injection, nitroGLYCERIN, oxyCODONE, prochlorperazine   Vital Signs    Vitals:   01/21/23 0529 01/21/23 0530 01/21/23 0700 01/21/23 0749  BP:   (!) 157/80   Pulse: 60 60    Resp: 15 15 16    Temp:    (!) 97.2 F (36.2 C)  TempSrc:    Oral  SpO2: 100% 100%    Weight:      Height:        Intake/Output Summary (Last 24 hours) at 01/21/2023 0824 Last data filed at 01/21/2023 0221 Gross per 24 hour  Intake 876.03 ml  Output --  Net 876.03 ml      01/18/2023    4:00 AM 01/17/2023   10:16 PM 01/13/2023    9:47 AM  Last 3 Weights  Weight (lbs) 229 lb 4.5 oz 228 lb 14.4 oz 228 lb 14.4 oz  Weight (kg) 104 kg 103.828 kg 103.828 kg      Telemetry    NSR - Personally Reviewed  ECG    N/a - Personally Reviewed  Physical Exam   GEN: No acute distress.   Neck: No JVD Cardiac: RRR, no murmurs, rubs, or gallops.  Respiratory: Clear to auscultation bilaterally. GI: Soft, nontender, non-distended  MS: 1+bilateral LE edema Neuro:  Nonfocal  Psych: Normal affect   Labs    High Sensitivity Troponin:   Recent Labs  Lab 01/20/23 0510 01/20/23 0729  TROPONINIHS 987* 992*     Chemistry Recent Labs  Lab 01/18/23 0105 01/18/23 0652  01/18/23 1117 01/18/23 1653 01/19/23 0720 01/20/23 0510 01/20/23 0729  NA 124*  --    < > 121* 120*  --  121*  K 5.9*  --    < > 5.3* 5.3*  --  4.4  CL 86*  --    < > 85* 83*  --  85*  CO2 27  --    < > 25 24  --  25  GLUCOSE 121*  --    < > 88 85  --  92  BUN 46*  --    < > 51* 55*  --  56*  CREATININE 2.54*  --    < > 2.75* 2.70*  --  2.16*  CALCIUM 8.2*  --    < > 7.8* 7.8*  --  7.9*  MG  --  3.4*  --   --   --  3.6*  --   PROT 6.9  --   --   --  6.9  --  6.5  ALBUMIN 3.4*  --   --   --  3.4*  --  3.2*  AST 340*  --   --   --  671*  --  331*  ALT 326*  --   --   --  692*  --  552*  ALKPHOS 628*  --   --   --  697*  --  671*  BILITOT 1.0  --   --   --  1.3*  --  1.4*  GFRNONAA 26*  --    < > 23* 24*  --  31*  ANIONGAP 11  --    < > 11 13  --  11   < > = values in this interval not displayed.    Lipids No results for input(s): "CHOL", "TRIG", "HDL", "LABVLDL", "LDLCALC", "CHOLHDL" in the last 168 hours.  Hematology Recent Labs  Lab 01/19/23 0720 01/20/23 0510 01/21/23 0457  WBC 9.1 6.6 9.8  RBC 2.28* 2.49* 2.66*  HGB 6.9* 7.5* 8.0*  HCT 21.7* 23.8* 25.9*  MCV 95.2 95.6 97.4  MCH 30.3 30.1 30.1  MCHC 31.8 31.5 30.9  RDW 17.3* 16.4* 17.2*  PLT 348 252 241   Thyroid No results for input(s): "TSH", "FREET4" in the last 168 hours.  BNPNo results for input(s): "BNP", "PROBNP" in the last 168 hours.  DDimer  Recent Labs  Lab 01/18/23 0105  DDIMER 6.95*     Radiology    NM Hepato W/EF  Result Date: 01/20/2023 CLINICAL DATA:  Elevated liver function tests.  Pain and nausea EXAM: NUCLEAR MEDICINE HEPATOBILIARY IMAGING WITH GALLBLADDER EF TECHNIQUE: Sequential images of the abdomen were obtained out to 60 minutes following intravenous administration of radiopharmaceutical. After slow intravenous infusion of 2.18 micrograms Cholecystokinin, gallbladder ejection fraction was determined. RADIOPHARMACEUTICALS:  5.2 mCi Tc-67m Choletec IV COMPARISON:  Ultrasound 01/18/2019  FINDINGS: Prompt uptake and biliary excretion of activity by the liver is seen. Gallbladder activity is visualized, consistent with patency of cystic duct. Biliary activity passes into small bowel, consistent with patent common bile duct. Calculated gallbladder ejection fraction is 7%. (At 60 min, normal ejection fraction is greater than 40%.) IMPRESSION: No common duct or cystic duct obstruction. Poor gallbladder ejection fraction however of only 7%. Electronically Signed   By: Karen Kays M.D.   On: 01/20/2023 13:58   US Venous Img Lower Bilateral (DVT)  Result Date: 01/20/2023 CLINICAL DATA:  Dyspnea, short of breath, lower extremity edema EXAM: BILATERAL LOWER EXTREMITY VENOUS DOPPLER ULTRASOUND TECHNIQUE: Gray-scale sonography with graded compression, as well as color Doppler and duplex ultrasound were performed to evaluate the lower extremity deep venous systems from the level of the common femoral vein and including the common femoral, femoral, profunda femoral, popliteal and calf veins including the posterior tibial, peroneal and gastrocnemius veins when visible. The superficial great saphenous vein was also interrogated. Spectral Doppler was utilized to evaluate flow at rest and with distal augmentation maneuvers in the common femoral, femoral and popliteal veins. COMPARISON:  None Available. FINDINGS: RIGHT LOWER EXTREMITY Common Femoral Vein: No evidence of thrombus. Normal compressibility, respiratory phasicity and response to augmentation. Saphenofemoral Junction: No evidence of thrombus. Normal compressibility and flow on color Doppler imaging. Profunda Femoral Vein: No evidence of thrombus. Normal compressibility and flow on color Doppler imaging. Femoral Vein: No evidence of thrombus. Normal compressibility, respiratory phasicity and response to augmentation. Popliteal Vein: No evidence of thrombus. Normal compressibility, respiratory phasicity and response to augmentation. Calf Veins: No  evidence of thrombus. Normal compressibility and flow on color Doppler imaging. Superficial Great Saphenous Vein: No evidence of thrombus. Normal compressibility. Venous Reflux:  None. Other Findings:  None. LEFT LOWER EXTREMITY Common  Femoral Vein: No evidence of thrombus. Normal compressibility, respiratory phasicity and response to augmentation. Saphenofemoral Junction: No evidence of thrombus. Normal compressibility and flow on color Doppler imaging. Profunda Femoral Vein: No evidence of thrombus. Normal compressibility and flow on color Doppler imaging. Femoral Vein: No evidence of thrombus. Normal compressibility, respiratory phasicity and response to augmentation. Popliteal Vein: No evidence of thrombus. Normal compressibility, respiratory phasicity and response to augmentation. Calf Veins: No evidence of thrombus. Normal compressibility and flow on color Doppler imaging. Superficial Great Saphenous Vein: No evidence of thrombus. Normal compressibility. Venous Reflux:  None. Other Findings:  None. IMPRESSION: No evidence of deep venous thrombosis in either lower extremity. Electronically Signed   By: Malachy Moan M.D.   On: 01/20/2023 13:29    Cardiac Studies     Patient Profile     Luis Donovan is a 77 y.o. male with a hx of HTN, CAD with prior MI and PCI in 2005, HLD, metastatic cancer unknown primary, OSA, prior CVA, anemia of chronic disease, recent admission 12/2022 with covid, who is being seen 01/20/2023 for the evaluation of afib at the request of Dr Gwenlyn Perking.   Assessment & Plan    1.Afib - unclear chronicity, patient reports this is a new diagnosis but eliquis is on his home med list I am not sure of the indication, awaiting [facility] -CHADS2Vasc score is at least 4 (age, HTN, prior CVA). ALready on anticoag for suspected PE - continue hep gtt, closer to D/C transition to DOAC.   -yesterday started oral lopressor 25mg  q6hrs. He converted to NSR last night. Continue q6 hrs  lopressor today, pending hemodynamics and rates consolidated tomorrow. AM dose was held due to HR of 60 - [facility] obtained, mention PAF prevoiusly noted by device check. Has been on eliquis at home.      2.Elevated troponin - in setting of afib with RVR, anemia, acute liver inflammation, possible PE - peak trop 992, trending down - suspect most likely demand ischemia from other acute processes, less likely primary cardiac etiology. In general not a cath candidate given malignancy, renal failure, anemia - keep Hgb at 8, manage other acute systemic processses. No plans for ishcemic testing at this time - echo is pending    3.AKI on CKD - per primary team     4. Hyponatremia -from primary team note concern for SIADH, hypovolemia   5. Elevated LFTs - per primary team and GI   6. Anemia - Hgb down to 6.9, transfused 1 unit   7.Malignancy - from heme notes: T angio C/A/P performed for chest/back pain. Scan showed mediastinal adenopathy is noted as well as extensive periaortic and right iliac adenopathy is noted most consistent with metastatic disease or possibly lymphoma. Also noted are diffuse sclerotic densities throughout the visualized skeleton consistent with osseous metastases.Mild right hydroureteronephrosis is noted which appears to be due to occlusion from irregular mass involving the posterior portion of the urinary bladder in the region of the right ureterovesical junction.  - plans for biopsy of bladder mass according to notes   8.Possible PE - intermediate VQ scan. LE venous dopplers were negative - avoiding CT with contrast due to renal dysfunction - obtain LE venous US, if DVT present would increase probability of PE. - conitnue heparin gtt - requires anticoag for afib regardless going forward.    9.History of AICD - limited history in [facility], has not seen anyone here since 2008. 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    10. CAD - limited history available in epic - can see he had an MI with VF arrest in 2005 treated with PCI to the LCX, - statin on hold given elevated LFTs  11.LE edema - 1+bilateral LE edema, don't see fluid by rest of exam - check reds vest, add BNP to AM labs -diuretics on hold given low Na    For questions or updates, please contact [facility] Please consult [email or web address] for contact info under        Signed, Dina Rich, MD  01/21/2023, 8:24 AM

## 2023-01-21 NOTE — Plan of Care (Signed)

## 2023-01-21 NOTE — Progress Notes (Signed)
   01/21/23 0900  ReDS Vest / Clip  Station Marker D  Ruler Value 42  ReDS Value Range (!) > 40  ReDS Actual Value 47

## 2023-01-22 DIAGNOSIS — K219 Gastro-esophageal reflux disease without esophagitis: Secondary | ICD-10-CM | POA: Diagnosis not present

## 2023-01-22 DIAGNOSIS — E875 Hyperkalemia: Secondary | ICD-10-CM | POA: Diagnosis not present

## 2023-01-22 DIAGNOSIS — N1832 Chronic kidney disease, stage 3b: Secondary | ICD-10-CM | POA: Diagnosis not present

## 2023-01-22 DIAGNOSIS — I480 Paroxysmal atrial fibrillation: Secondary | ICD-10-CM

## 2023-01-22 DIAGNOSIS — I5033 Acute on chronic diastolic (congestive) heart failure: Secondary | ICD-10-CM

## 2023-01-22 DIAGNOSIS — R748 Abnormal levels of other serum enzymes: Secondary | ICD-10-CM | POA: Diagnosis not present

## 2023-01-22 DIAGNOSIS — E871 Hypo-osmolality and hyponatremia: Secondary | ICD-10-CM | POA: Diagnosis not present

## 2023-01-22 DIAGNOSIS — I2489 Other forms of acute ischemic heart disease: Secondary | ICD-10-CM

## 2023-01-22 DIAGNOSIS — I4891 Unspecified atrial fibrillation: Secondary | ICD-10-CM | POA: Diagnosis not present

## 2023-01-22 DIAGNOSIS — R7401 Elevation of levels of liver transaminase levels: Secondary | ICD-10-CM | POA: Diagnosis not present

## 2023-01-22 LAB — BASIC METABOLIC PANEL
Anion gap: 11 (ref 5–15)
BUN: 63 mg/dL — ABNORMAL HIGH (ref 8–23)
CO2: 24 mmol/L (ref 22–32)
Calcium: 8.1 mg/dL — ABNORMAL LOW (ref 8.9–10.3)
Chloride: 85 mmol/L — ABNORMAL LOW (ref 98–111)
Creatinine, Ser: 2.1 mg/dL — ABNORMAL HIGH (ref 0.61–1.24)
GFR, Estimated: 32 mL/min — ABNORMAL LOW (ref >60)
Glucose, Bld: 91 mg/dL (ref 70–99)
Potassium: 5.6 mmol/L — ABNORMAL HIGH (ref 3.5–5.1)
Sodium: 120 mmol/L — ABNORMAL LOW (ref 135–145)

## 2023-01-22 LAB — CBC
HCT: 24.7 % — ABNORMAL LOW (ref 39.0–52.0)
Hemoglobin: 7.7 g/dL — ABNORMAL LOW (ref 13.0–17.0)
MCH: 29.7 pg (ref 26.0–34.0)
MCHC: 31.2 g/dL (ref 30.0–36.0)
MCV: 95.4 fL (ref 80.0–100.0)
Platelets: 251 10*3/uL (ref 150–400)
RBC: 2.59 MIL/uL — ABNORMAL LOW (ref 4.22–5.81)
RDW: 17.2 % — ABNORMAL HIGH (ref 11.5–15.5)
WBC: 10.7 10*3/uL — ABNORMAL HIGH (ref 4.0–10.5)
nRBC: 3.1 % — ABNORMAL HIGH (ref 0.0–0.2)

## 2023-01-22 LAB — BLOOD GAS, ARTERIAL
Acid-Base Excess: 2.7 mmol/L — ABNORMAL HIGH (ref 0.0–2.0)
Bicarbonate: 27.9 mmol/L (ref 20.0–28.0)
Drawn by: 222179
O2 Saturation: 99.4 %
Patient temperature: 36.7
pCO2 arterial: 43 mmHg (ref 32–48)
pH, Arterial: 7.41 (ref 7.35–7.45)
pO2, Arterial: 109 mmHg — ABNORMAL HIGH (ref 83–108)

## 2023-01-22 LAB — PROTIME-INR
INR: 1.3 — ABNORMAL HIGH (ref 0.8–1.2)
Prothrombin Time: 16.5 s — ABNORMAL HIGH (ref 11.4–15.2)

## 2023-01-22 LAB — HEPARIN LEVEL (UNFRACTIONATED): Heparin Unfractionated: 0.31 [IU]/mL (ref 0.30–0.70)

## 2023-01-22 LAB — BRAIN NATRIURETIC PEPTIDE: B Natriuretic Peptide: 1290 pg/mL — ABNORMAL HIGH (ref 0.0–100.0)

## 2023-01-22 LAB — SODIUM, URINE, RANDOM: Sodium, Ur: <10 mmol/L

## 2023-01-22 MED ORDER — FUROSEMIDE 10 MG/ML IJ SOLN
60.0000 mg | Freq: Two times a day (BID) | INTRAMUSCULAR | Status: DC
  Administered 2023-01-22 – 2023-01-23 (×2): 60 mg via INTRAVENOUS
  Filled 2023-01-22 (×2): qty 6

## 2023-01-22 MED ORDER — BISOPROLOL FUMARATE 5 MG PO TABS
10.0000 mg | ORAL_TABLET | Freq: Every day | ORAL | Status: DC
  Administered 2023-01-22 – 2023-01-27 (×6): 10 mg via ORAL
  Filled 2023-01-22 (×6): qty 2

## 2023-01-22 MED ORDER — METOPROLOL TARTRATE 50 MG PO TABS: 50.0000 mg | ORAL_TABLET | Freq: Two times a day (BID) | ORAL | Status: DC

## 2023-01-22 MED ORDER — FUROSEMIDE 10 MG/ML IJ SOLN
20.0000 mg | Freq: Once | INTRAMUSCULAR | Status: AC
  Administered 2023-01-22: 20 mg via INTRAVENOUS
  Filled 2023-01-22: qty 2

## 2023-01-22 MED ORDER — HALOPERIDOL LACTATE 5 MG/ML IJ SOLN: 0.6000 mg | Freq: Three times a day (TID) | INTRAMUSCULAR | Status: DC | PRN

## 2023-01-22 NOTE — Progress Notes (Signed)
Subjective: Feeling okay this morning. Up at bedside eating breakfast. No abdominal pain or nausea currently. Remains without any overt GI bleeding   Objective: Vital signs in last 24 hours: Temp:  [97.5 F (36.4 C)-98.3 F (36.8 C)] 98.3 F (36.8 C) (09/06 0800) Pulse Rate:  [61-87] 67 (09/06 0523) Resp:  [13-24] 13 (09/06 0414) BP: (110-170)/(49-85) 170/82 (09/06 0900) SpO2:  [93 %-100 %] 98 % (09/06 0800) Weight:  [107.9 kg] 107.9 kg (09/06 0800) Last BM Date : 01/20/23 General:   Alert and oriented, pleasant Head:  Normocephalic and atraumatic. Eyes:  No icterus, sclera clear. Conjuctiva pink.  Mouth:  Without lesions, mucosa pink and moist.  Heart:  S1, S2 present, no murmurs noted.  Lungs: wheezing throughout  Abdomen:  Bowel sounds present, soft, non-tender, non-distended. No HSM or hernias noted. No rebound or guarding. No masses appreciated  Msk:  Symmetrical without gross deformities. Normal posture. Extremities:  Without clubbing or edema. Neurologic:  Alert and  oriented x4;  grossly normal neurologically. Skin:  Warm and dry, intact without significant lesions.  Psych:  Alert and cooperative. Normal mood and affect.  Intake/Output from previous day: 09/05 0701 - 09/06 0700 In: 1546.2 [P.O.:1070; I.V.:476.2] Out: 830 [Urine:830] Intake/Output this shift: No intake/output data recorded.  Lab Results: Recent Labs    01/20/23 0510 01/21/23 0457 01/22/23 0501  WBC 6.6 9.8 10.7*  HGB 7.5* 8.0* 7.7*  HCT 23.8* 25.9* 24.7*  PLT 252 241 251   BMET Recent Labs    01/20/23 0729 01/21/23 0457 01/22/23 0501  NA 121* 122* 120*  K 4.4 5.4* 5.6*  CL 85* 86* 85*  CO2 25 24 24   GLUCOSE 92 85 91  BUN 56* 56* 63*  CREATININE 2.16* 2.05* 2.10*  CALCIUM 7.9* 7.8* 8.1*   LFT Recent Labs    01/20/23 0729 01/21/23 0457  PROT 6.5 6.6  ALBUMIN 3.2* 3.0*  AST 331* 282*  ALT 552* 528*  ALKPHOS 671* 630*  BILITOT 1.4* 1.3*   PT/INR Recent Labs     01/19/23 1412  LABPROT 17.0*  INR 1.4*   Studies/Results: ECHOCARDIOGRAM COMPLETE  Result Date: 01/21/2023    ECHOCARDIOGRAM REPORT   Patient Name:   TAGGART WASHABAUGH Date of Exam: 01/21/2023 Medical Rec #:  [id number]      Height:       67.0 in Accession #:    [id number]     Weight:       229.3 lb Date of Birth:  [birth year]     BSA:          2.143 m Patient Age:    76 years       BP:           154/61 mmHg Patient Gender: M              HR:           66 bpm. [facility] Procedure: 2D Echo, Cardiac Doppler and Color Doppler Indications:    Syncope R55  History:        Patient has no prior history of Echocardiogram examinations.                 Acute MI, Signs/Symptoms:Shortness of Breath; Risk                 Factors:Hypertension, Dyslipidemia, Former Smoker and Sleep                 Apnea.  Sonographer:  Aron Baba Referring Phys: [id number] JAN A MANSY  Sonographer Comments: Patient is obese. Image acquisition challenging due to respiratory motion. IMPRESSIONS  1. Left ventricular ejection fraction, by estimation, is 50 to 55%. The left ventricle has low normal function. The left ventricle has no regional wall motion abnormalities. There is mild left ventricular hypertrophy. Left ventricular diastolic parameters are indeterminate.  2. Right ventricular systolic function is low normal. The right ventricular size is mildly enlarged. There is moderately elevated pulmonary artery systolic pressure.  3. Left atrial size was moderately dilated.  4. The mitral valve is abnormal. Mild to moderate mitral valve regurgitation. No evidence of mitral stenosis.  5. The tricuspid valve is abnormal. Tricuspid valve regurgitation is mild to moderate.  6. The aortic valve is tricuspid. Aortic valve regurgitation is not visualized. No aortic stenosis is present.  7. The pulmonic valve was abnormal.  8. The inferior vena cava is dilated in size with <50% respiratory variability, suggesting right atrial pressure of  15 mmHg. FINDINGS  Left Ventricle: Left ventricular ejection fraction, by estimation, is 50 to 55%. The left ventricle has low normal function. The left ventricle has no regional wall motion abnormalities. The left ventricular internal cavity size was normal in size. There is mild left ventricular hypertrophy. Left ventricular diastolic parameters are indeterminate. Right Ventricle: The right ventricular size is mildly enlarged. Right vetricular wall thickness was not well visualized. Right ventricular systolic function is low normal. There is moderately elevated pulmonary artery systolic pressure. The tricuspid regurgitant velocity is 2.99 m/s, and with an assumed right atrial pressure of 15 mmHg, the estimated right ventricular systolic pressure is 50.8 mmHg. Left Atrium: Left atrial size was moderately dilated. Right Atrium: Right atrial size was normal in size. Pericardium: There is no evidence of pericardial effusion. Mitral Valve: The mitral valve is abnormal. There is mild thickening of the mitral valve leaflet(s). There is mild calcification of the mitral valve leaflet(s). Mild mitral annular calcification. Mild to moderate mitral valve regurgitation. No evidence of mitral valve stenosis. MV peak gradient, 6.2 mmHg. The mean mitral valve gradient is 2.0 mmHg. Tricuspid Valve: The tricuspid valve is abnormal. Tricuspid valve regurgitation is mild to moderate. No evidence of tricuspid stenosis. Aortic Valve: The aortic valve is tricuspid. Aortic valve regurgitation is not visualized. No aortic stenosis is present. Aortic valve mean gradient measures 7.0 mmHg. Aortic valve peak gradient measures 15.0 mmHg. Aortic valve area, by VTI measures 1.77  cm. Pulmonic Valve: The pulmonic valve was abnormal. Pulmonic valve regurgitation is mild. No evidence of pulmonic stenosis. Aorta: The ascending aorta was not well visualized and the aortic root is normal in size and structure. Venous: The inferior vena cava is  dilated in size with less than 50% respiratory variability, suggesting right atrial pressure of 15 mmHg. IAS/Shunts: No atrial level shunt detected by color flow Doppler. Additional Comments: A device lead is visualized in the right ventricle and right atrium.  LEFT VENTRICLE PLAX 2D LVIDd:         4.70 cm   Diastology LVIDs:         3.20 cm   LV e' medial:    4.82 cm/s LV PW:         1.10 cm   LV E/e' medial:  21.6 LV IVS:        1.00 cm   LV e' lateral:   5.52 cm/s LVOT diam:     1.90 cm   LV E/e' lateral: 18.8 LV SV:  58 LV SV Index:   27 LVOT Area:     2.84 cm  RIGHT VENTRICLE RV S prime:     9.37 cm/s TAPSE (M-mode): 1.6 cm LEFT ATRIUM              Index        RIGHT ATRIUM           Index LA diam:        4.90 cm  2.29 cm/m   RA Area:     16.70 cm LA Vol (A2C):   50.5 ml  23.56 ml/m  RA Volume:   45.40 ml  21.18 ml/m LA Vol (A4C):   159.0 ml 74.19 ml/m LA Biplane Vol: 95.3 ml  44.47 ml/m  AORTIC VALVE                     PULMONIC VALVE AV Area (Vmax):    1.57 cm      PR End Diast Vel: 4.16 msec AV Area (Vmean):   1.61 cm AV Area (VTI):     1.77 cm AV Vmax:           193.43 cm/s AV Vmean:          123.260 cm/s AV VTI:            0.326 m AV Peak Grad:      15.0 mmHg AV Mean Grad:      7.0 mmHg LVOT Vmax:         107.00 cm/s LVOT Vmean:        69.800 cm/s LVOT VTI:          0.203 m LVOT/AV VTI ratio: 0.62  AORTA Ao Root diam: 3.30 cm MITRAL VALVE                 TRICUSPID VALVE MV Area (PHT): 5.23 cm      TR Peak grad:   35.8 mmHg MV Peak grad:  6.2 mmHg      TR Vmax:        299.00 cm/s MV Mean grad:  2.0 mmHg MV Vmax:       1.24 m/s      SHUNTS MV Vmean:      66.5 cm/s     Systemic VTI:  0.20 m MV Decel Time: 145 msec      Systemic Diam: 1.90 cm MR Peak grad:   103.0 mmHg MR Mean grad:   71.0 mmHg MR Vmax:        507.50 cm/s MR Vmean:       393.0 cm/s MR PISA:        0.25 cm MR PISA Radius: 0.20 cm MV E velocity: 104.00 cm/s MV A velocity: 108.00 cm/s MV E/A ratio:  0.96 Dina Rich MD  Electronically signed by Dina Rich MD Signature Date/Time: 01/21/2023/1:46:28 PM    Final    NM Hepato W/EF  Result Date: 01/20/2023 CLINICAL DATA:  Elevated liver function tests.  Pain and nausea EXAM: NUCLEAR MEDICINE HEPATOBILIARY IMAGING WITH GALLBLADDER EF TECHNIQUE: Sequential images of the abdomen were obtained out to 60 minutes following intravenous administration of radiopharmaceutical. After slow intravenous infusion of 2.18 micrograms Cholecystokinin, gallbladder ejection fraction was determined. RADIOPHARMACEUTICALS:  5.2 mCi Tc-93m Choletec IV COMPARISON:  Ultrasound 01/18/2019 FINDINGS: Prompt uptake and biliary excretion of activity by the liver is seen. Gallbladder activity is visualized, consistent with patency of cystic duct. Biliary activity passes into small bowel, consistent with patent common bile duct. Calculated  gallbladder ejection fraction is 7%. (At 60 min, normal ejection fraction is greater than 40%.) IMPRESSION: No common duct or cystic duct obstruction. Poor gallbladder ejection fraction however of only 7%. Electronically Signed   By: Karen Kays M.D.   On: 01/20/2023 13:58   US Venous Img Lower Bilateral (DVT)  Result Date: 01/20/2023 CLINICAL DATA:  Dyspnea, short of breath, lower extremity edema EXAM: BILATERAL LOWER EXTREMITY VENOUS DOPPLER ULTRASOUND TECHNIQUE: Gray-scale sonography with graded compression, as well as color Doppler and duplex ultrasound were performed to evaluate the lower extremity deep venous systems from the level of the common femoral vein and including the common femoral, femoral, profunda femoral, popliteal and calf veins including the posterior tibial, peroneal and gastrocnemius veins when visible. The superficial great saphenous vein was also interrogated. Spectral Doppler was utilized to evaluate flow at rest and with distal augmentation maneuvers in the common femoral, femoral and popliteal veins. COMPARISON:  None Available. FINDINGS: RIGHT  LOWER EXTREMITY Common Femoral Vein: No evidence of thrombus. Normal compressibility, respiratory phasicity and response to augmentation. Saphenofemoral Junction: No evidence of thrombus. Normal compressibility and flow on color Doppler imaging. Profunda Femoral Vein: No evidence of thrombus. Normal compressibility and flow on color Doppler imaging. Femoral Vein: No evidence of thrombus. Normal compressibility, respiratory phasicity and response to augmentation. Popliteal Vein: No evidence of thrombus. Normal compressibility, respiratory phasicity and response to augmentation. Calf Veins: No evidence of thrombus. Normal compressibility and flow on color Doppler imaging. Superficial Great Saphenous Vein: No evidence of thrombus. Normal compressibility. Venous Reflux:  None. Other Findings:  None. LEFT LOWER EXTREMITY Common Femoral Vein: No evidence of thrombus. Normal compressibility, respiratory phasicity and response to augmentation. Saphenofemoral Junction: No evidence of thrombus. Normal compressibility and flow on color Doppler imaging. Profunda Femoral Vein: No evidence of thrombus. Normal compressibility and flow on color Doppler imaging. Femoral Vein: No evidence of thrombus. Normal compressibility, respiratory phasicity and response to augmentation. Popliteal Vein: No evidence of thrombus. Normal compressibility, respiratory phasicity and response to augmentation. Calf Veins: No evidence of thrombus. Normal compressibility and flow on color Doppler imaging. Superficial Great Saphenous Vein: No evidence of thrombus. Normal compressibility. Venous Reflux:  None. Other Findings:  None. IMPRESSION: No evidence of deep venous thrombosis in either lower extremity. Electronically Signed   By: Malachy Moan M.D.   On: 01/20/2023 13:29    Assessment: 77 year old male admitted to the hospital for worsening shortness of breath and fatigue, found to have PE, now on Heparin drip,  GI was consulted for evaluation  of anemia and abnormal LFTs.   Elevated LFTs: Elevated alk phos since July 2024 though other LFTs have been normal.  Acute bump in LFTs on 9/2 with Peak yesterday (AST 671, ALT 692, T bili 1.3, AP 697), though aminotransferases appear to be trending back down today with AST 331, ALT 552, ALK phos 671, T bili 1.4. INR yesterday was 1.4, plt count remains normal at 252k.  Query if elevated alk phos is driven by metastatic disease to the bone, Alk phos isoenzymes were to be checked but unable to order via hospital lab. No recent hepatotoxic medication started. As LDH was elevated, could be dealing with an ischemic liver injury. Reassuringly LFTs seem to continue to trend down. No INR since 9/3, will update this today.    [place] was negative for focal liver lesions or biliary ductal dilatation though did have gallbladder sludge with mild gallbladder wall thickening and pericholecystic fluid suspicious for acute cholecystitis.  He also  endorsed substernal/upper abdominal pain rating to his back along with nausea, vomiting x 3 weeks.  HIDA scan today with dyskinesia, doubt GB is the source of his elevated LFTs  Acute hepatitis panel was negative ANA, AMA, F-Actin IgG all negative Ferritin elevated at 4258, saturation 57%(reactive?)  Hemochromatosis lab pending  CK normal at 296, troponin 987/992 CMV IgM negative, HSV1 IgG negative, HSV2 IgG positive. EBV IgG >600, CMV negative  Patient's wife reported some increased forgetfulness recently though ammonia levels have been negative. He is alert and oriented during encounter today.    Anemia: steadily declining since June 2024.  Hemoglobin 7.3 on admission. Dropped to 6.6 on 9/2, improved with transfusion, has had a total of 1 Unit PRBCs thus far. Hgb stable in 7-8 range for the past few days. no overt GI bleeding at all throughout admission.  Will plan for EGD and colonoscopy as outpatient for further evaluation as inpatient procedures will need to be delayed due  to other acute issues.     Plan: PPI BID Continue to trend LFTs Outpatient EGD and Colonoscopy  Monitor for Overt GI bleeding Continue with supportive measures  Update INR today    LOS: 4 days    01/22/2023, 9:37 AM  Shukri Nistler L. Jeanmarie Hubert, MSN, APRN, AGNP-C Adult-Gerontology Nurse Practitioner [facility]

## 2023-01-22 NOTE — Progress Notes (Signed)
Patient declines CPAP for tonight. Stated he didn't do well with it last night. Unit in room and told patient if his O2 level drops or he has apnea then we will put it on. He is agreeable to this plan.

## 2023-01-22 NOTE — Care Management Important Message (Signed)
Important Message  Patient Details  Name: Luis Donovan MRN: [id number] Date of Birth: [birth year]   Medicare Important Message Given:  Yes     Corey Harold 01/22/2023, 12:35 PM

## 2023-01-22 NOTE — Plan of Care (Signed)

## 2023-01-22 NOTE — Progress Notes (Signed)
Progress Note   Patient: Luis Donovan ZOX:[id number] DOB: 1946/01/20 DOA: 01/17/2023     4 DOS: the patient was seen and examined on 01/22/2023   Brief hospital admission narrative: As per H&P written by Dr. Thomes Dinning on 01/18/2023 Luis Donovan is a 77 y.o. male with medical history significant of hypertension, hyperlipidemia, known metastatic disease with unknown primary, GERD, PTSD, OSA, CVA, anemia of chronic disease who presents to the emergency department accompanied by wife due to shortness of breath which has been ongoing for several weeks, but worsened last couple of days.  Exertion and walking aggravates the shortness of breath, he also complained of fatigue and dizziness. Patient was admitted from 8/14 to 8/16 at [facility] long due to acute kidney injury secondary to decreased appetite associated with recent COVID infection.  He was discharged home with home health PT.  Patient states he has been having this shortness of breath since last discharge from the hospital.   ED Course:  In the emergency department, he was hemodynamically stable.  Workup in the ED showed normocytic anemia.  BMP showed sodium 124, potassium 5.9, chloride 86, bicarb 27, blood glucose 121, BUN 46, creatinine 2.54, AST 340, ALT 326, ALP 628, albumin 3.4, D-dimer 6.95, urinalysis was normal CT head without contrast showed chronic encephalomalacia within the posterior left parietal lobe.  No acute intracranial abnormality Chest x-ray showed no active cardiopulmonary disease Patient was started on heparin drip due to suspected pulmonary embolism and with plan to do VQ scan in the morning.  Zofran was given IV hydration was provided.  Lokelma was given. Hospitalist was asked to admit patient for further evaluation and management.  Assessment and Plan: 1-hyponatremia -Multifactorial, with concerns for SIADH age and dehydration. -Gentle fluid provided without much improvement. -Hemodynamically stable at the moment; will  hold fluids and follow electrolytes trend. -Serum osmolality was normal; patient urine osmolality 296. -Continue supportive care. -Continue holding on any further fluid resuscitation; patient receiving heparin drip we will add fluid and is status post 1 unit PRBC transfusion; patient will be initiated on Lasix. -So far sodium level has remained stable. -Continue to follow electrolytes trend. -Judicious about fluid intake and pursue treatment with diuresis. -If needed will contact nephrology service.  2-symptomatic anemia -Hemoglobin 6.9 patient reporting feeling weak and tired -Stable vital signs. -Status post 1 unit PRBC transfusion and current hemoglobin level 8.0 -Continue to follow trend. -No overt bleeding appreciated.  3-elevated D-dimer -With positive intermediate probability V/Q scan -Continue treatment with heparin drip. -Due to renal failure unable to assess CT angiogram of the chest. -Will follow lower extremity Dopplers and 2D echo at the moment.  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 -Creatinine currently 2.16 -Patient advised to maintain adequate oral hydration -Continue to follow renal function trend.  5-hyperkalemia -Improved after Lokelma given -No acute changes appreciated on telemetry -Follow-up potassium in AM.  6-transaminitis -Will follow GI service recommendation -Slightly improved this morning; continue to follow trend. 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  7-GERD -Continue PPI. -Per GI once further medically stable plan is to proceed with EGD and colonoscopy; which can happen as an  outpatient.  8-malignancy -continue outpatient follow  up with oncology and urology -per family has been approved by [facility] for PET Scan  9-generalized chronic pain -continue home analgesic regimen and further adjusted as needed.  10-chest discomfort overnight/elevated troponin -Most likely demand ischemia; not a candidate for cardiac cath -Continue to follow troponin -2D echo has demonstrated preserved ejection fraction, no regional wall motion abnormality; mild left ventricular hypertrophy and low right ventricular systolic function.  Patient with moderate elevated pulmonary artery systolic pressure. -Continue heparin drip and the use of metoprolol as dictated by cardiology service.. -Given increased swelling, mild orthopnea and concerns for interstitial edema/acute diastolic heart failure will provide IV Lasix.  11-acute diastolic heart failure -Most likely trigger by arrhythmia during transient episode of paroxysmal atrial fibrillation with RVR -IV Lasix will be initiated' closely follow electrolytes and renal function.  12-intermittent confusion -Will check ABG -Appears the patient did not use CPAP overnight. -Continue constant rotation.  13-paroxysmal atrial fibrillation -Continue the use of bisoprolol -Currently has converted to sinus rhythm -Eliquis at time of discharge for secondary prevention.  Subjective:  No chest pain, no fever, no nausea, no vomiting.  2 L nasal cannula in place; good oxygen saturation appreciated.  Patient reports no overt bleeding.  Easily falling asleep even easily aroused.  Family reported intermittent episodes of confusion throughout the day.  Physical Exam: Vitals:   01/22/23 1045 01/22/23 1200 01/22/23 1207 01/22/23 1208  BP:   132/65 132/65  Pulse:   69 (!) 27  Resp:    18  Temp:  98.1 F (36.7 C)    TempSrc:  Oral    SpO2: 100%   97%  Weight:      Height:       General exam: Afebrile, no chest pain, no nausea, no vomiting.  Falling  asleep throughout conversation and per family members intermittently confused. Respiratory system: Positive expiratory wheezing, decreased breath sounds at the bases.  No using accessory muscles. Cardiovascular system: Rate controlled; currently sinus rhythm.  No rubs, no gallops, unable to assess JVD with body habitus. Gastrointestinal system: Abdomen is obese, nondistended, soft and highly tender to palpation in mid abdomen.  Positive bowel sounds.   Central nervous system: Generally weak; no focal neurological deficits. Extremities: No cyanosis or clubbing; 1+ edema. Skin: No petechiae. Psychiatry: Mood & affect appropriate.   Data Reviewed: CBC: White blood cells 9.8, hemoglobin 8.0 and platelet count 241K Comprehensive metabolic panel: Sodium 122, potassium 5.4, chloride 86, BUN 56, creatinine 2.05, AST 282, ALT 528, alk phos 630 and GFR 33. Troponin: 987>>> 992 >>563  Family Communication: Wife at bedside.  Disposition: Status is: Inpatient Remains inpatient appropriate because: Continue treatment with heparin drip, follow GI service recommendation for further evaluation and management of his transaminitis and Follow hemoglobin trend and transfuse as needed. Also continue IV diuresis and close follow up to his renal function and electrolytes.   Planned Discharge Destination: Home  CRITICAL CARE Performed by: Vassie Loll   Total critical care time: 50 minutes  Critical care time was exclusive of separately billable procedures and treating other patients.  Critical care was necessary to treat or prevent imminent or life-threatening deterioration.  Critical care was time spent personally by me on the following activities: development of treatment plan with patient and/or surrogate as well as nursing, discussions with consultants, evaluation of patient's response to treatment, examination of patient, obtaining history from patient or surrogate, ordering and performing treatments  and interventions, ordering and review of laboratory studies, ordering and review of radiographic studies,  pulse oximetry and re-evaluation of patient's condition.     Author: Vassie Loll, MD 01/22/2023 1:55 PM  For on call review [email or web address].

## 2023-01-22 NOTE — Plan of Care (Signed)
  Problem: Education: Goal: Knowledge of General Education information will improve Description: Including pain rating scale, medication(s)/side effects and non-pharmacologic comfort measures Outcome: Progressing   Problem: Health Behavior/Discharge Planning: Goal: Ability to manage health-related needs will improve Outcome: Progressing   Problem: Clinical Measurements: Goal: Ability to maintain clinical measurements within normal limits will improve Outcome: Progressing Goal: Will remain free from infection Outcome: Progressing Goal: Diagnostic test results will improve Outcome: Progressing Goal: Respiratory complications will improve Outcome: Progressing Goal: Cardiovascular complication will be avoided Outcome: Progressing   Problem: Activity: Goal: Risk for activity intolerance will decrease Outcome: Progressing   Problem: Elimination: Goal: Will not experience complications related to urinary retention Outcome: Progressing   Problem: Pain Managment: Goal: General experience of comfort will improve Outcome: Progressing

## 2023-01-22 NOTE — Plan of Care (Signed)
  Problem: Education: Goal: Knowledge of General Education information will improve Description: Including pain rating scale, medication(s)/side effects and non-pharmacologic comfort measures Outcome: Progressing   Problem: Health Behavior/Discharge Planning: Goal: Ability to manage health-related needs will improve Outcome: Progressing   Problem: Clinical Measurements: Goal: Will remain free from infection Outcome: Progressing Goal: Cardiovascular complication will be avoided Outcome: Progressing   Problem: Activity: Goal: Risk for activity intolerance will decrease Outcome: Progressing   Problem: Nutrition: Goal: Adequate nutrition will be maintained Outcome: Progressing   Problem: Elimination: Goal: Will not experience complications related to bowel motility Outcome: Progressing   Problem: Safety: Goal: Ability to remain free from injury will improve Outcome: Progressing   Problem: Skin Integrity: Goal: Risk for impaired skin integrity will decrease Outcome: Progressing

## 2023-01-22 NOTE — Progress Notes (Addendum)
Progress Note  Patient Name: Luis Donovan Date of Encounter: 01/22/2023  Primary Cardiologist: Follows at [facility]  Subjective   Reports he feels a whole lot better than when he first came in. Denies any pain anywhere. Knows name, location, year but not month (said October). Does endorse some tightness of his abdomen. Quiet wheezing also present intermittently.  Have asked nurse via chat to get f/u manual BP. Initially 97 this AM then cuff was having trouble inflating/deflating with subsequent result 161/101 so unclear accuracy. Await reading.  Inpatient Medications    Scheduled Meds:  Chlorhexidine Gluconate Cloth  6 each Topical Q0600   feeding supplement  237 mL Oral BID BM   furosemide  40 mg Intravenous Q12H   metoprolol tartrate  25 mg Oral Q6H   multivitamin with minerals  1 tablet Oral Daily   oxyCODONE  10 mg Oral Q12H   pantoprazole  40 mg Oral BID   polyethylene glycol  17 g Oral BID   traZODone  50 mg Oral QHS   Continuous Infusions:  heparin 1,900 Units/hr (01/22/23 0617)   PRN Meds: acetaminophen **OR** acetaminophen, albuterol, alum & mag hydroxide-simeth, alum & mag hydroxide-simeth, levalbuterol, morphine injection, nitroGLYCERIN, oxyCODONE, prochlorperazine   Vital Signs    Vitals:   01/22/23 0300 01/22/23 0400 01/22/23 0414 01/22/23 0523  BP:   (!) 146/54   Pulse: 61 64 63 67  Resp: 19 13 13    Temp:   98.1 F (36.7 C)   TempSrc:   Oral   SpO2: 100% 100% 100%   Weight:      Height:        Intake/Output Summary (Last 24 hours) at 01/22/2023 0817 Last data filed at 01/22/2023 0617 Gross per 24 hour  Intake 1546.17 ml  Output 830 ml  Net 716.17 ml      01/18/2023    4:00 AM 01/17/2023   10:16 PM 01/13/2023    9:47 AM  Last 3 Weights  Weight (lbs) 229 lb 4.5 oz 228 lb 14.4 oz 228 lb 14.4 oz  Weight (kg) 104 kg 103.828 kg 103.828 kg     Telemetry    NSR, rare PVC - Personally Reviewed  Physical Exam   GEN: No acute distress.  HEENT:  Normocephalic, atraumatic, sclera non-icteric. Neck: No JVD or bruits. Cardiac: RRR no murmurs, rubs, or gallops.  Respiratory: Good air movement but scattered expirational wheezing. Breathing is unlabored. GI: Somewhat distended/tight, BS +x 4. No rebound or guarding. MS: no deformity. Extremities: No clubbing or cyanosis. Trace sockline edema. Distal pedal pulses are 2+ and equal bilaterally. Neuro:  A+O to self, place, 2024. Follows commands. Psych:  Responds to questions appropriately with a normal affect.  Labs    High Sensitivity Troponin:   Recent Labs  Lab 01/20/23 0510 01/20/23 0729 01/21/23 0457  TROPONINIHS 987* 992* 563*      Cardiac EnzymesNo results for input(s): "TROPONINI" in the last 168 hours. No results for input(s): "TROPIPOC" in the last 168 hours.   Chemistry Recent Labs  Lab 01/19/23 0720 01/20/23 0729 01/21/23 0457 01/22/23 0501  NA 120* 121* 122* 120*  K 5.3* 4.4 5.4* 5.6*  CL 83* 85* 86* 85*  CO2 24 25 24 24   GLUCOSE 85 92 85 91  BUN 55* 56* 56* 63*  CREATININE 2.70* 2.16* 2.05* 2.10*  CALCIUM 7.8* 7.9* 7.8* 8.1*  PROT 6.9 6.5 6.6  --   ALBUMIN 3.4* 3.2* 3.0*  --   AST 671* 331* 282*  --  ALT 692* 552* 528*  --   ALKPHOS 697* 671* 630*  --   BILITOT 1.3* 1.4* 1.3*  --   GFRNONAA 24* 31* 33* 32*  ANIONGAP 13 11 12 11      Hematology Recent Labs  Lab 01/20/23 0510 01/21/23 0457 01/22/23 0501  WBC 6.6 9.8 10.7*  RBC 2.49* 2.66* 2.59*  HGB 7.5* 8.0* 7.7*  HCT 23.8* 25.9* 24.7*  MCV 95.6 97.4 95.4  MCH 30.1 30.1 29.7  MCHC 31.5 30.9 31.2  RDW 16.4* 17.2* 17.2*  PLT 252 241 251    BNP Recent Labs  Lab 01/22/23 0501  BNP 1,290.0*     DDimer  Recent Labs  Lab 01/18/23 0105  DDIMER 6.95*     Radiology    ECHOCARDIOGRAM COMPLETE  Result Date: 01/21/2023    ECHOCARDIOGRAM REPORT   Patient Name:   Luis Donovan Date of Exam: 01/21/2023 Medical Rec #:  [id number]      Height:       67.0 in Accession #:    [id number]      Weight:       229.3 lb Date of Birth:  1945/05/20     BSA:          2.143 m Patient Age:    76 years       BP:           154/61 mmHg Patient Gender: M              HR:           66 bpm. [facility] Procedure: 2D Echo, Cardiac Doppler and Color Doppler Indications:    Syncope R55  History:        Patient has no prior history of Echocardiogram examinations.                 Acute MI, Signs/Symptoms:Shortness of Breath; Risk                 Factors:Hypertension, Dyslipidemia, Former Smoker and Sleep                 Apnea.  Sonographer:    Aron Baba Referring Phys: [id number] JAN A MANSY  Sonographer Comments: Patient is obese. Image acquisition challenging due to respiratory motion. IMPRESSIONS  1. Left ventricular ejection fraction, by estimation, is 50 to 55%. The left ventricle has low normal function. The left ventricle has no regional wall motion abnormalities. There is mild left ventricular hypertrophy. Left ventricular diastolic parameters are indeterminate.  2. Right ventricular systolic function is low normal. The right ventricular size is mildly enlarged. There is moderately elevated pulmonary artery systolic pressure.  3. Left atrial size was moderately dilated.  4. The mitral valve is abnormal. Mild to moderate mitral valve regurgitation. No evidence of mitral stenosis.  5. The tricuspid valve is abnormal. Tricuspid valve regurgitation is mild to moderate.  6. The aortic valve is tricuspid. Aortic valve regurgitation is not visualized. No aortic stenosis is present.  7. The pulmonic valve was abnormal.  8. The inferior vena cava is dilated in size with <50% respiratory variability, suggesting right atrial pressure of 15 mmHg. FINDINGS  Left Ventricle: Left ventricular ejection fraction, by estimation, is 50 to 55%. The left ventricle has low normal function. The left ventricle has no regional wall motion abnormalities. The left ventricular internal cavity size was normal in size. There is  mild left ventricular hypertrophy. Left ventricular diastolic parameters are indeterminate. Right Ventricle: The  right ventricular size is mildly enlarged. Right vetricular wall thickness was not well visualized. Right ventricular systolic function is low normal. There is moderately elevated pulmonary artery systolic pressure. The tricuspid regurgitant velocity is 2.99 m/s, and with an assumed right atrial pressure of 15 mmHg, the estimated right ventricular systolic pressure is 50.8 mmHg. Left Atrium: Left atrial size was moderately dilated. Right Atrium: Right atrial size was normal in size. Pericardium: There is no evidence of pericardial effusion. Mitral Valve: The mitral valve is abnormal. There is mild thickening of the mitral valve leaflet(s). There is mild calcification of the mitral valve leaflet(s). Mild mitral annular calcification. Mild to moderate mitral valve regurgitation. No evidence of mitral valve stenosis. MV peak gradient, 6.2 mmHg. The mean mitral valve gradient is 2.0 mmHg. Tricuspid Valve: The tricuspid valve is abnormal. Tricuspid valve regurgitation is mild to moderate. No evidence of tricuspid stenosis. Aortic Valve: The aortic valve is tricuspid. Aortic valve regurgitation is not visualized. No aortic stenosis is present. Aortic valve mean gradient measures 7.0 mmHg. Aortic valve peak gradient measures 15.0 mmHg. Aortic valve area, by VTI measures 1.77  cm. Pulmonic Valve: The pulmonic valve was abnormal. Pulmonic valve regurgitation is mild. No evidence of pulmonic stenosis. Aorta: The ascending aorta was not well visualized and the aortic root is normal in size and structure. Venous: The inferior vena cava is dilated in size with less than 50% respiratory variability, suggesting right atrial pressure of 15 mmHg. IAS/Shunts: No atrial level shunt detected by color flow Doppler. Additional Comments: A device lead is visualized in the right ventricle and right atrium.  LEFT VENTRICLE PLAX  2D LVIDd:         4.70 cm   Diastology LVIDs:         3.20 cm   LV e' medial:    4.82 cm/s LV PW:         1.10 cm   LV E/e' medial:  21.6 LV IVS:        1.00 cm   LV e' lateral:   5.52 cm/s LVOT diam:     1.90 cm   LV E/e' lateral: 18.8 LV SV:         58 LV SV Index:   27 LVOT Area:     2.84 cm  RIGHT VENTRICLE RV S prime:     9.37 cm/s TAPSE (M-mode): 1.6 cm LEFT ATRIUM              Index        RIGHT ATRIUM           Index LA diam:        4.90 cm  2.29 cm/m   RA Area:     16.70 cm LA Vol (A2C):   50.5 ml  23.56 ml/m  RA Volume:   45.40 ml  21.18 ml/m LA Vol (A4C):   159.0 ml 74.19 ml/m LA Biplane Vol: 95.3 ml  44.47 ml/m  AORTIC VALVE                     PULMONIC VALVE AV Area (Vmax):    1.57 cm      PR End Diast Vel: 4.16 msec AV Area (Vmean):   1.61 cm AV Area (VTI):     1.77 cm AV Vmax:           193.43 cm/s AV Vmean:          123.260 cm/s AV VTI:  0.326 m AV Peak Grad:      15.0 mmHg AV Mean Grad:      7.0 mmHg LVOT Vmax:         107.00 cm/s LVOT Vmean:        69.800 cm/s LVOT VTI:          0.203 m LVOT/AV VTI ratio: 0.62  AORTA Ao Root diam: 3.30 cm MITRAL VALVE                 TRICUSPID VALVE MV Area (PHT): 5.23 cm      TR Peak grad:   35.8 mmHg MV Peak grad:  6.2 mmHg      TR Vmax:        299.00 cm/s MV Mean grad:  2.0 mmHg MV Vmax:       1.24 m/s      SHUNTS MV Vmean:      66.5 cm/s     Systemic VTI:  0.20 m MV Decel Time: 145 msec      Systemic Diam: 1.90 cm MR Peak grad:   103.0 mmHg MR Mean grad:   71.0 mmHg MR Vmax:        507.50 cm/s MR Vmean:       393.0 cm/s MR PISA:        0.25 cm MR PISA Radius: 0.20 cm MV E velocity: 104.00 cm/s MV A velocity: 108.00 cm/s MV E/A ratio:  0.96 Dina Rich MD Electronically signed by Dina Rich MD Signature Date/Time: 01/21/2023/1:46:28 PM    Final    NM Hepato W/EF  Result Date: 01/20/2023 CLINICAL DATA:  Elevated liver function tests.  Pain and nausea EXAM: NUCLEAR MEDICINE HEPATOBILIARY IMAGING WITH GALLBLADDER EF TECHNIQUE:  Sequential images of the abdomen were obtained out to 60 minutes following intravenous administration of radiopharmaceutical. After slow intravenous infusion of 2.18 micrograms Cholecystokinin, gallbladder ejection fraction was determined. RADIOPHARMACEUTICALS:  5.2 mCi Tc-63m Choletec IV COMPARISON:  Ultrasound 01/18/2019 FINDINGS: Prompt uptake and biliary excretion of activity by the liver is seen. Gallbladder activity is visualized, consistent with patency of cystic duct. Biliary activity passes into small bowel, consistent with patent common bile duct. Calculated gallbladder ejection fraction is 7%. (At 60 min, normal ejection fraction is greater than 40%.) IMPRESSION: No common duct or cystic duct obstruction. Poor gallbladder ejection fraction however of only 7%. Electronically Signed   By: Karen Kays M.D.   On: 01/20/2023 13:58   US Venous Img Lower Bilateral (DVT)  Result Date: 01/20/2023 CLINICAL DATA:  Dyspnea, short of breath, lower extremity edema EXAM: BILATERAL LOWER EXTREMITY VENOUS DOPPLER ULTRASOUND TECHNIQUE: Gray-scale sonography with graded compression, as well as color Doppler and duplex ultrasound were performed to evaluate the lower extremity deep venous systems from the level of the common femoral vein and including the common femoral, femoral, profunda femoral, popliteal and calf veins including the posterior tibial, peroneal and gastrocnemius veins when visible. The superficial great saphenous vein was also interrogated. Spectral Doppler was utilized to evaluate flow at rest and with distal augmentation maneuvers in the common femoral, femoral and popliteal veins. COMPARISON:  None Available. FINDINGS: RIGHT LOWER EXTREMITY Common Femoral Vein: No evidence of thrombus. Normal compressibility, respiratory phasicity and response to augmentation. Saphenofemoral Junction: No evidence of thrombus. Normal compressibility and flow on color Doppler imaging. Profunda Femoral Vein: No evidence  of thrombus. Normal compressibility and flow on color Doppler imaging. Femoral Vein: No evidence of thrombus. Normal compressibility, respiratory phasicity and response to augmentation. Popliteal Vein: No  evidence of thrombus. Normal compressibility, respiratory phasicity and response to augmentation. Calf Veins: No evidence of thrombus. Normal compressibility and flow on color Doppler imaging. Superficial Great Saphenous Vein: No evidence of thrombus. Normal compressibility. Venous Reflux:  None. Other Findings:  None. LEFT LOWER EXTREMITY Common Femoral Vein: No evidence of thrombus. Normal compressibility, respiratory phasicity and response to augmentation. Saphenofemoral Junction: No evidence of thrombus. Normal compressibility and flow on color Doppler imaging. Profunda Femoral Vein: No evidence of thrombus. Normal compressibility and flow on color Doppler imaging. Femoral Vein: No evidence of thrombus. Normal compressibility, respiratory phasicity and response to augmentation. Popliteal Vein: No evidence of thrombus. Normal compressibility, respiratory phasicity and response to augmentation. Calf Veins: No evidence of thrombus. Normal compressibility and flow on color Doppler imaging. Superficial Great Saphenous Vein: No evidence of thrombus. Normal compressibility. Venous Reflux:  None. Other Findings:  None. IMPRESSION: No evidence of deep venous thrombosis in either lower extremity. Electronically Signed   By: Malachy Moan M.D.   On: 01/20/2023 13:29    Cardiac Studies   2d echo 01/21/23   1. Left ventricular ejection fraction, by estimation, is 50 to 55%. The  left ventricle has low normal function. The left ventricle has no regional  wall motion abnormalities. There is mild left ventricular hypertrophy.  Left ventricular diastolic  parameters are indeterminate.   2. Right ventricular systolic function is low normal. The right  ventricular size is mildly enlarged. There is moderately  elevated  pulmonary artery systolic pressure.   3. Left atrial size was moderately dilated.   4. The mitral valve is abnormal. Mild to moderate mitral valve  regurgitation. No evidence of mitral stenosis.   5. The tricuspid valve is abnormal. Tricuspid valve regurgitation is mild  to moderate.   6. The aortic valve is tricuspid. Aortic valve regurgitation is not  visualized. No aortic stenosis is present.   7. The pulmonic valve was abnormal.   8. The inferior vena cava is dilated in size with <50% respiratory  variability, suggesting right atrial pressure of 15 mmHg.    Patient Profile     Luis Donovan is a 77 y.o. 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 He was admitted with exertional SOB, fatigue, and dizziness. Multiple abnormalities seen including severe hyponatremia, AKI on CKD, elevated LFTs, anemia requiring transfusion, possible PE, ongoing malignancy of unclear primary. Cardiology asked to see for afib and elevated troponin.   Assessment & Plan    1. Paroxysmal atrial fibrillation - per 9/5 note record review showed PAF by prior device check and on Eliquis PTA - continue hparin drip, closer to transition will need to transition to DOAC but will defer management to primary team whether he will need PE dosing temporarily before returning to afib dosing - converted to NSR on beta blocker, being maintained on metoprolol 25mg  q6hr but missed 1 of 4 doses yesterday - some wheezing this AM, will await MD input on whether needing to convert to more selective BB or hold course   2. Elevated troponin - in setting of afib with RVR, anemia, acute liver inflammation, possible PE - peak trop 992, trending down - suspect most likely demand ischemia from other acute processes, less likely primary  cardiac etiology - in general not a cath candidate given malignancy,  renal failure, anemia - recommend to keep Hgb at 8, manage other acute systemic processses. No plans for ischemic testing at this time - echo with low normal EF 50-55% Without RWMA, + mild RVE, moderate pulm HTN, mild-moderate MR, mild-moderate TR    3.AKI on CKD stage 3a (Cr 1.4-1.5 range previously) with ongoing hyperkalemia - management per primary team, may need ongoing Lokelma - avoid potassium-increasing agents    4. Hyponatremia -from primary team note concern for SIADH, hypovolemia   5. Elevated LFTs - per primary team and GI   6. Anemia - Hgb down to 6.9, transfused 1 unit, here at 7.7 today - consider repeat transfusion to keep Hgb above 8.0 given CAD   7.Malignancy - from heme notes: T angio C/A/P performed for chest/back pain. Scan showed mediastinal adenopathy is noted as well as extensive periaortic and right iliac adenopathy is noted most consistent with metastatic disease or possibly lymphoma. Also noted are diffuse sclerotic densities throughout the visualized skeleton consistent with osseous metastases.Mild right hydroureteronephrosis is noted which appears to be due to occlusion from irregular mass involving the posterior portion of the urinary bladder in the region of the right ureterovesical junction.  - plans for biopsy of bladder mass according to notes (?timing)   8.Possible PE - intermediate VQ scan. LE venous dopplers were negative - avoiding CT with contrast due to renal dysfunction - conitnue heparin gtt - requires anticoag for afib regardless going forward, but will need decision making on DOAC dosing once transitioned to oral   9.History of AICD - limited history in [facility], has not seen anyone here since 2008. Reports has been followed by [facility] cardiology in [place] - can see in records he had an MI with VF arrest in 2005 treated with PCI to the LCX, AICD apparently implanted after  for secondary prevention - he reports followed by [facility] cardiology who has been monitoring his AICD  - from [place] records device placed 2005, replaced 2015 - will need to keep therapies in mind if code status changes   10. CAD - limited history available in epic - can see he had an MI with VF arrest in 2005 treated with PCI to the LCX, - statin on hold given elevated LFTs - hold off ASA given issues with anemia this admission and plan for DOAC at DC - beta blocker as above   11.LE edema, abdominal distension, possible acute on chronic HFpEF (on torsemide PTA) - minor sockline LE edema but abdomen feels somewhat tight this morning - consider nephrology evaluation given challenges with renal insufficiency, hyponatremia making diuresis difficult - started on scheduled IV Lasix 40mg  BID by primary team (Got 20mg  9/4, 40mg  9/5) - will order strict I/O's and daily weights  For questions or updates, please contact [facility] Please consult [email or web address] for contact info under Cardiology/STEMI.  Signed, Laurann Montana, PA-C 01/22/2023, 8:17 AM    Attending note  Patient seen and discussed with PA Dunn, I agree with her documentation above.   1.[facility], mention PAF prevoiusly noted by device check. Has been on eliquis at home.  -CHADS2Vasc score is at least 4 (age, HTN, prior CVA). ALready on anticoag for suspected PE - continue hep gtt, closer to D/C transition to DOAC, follow H&H given anemia    -yesterday started oral lopressor 25mg  q6hrs. He converted to NSR. Transition to lopressor 50mg  bid starting with PM dose. Some wheezing on exam, if persistent could transition to bisoprolol  2.Elevated troponin - in setting of afib with RVR, anemia, acute liver inflammation, possible PE - peak trop 992, trending down - suspect most likely demand ischemia from other acute processes, less likely primary cardiac etiology. In general not a cath candidate given malignancy, renal  failure, anemia - keep Hgb at 8, manage other acute systemic processses. No plans for ishcemic testing at this time - echo LVEF 50-55%, no WMAs, indet diastolic function, mod LAE, low normal RV function       3.AKI on CKD - per primary team     4. Hyponatremia -from primary team note concern for SIADH, hypovolemia   5. Elevated LFTs - per primary team and GI   6. Anemia - Hgb down to 6.9, transfused 1 unit   7.Malignancy - from heme notes: T angio C/A/P performed for chest/back pain. Scan showed mediastinal adenopathy is noted as well as extensive periaortic and right iliac adenopathy is noted most consistent with metastatic disease or possibly lymphoma. Also noted are diffuse sclerotic densities throughout the visualized skeleton consistent with osseous metastases.Mild right hydroureteronephrosis is noted which appears to be due to occlusion from irregular mass involving the posterior portion of the urinary bladder in the region of the right ureterovesical junction.  - plans for biopsy of bladder mass according to notes   8.Possible PE - intermediate VQ scan. LE venous dopplers were negative - avoiding CT with contrast due to renal dysfunction - obtain LE venous US, if DVT present would increase probability of PE. - conitnue heparin gtt - requires anticoag for afib regardless going forward.    9.History of AICD - limited history in [facility], has not seen anyone here since 2008. Reports has been followed by [facility] cardiology in [place] - can see in records he had an MI with VF arrest in 2005 treated with PCI to the LCX, AICD apparently implanted after for secondary prevention - he reports followed by [facility] cardiology who has been monitoring his AICD, will request records   - from [facility] records device placed 2005, replaced 2015.    10. CAD - limited history available in epic - can see he had an MI with VF arrest in 2005 treated with PCI to the LCX, - statin on hold given elevated  LFTs   11.Acute on chronic HFpEF - echo LVE 50-55%, no WMAs, indet diastolic function, mod LAE, low normal RV function - increaseing LE edema, reds vest 47% yesterday, BNP up to 1290.Marland Kitchen DIuretics have been held initially due to hyponatremia, patient also has been transfused - started on IV lasix, plans for 40mg  bid today. COntinue to monitor renal function and Na. Current liver injury would raise risks of trial of tolvaptan  - continue IV diuretic today, by charting +776mL yesterday, increase lasix dose to 60mg  bid. If not able to diurese due to sodium would need to consider neprhology consultation.

## 2023-01-22 NOTE — Progress Notes (Signed)
ANTICOAGULATION CONSULT NOTE   Pharmacy Consult for heparin Indication: pulmonary embolus  No Known Allergies  Patient Measurements: Height: 5\' 7"  (170.2 cm) Weight: 107.9 kg (237 lb 14 oz) IBW/kg (Calculated) : 66.1 Heparin Dosing Weight: 89 kg  Vital Signs: Temp: 98.3 F (36.8 C) (09/06 0800) Temp Source: Oral (09/06 0800) BP: 170/82 (09/06 0900) Pulse Rate: 67 (09/06 0523)  Labs: Recent Labs    01/19/23 1412 01/19/23 1559 01/20/23 0510 01/20/23 0729 01/21/23 0457 01/22/23 0501  HGB  --    < > 7.5*  --  8.0* 7.7*  HCT  --   --  23.8*  --  25.9* 24.7*  PLT  --   --  252  --  241 251  LABPROT 17.0*  --   --   --   --   --   INR 1.4*  --   --   --   --   --   HEPARINUNFRC  --    < > 0.44  --  0.36 0.31  CREATININE  --   --   --  2.16* 2.05* 2.10*  CKTOTAL  --   --  296  --   --   --   TROPONINIHS  --   --  987* 992* 563*  --    < > = values in this interval not displayed.    Estimated Creatinine Clearance: 36.1 mL/min (A) (by C-G formula based on SCr of 2.1 mg/dL (H)).   Medical History: Past Medical History:  Diagnosis Date   Anemia    CVA (cerebral vascular accident) (HCC) 07/17/2013   Empyema (HCC)    Erectile dysfunction due to arterial insufficiency    Essential (primary) hypertension    Gastro-esophageal reflux disease with esophagitis    Head injury    Heart disease    Hyperlipidemia, unspecified    Hypertension    Obesity, unspecified    Pneumonia    Post-traumatic stress disorder, unspecified    Sleep apnea, unspecified    Assessment: 38 yoM presented to the ED with SOB, cough/fatigue/dizziness and generalized not feeling well s/p COVID infection. Patient with elevated d-dimer at 6.95 with concerns for pulmonary embolism. Med rec shows on apixaban PTA. Pharmacy consulted to dose heparin for PE with positive intermediate probability V/Q scan.  Date Time aPTT/HL Rate/Comment 0905 0457 0.36  Therapeutic 0906 0501 0.31  Therapeutic   Goal of  Therapy:  Heparin level 0.3-0.7 units/ml Monitor platelets by anticoagulation protocol: Yes   Plan:  Continue heparin infusion at 1900 units/hr Check HL next with AM labs Continue to monitor CBC daily while on heparin infusion.   Will M. Dareen Piano, PharmD Clinical Pharmacist 01/22/2023 9:09 AM

## 2023-01-23 DIAGNOSIS — E871 Hypo-osmolality and hyponatremia: Secondary | ICD-10-CM | POA: Diagnosis not present

## 2023-01-23 DIAGNOSIS — I482 Chronic atrial fibrillation, unspecified: Secondary | ICD-10-CM

## 2023-01-23 DIAGNOSIS — K219 Gastro-esophageal reflux disease without esophagitis: Secondary | ICD-10-CM | POA: Diagnosis not present

## 2023-01-23 DIAGNOSIS — N179 Acute kidney failure, unspecified: Secondary | ICD-10-CM | POA: Diagnosis not present

## 2023-01-23 DIAGNOSIS — E875 Hyperkalemia: Secondary | ICD-10-CM | POA: Diagnosis not present

## 2023-01-23 LAB — COMPREHENSIVE METABOLIC PANEL
ALT: 312 U/L — ABNORMAL HIGH (ref 0–44)
AST: 168 U/L — ABNORMAL HIGH (ref 15–41)
Albumin: 3.2 g/dL — ABNORMAL LOW (ref 3.5–5.0)
Alkaline Phosphatase: 588 U/L — ABNORMAL HIGH (ref 38–126)
Anion gap: 12 (ref 5–15)
BUN: 65 mg/dL — ABNORMAL HIGH (ref 8–23)
CO2: 24 mmol/L (ref 22–32)
Calcium: 8.2 mg/dL — ABNORMAL LOW (ref 8.9–10.3)
Chloride: 85 mmol/L — ABNORMAL LOW (ref 98–111)
Creatinine, Ser: 2.08 mg/dL — ABNORMAL HIGH (ref 0.61–1.24)
GFR, Estimated: 33 mL/min — ABNORMAL LOW (ref >60)
Glucose, Bld: 87 mg/dL (ref 70–99)
Potassium: 5.7 mmol/L — ABNORMAL HIGH (ref 3.5–5.1)
Sodium: 121 mmol/L — ABNORMAL LOW (ref 135–145)
Total Bilirubin: 1.4 mg/dL — ABNORMAL HIGH (ref 0.3–1.2)
Total Protein: 6.6 g/dL (ref 6.5–8.1)

## 2023-01-23 LAB — PROTIME-INR
INR: 1.2 (ref 0.8–1.2)
Prothrombin Time: 15.5 s — ABNORMAL HIGH (ref 11.4–15.2)

## 2023-01-23 LAB — HEPARIN LEVEL (UNFRACTIONATED)
Heparin Unfractionated: 0.18 [IU]/mL — ABNORMAL LOW (ref 0.30–0.70)
Heparin Unfractionated: 0.57 [IU]/mL (ref 0.30–0.70)

## 2023-01-23 LAB — TSH: TSH: 3.136 u[IU]/mL (ref 0.350–4.500)

## 2023-01-23 LAB — CBC
HCT: 24.2 % — ABNORMAL LOW (ref 39.0–52.0)
Hemoglobin: 7.7 g/dL — ABNORMAL LOW (ref 13.0–17.0)
MCH: 30.6 pg (ref 26.0–34.0)
MCHC: 31.8 g/dL (ref 30.0–36.0)
MCV: 96 fL (ref 80.0–100.0)
Platelets: 250 10*3/uL (ref 150–400)
RBC: 2.52 MIL/uL — ABNORMAL LOW (ref 4.22–5.81)
RDW: 18 % — ABNORMAL HIGH (ref 11.5–15.5)
WBC: 9.2 10*3/uL (ref 4.0–10.5)
nRBC: 3.6 % — ABNORMAL HIGH (ref 0.0–0.2)

## 2023-01-23 LAB — OSMOLALITY, URINE: Osmolality, Ur: 214 mOsm/kg — ABNORMAL LOW (ref 300–900)

## 2023-01-23 LAB — CORTISOL: Cortisol, Plasma: 15.8 ug/dL

## 2023-01-23 LAB — OSMOLALITY: Osmolality: 280 mosm/kg (ref 275–295)

## 2023-01-23 MED ORDER — ALBUMIN HUMAN 25 % IV SOLN
12.5000 g | Freq: Once | INTRAVENOUS | Status: AC
  Administered 2023-01-24: 12.5 g via INTRAVENOUS
  Filled 2023-01-23: qty 50

## 2023-01-23 MED ORDER — STERILE WATER FOR INJECTION IJ SOLN
INTRAMUSCULAR | Status: AC
  Administered 2023-01-23: 10 mL
  Filled 2023-01-23: qty 10

## 2023-01-23 MED ORDER — METOLAZONE 5 MG PO TABS
5.0000 mg | ORAL_TABLET | Freq: Once | ORAL | Status: AC
  Administered 2023-01-23: 5 mg via ORAL
  Filled 2023-01-23: qty 1

## 2023-01-23 MED ORDER — FUROSEMIDE 10 MG/ML IJ SOLN
80.0000 mg | Freq: Once | INTRAMUSCULAR | Status: AC
  Administered 2023-01-24: 80 mg via INTRAVENOUS
  Filled 2023-01-23: qty 8

## 2023-01-23 MED ORDER — HYDROMORPHONE HCL 1 MG/ML IJ SOLN: 0.5000 mg | INTRAMUSCULAR | Status: DC | PRN

## 2023-01-23 MED ORDER — PATIROMER SORBITEX CALCIUM 8.4 G PO PACK
16.8000 g | PACK | Freq: Every day | ORAL | Status: DC
  Administered 2023-01-23: 16.8 g via ORAL
  Filled 2023-01-23 (×3): qty 2

## 2023-01-23 MED ORDER — ACETAZOLAMIDE SODIUM 500 MG IJ SOLR
500.0000 mg | Freq: Once | INTRAMUSCULAR | Status: AC
  Administered 2023-01-23: 500 mg via INTRAVENOUS
  Filled 2023-01-23: qty 500

## 2023-01-23 MED ORDER — FUROSEMIDE 10 MG/ML IJ SOLN
80.0000 mg | Freq: Once | INTRAMUSCULAR | Status: AC
  Administered 2023-01-23: 80 mg via INTRAVENOUS
  Filled 2023-01-23: qty 8

## 2023-01-23 MED ORDER — ALBUMIN HUMAN 25 % IV SOLN
12.5000 g | Freq: Once | INTRAVENOUS | Status: AC
  Administered 2023-01-23: 12.5 g via INTRAVENOUS
  Filled 2023-01-23: qty 50

## 2023-01-23 MED ORDER — HEPARIN BOLUS VIA INFUSION
2700.0000 [IU] | Freq: Once | INTRAVENOUS | Status: AC
  Administered 2023-01-23: 2700 [IU] via INTRAVENOUS
  Filled 2023-01-23: qty 2700

## 2023-01-23 NOTE — Progress Notes (Signed)
Progress Note   Patient: Luis Donovan ZOX:[id number] DOB: [birth year] DOA: 01/17/2023     5 DOS: the patient was seen and examined on 01/23/2023   Brief hospital admission narrative: As per H&P written by Dr. Thomes Dinning on 01/18/2023 Luis Donovan is a 77 y.o. male with medical history significant of hypertension, hyperlipidemia, known metastatic disease with unknown primary, GERD, PTSD, OSA, CVA, anemia of chronic disease who presents to the emergency department accompanied by wife due to shortness of breath which has been ongoing for several weeks, but worsened last couple of days.  Exertion and walking aggravates the shortness of breath, he also complained of fatigue and dizziness. Patient was admitted from 8/14 to 8/16 at [facility] long due to acute kidney injury secondary to decreased appetite associated with recent COVID infection.  He was discharged home with home health PT.  Patient states he has been having this shortness of breath since last discharge from the hospital.   ED Course:  In the emergency department, he was hemodynamically stable.  Workup in the ED showed normocytic anemia.  BMP showed sodium 124, potassium 5.9, chloride 86, bicarb 27, blood glucose 121, BUN 46, creatinine 2.54, AST 340, ALT 326, ALP 628, albumin 3.4, D-dimer 6.95, urinalysis was normal CT head without contrast showed chronic encephalomalacia within the posterior left parietal lobe.  No acute intracranial abnormality Chest x-ray showed no active cardiopulmonary disease Patient was started on heparin drip due to suspected pulmonary embolism and with plan to do VQ scan in the morning.  Zofran was given IV hydration was provided.  Lokelma was given. Hospitalist was asked to admit patient for further evaluation and management.  Assessment and Plan: 1-hyponatremia -Multifactorial, with concerns for SIADH age and dehydration. -Gentle fluid provided without much improvement. -Hemodynamically stable at the moment; will  hold fluids and follow electrolytes trend. -Serum osmolality was normal; patient urine osmolality 296. -Continue supportive care. -Continue holding on any further fluid resuscitation; patient receiving heparin drip we will add fluid and is status post 1 unit PRBC transfusion; patient will be initiated on Lasix. -So far sodium level has remained stable. -Continue to follow electrolytes trend. -Continue to be judicious about fluid intake and pursuit treatment with diuresis.  2-symptomatic anemia -Hemoglobin 6.9 patient reporting feeling weak and tired -Stable vital signs. -Status post 1 unit PRBC transfusion and current hemoglobin level 7.7 -Continue to follow trend and further transfuse as needed. -No overt bleeding appreciated.  3-elevated D-dimer -With positive intermediate probability V/Q scan -Continue treatment with heparin drip. -Due to renal failure unable to assess CT angiogram of the chest. -Will follow lower extremity Dopplers and 2D echo at the moment.  4-acute on chronic renal failure; with hyponatremia and fluid overload -Patient with a stage IIIb at baseline -Presented with worsening creatinine and a GFR down to 24 -Continue minimizing nephrotoxic agents, the use of contrast and avoiding hypotension. -Creatinine currently 2.08 -Patient advised to maintain adequate oral hydration -Nephrology service has been consulted and will follow recommendations. -Renal ultrasound has been ordered; continue adjusted dosage of IV diuresis, albumin and acetazolamide; metolazone x 1 dose provided on 01/23/2023.  5-hyperkalemia -Improved after Lokelma given -No acute changes appreciated on telemetry -Follow-up potassium in AM. -Renal diet.  6-transaminitis -Will follow GI service recommendation -Slightly improved this morning; continue to follow trend. 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  7-GERD -Continue PPI. -Per GI once further medically stable plan is to proceed with EGD and colonoscopy; which can happen as an outpatient.  8-malignancy -continue outpatient follow up with oncology and urology -per family has been approved by [facility] for PET Scan  9-generalized chronic pain -continue home analgesic regimen and further adjust as needed. -Use saline worsening kidney disease  10-chest discomfort overnight/elevated troponin -Most likely demand ischemia; not a candidate for cardiac cath -Continue to follow troponin -2D echo has demonstrated preserved ejection fraction, no regional wall motion abnormality; mild left ventricular hypertrophy and low right ventricular systolic function.  Patient with moderate elevated pulmonary artery systolic pressure. -Continue heparin drip and the use of metoprolol as dictated by cardiology service.. -Continue IV diuresis as mentioned below. -Denies chest pain.  11-acute diastolic heart failure -Most likely trigger by arrhythmia during transient episode of paroxysmal atrial fibrillation with RVR. -Continue IV diuresis -Given electrolytes abnormalities and worsening renal function nephrology service has been consulted -Continue daily weights, strict I's and O's and fluid restrictions.  12-intermittent confusion -ABG demonstrating stable CO2 levels. -Continue CPAP nightly -Continue constant reorientation and supportive care.   -As needed low-dose Haldol will be available.  13-paroxysmal atrial fibrillation -Continue the use of bisoprolol -Currently has converted to sinus rhythm and his rate is controlled. -Eliquis at time of discharge for secondary prevention.  Subjective:  Overnight experiencing episodes of confusion/restlessness; today following commands appropriately.  Overall feeling better, out of bed and sitting on bedside chair expressing to be  hungry and wanting something to eat.  No fever.  Physical Exam: Vitals:   01/23/23 0900 01/23/23 1111 01/23/23 1300 01/23/23 1615  BP: 114/75  134/74   Pulse: 60  (!) 58   Resp:   16   Temp:  97.8 F (36.6 C)  97.7 F (36.5 C)  TempSrc:  Oral  Oral  SpO2: (!) 40%  (!) 86%   Weight:      Height:       General exam: Alert, awake, oriented x 3; following commands appropriately and reporting no chest pain.  He is hungry and would like diet advance. Respiratory system: Decreased breath sounds at the bases; positive fine crackles.  2 L nasal cannula in place. Cardiovascular system: Rate controlled, sinus rhythm appreciated on telemetry.  No rubs or gallops.  Unable to assess JVD with body habitus. Gastrointestinal system: Abdomen is obese, mildly tender to the palpation in lower abdomen.  Positive bowel sounds.  No guarding. Central nervous system: No focal neurological deficits. Extremities: No cyanosis or clubbing.  2+ edema appreciated bilaterally. Skin: No petechiae. Psychiatry: Judgement and insight appear normal. Mood & affect appropriate.   Data Reviewed: CBC: White blood cells 9.2, hemoglobin 7.7, platelet count 250K TSH: 3.136 Serum osmolality 280 Comprehensive metabolic panel: Sodium 121, potassium 5.7, chloride 85, bicarb 24, BUN 65, creatinine 2.08, AST 168, ALT 312, alk phos 588 and GFR 33. Cortisol level: 15.8 Troponin: 987>>> 992 >>563  Family Communication: Wife and daughter at bedside.  Disposition: Status is: Inpatient Remains inpatient appropriate because: Continue treatment with heparin drip, follow GI service recommendation for further evaluation and management of his transaminitis and Follow hemoglobin trend and transfuse as needed. Also continue IV diuresis and close follow up to his renal function and electrolytes.   Planned Discharge Destination: Home  CRITICAL CARE Performed by: Vassie Loll   Total critical care time: 55 minutes  Critical care time  was exclusive of separately billable procedures and  treating other patients.  Critical care was necessary to treat or prevent imminent or life-threatening deterioration.  Critical care was time spent personally by me on the following activities: development of treatment plan with patient and/or surrogate as well as nursing, discussions with consultants, evaluation of patient's response to treatment, examination of patient, obtaining history from patient or surrogate, ordering and performing treatments and interventions, ordering and review of laboratory studies, ordering and review of radiographic studies, pulse oximetry and re-evaluation of patient's condition.     Author: Vassie Loll, MD 01/23/2023 6:15 PM  For on call review [email or web address].

## 2023-01-23 NOTE — Plan of Care (Signed)
  Problem: Education: Goal: Knowledge of General Education information will improve Description Including pain rating scale, medication(s)/side effects and non-pharmacologic comfort measures Outcome: Progressing   Problem: Health Behavior/Discharge Planning: Goal: Ability to manage health-related needs will improve Outcome: Progressing   Problem: Clinical Measurements: Goal: Ability to maintain clinical measurements within normal limits will improve Outcome: Progressing Goal: Diagnostic test results will improve Outcome: Progressing Goal: Respiratory complications will improve Outcome: Progressing Goal: Cardiovascular complication will be avoided Outcome: Progressing   Problem: Activity: Goal: Risk for activity intolerance will decrease Outcome: Progressing   Problem: Nutrition: Goal: Adequate nutrition will be maintained Outcome: Progressing   Problem: Coping: Goal: Level of anxiety will decrease Outcome: Progressing

## 2023-01-23 NOTE — Progress Notes (Signed)
Patient placed on full face mask auto CPAP with 2L oxygen titrated in.  Patient tolerating well at this time. O2 sats 93%

## 2023-01-23 NOTE — Consult Note (Signed)
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  He was recently discharged from [facility] after a brief admission between 8/14 and 8/16 with acute kidney injury that is suspected to be prerenal with preceding COVID infection.  He presented to the emergency room 5 days ago with a history of progressively worsening shortness of breath particularly with exertion, increased pedal edema and increasing fatigue and dizziness.  He was found to have multiple abnormalities on admission including hyponatremia, hyperkalemia, acute kidney injury, elevated liver enzymes, anemia and developed atrial fibrillation with RVR during hospitalization.  He denies any prior history of acute kidney injury, nephrolithiasis or recurrent UTIs.  Denies any hematuria prior to admission but reports diminishing urine output in spite of taking his diuretics as directed.  He reportedly was following a low-sodium diet prior to admission and was not taking any nonsteroidal anti-inflammatory drugs.  He denies any prior history of hyponatremia or problems with potassium.  He denies any preceding nausea, vomiting or diarrhea.  Labs show normal TSH 3.13, urine sodium <10, urine osmolality 296 and serum osmolality 285.  Past Medical History:  Diagnosis Date   Anemia    CVA (cerebral vascular accident) (HCC) 07/17/2013   Empyema (HCC)    Erectile dysfunction due to arterial insufficiency    Essential (primary) hypertension    Gastro-esophageal reflux disease with esophagitis    Head injury    Heart disease    Hyperlipidemia, unspecified     Hypertension    Obesity, unspecified    Pneumonia    Post-traumatic stress disorder, unspecified    Sleep apnea, unspecified     Past Surgical History:  Procedure Laterality Date   CARDIAC DEFIBRILLATOR PLACEMENT     LUNG SURGERY     operation on skin of neck     ROTATOR CUFF REPAIR Left    TENDON REPAIR     VIDEO ASSISTED THORACOSCOPY      History reviewed. No pertinent family history.  Social History:  reports that he has quit smoking. He has never used smokeless tobacco. He reports that he does not drink alcohol and does not use drugs.  Allergies: No Known Allergies  Medications: I have reviewed the patient's current medications. Scheduled:  acetaZOLAMIDE  500 mg Intravenous Once   bisoprolol  10 mg Oral Daily   Chlorhexidine Gluconate Cloth  6 each Topical Q0600   feeding supplement  237 mL Oral BID BM   furosemide  80 mg Intravenous Once   [START ON 01/24/2023] furosemide  80 mg Intravenous Once   furosemide  80 mg Intravenous Once   metolazone  5 mg Oral Once   multivitamin with minerals  1 tablet Oral Daily   oxyCODONE  10 mg Oral Q12H   pantoprazole  40 mg Oral BID   patiromer  16.8 g Oral Daily   polyethylene glycol  17 g Oral BID   traZODone  50 mg Oral QHS   Continuous:  albumin human     [START ON 01/24/2023] albumin human     albumin human     heparin 1,900 Units/hr (01/23/23 1053)      Latest Ref Rng & Units 01/23/2023    4:07 AM 01/22/2023  5:01 AM 01/21/2023    4:57 AM  BMP  Glucose 70 - 99 mg/dL 87  91  85   BUN 8 - 23 mg/dL 65  63  56   Creatinine 0.61 - 1.24 mg/dL 1.91  4.78  2.95   Sodium 135 - 145 mmol/L 121  120  122   Potassium 3.5 - 5.1 mmol/L 5.7  5.6  5.4   Chloride 98 - 111 mmol/L 85  85  86   CO2 22 - 32 mmol/L 24  24  24    Calcium 8.9 - 10.3 mg/dL 8.2  8.1  7.8       Latest Ref Rng & Units 01/23/2023    4:07 AM 01/22/2023    5:01 AM 01/21/2023    4:57 AM  CBC  WBC 4.0 - 10.5 K/uL 9.2  10.7  9.8   Hemoglobin 13.0 - 17.0 g/dL 7.7  7.7   8.0   Hematocrit 39.0 - 52.0 % 24.2  24.7  25.9   Platelets 150 - 400 K/uL 250  251  241    Urinalysis    Component Value Date/Time   COLORURINE AMBER (A) 01/18/2023 0211   APPEARANCEUR HAZY (A) 01/18/2023 0211   LABSPEC 1.018 01/18/2023 0211   PHURINE 5.0 01/18/2023 0211   GLUCOSEU NEGATIVE 01/18/2023 0211   HGBUR NEGATIVE 01/18/2023 0211   BILIRUBINUR NEGATIVE 01/18/2023 0211   KETONESUR NEGATIVE 01/18/2023 0211   PROTEINUR NEGATIVE 01/18/2023 0211   UROBILINOGEN 1.0 09/18/2010 1918   NITRITE NEGATIVE 01/18/2023 0211   LEUKOCYTESUR NEGATIVE 01/18/2023 0211    ECHOCARDIOGRAM COMPLETE  Result Date: 01/21/2023    ECHOCARDIOGRAM REPORT   Patient Name:   Luis Donovan Date of Exam: 01/21/2023 Medical Rec #:  [id number]      Height:       67.0 in Accession #:    [id number]     Weight:       229.3 lb Date of Birth:  [birth year]     BSA:          2.143 m Patient Age:    76 years       BP:           154/61 mmHg Patient Gender: M              HR:           66 bpm. [facility] Procedure: 2D Echo, Cardiac Doppler and Color Doppler Indications:    Syncope R55  History:        Patient has no prior history of Echocardiogram examinations.                 Acute MI, Signs/Symptoms:Shortness of Breath; Risk                 Factors:Hypertension, Dyslipidemia, Former Smoker and Sleep                 Apnea.  Sonographer:    Aron Baba Referring Phys: [id number] JAN A MANSY  Sonographer Comments: Patient is obese. Image acquisition challenging due to respiratory motion. IMPRESSIONS  1. Left ventricular ejection fraction, by estimation, is 50 to 55%. The left ventricle has low normal function. The left ventricle has no regional wall motion abnormalities. There is mild left ventricular hypertrophy. Left ventricular diastolic parameters are indeterminate.  2. Right ventricular systolic function is low normal. The right ventricular size is mildly enlarged. There is moderately elevated pulmonary artery  systolic pressure.  3.  Left atrial size was moderately dilated.  4. The mitral valve is abnormal. Mild to moderate mitral valve regurgitation. No evidence of mitral stenosis.  5. The tricuspid valve is abnormal. Tricuspid valve regurgitation is mild to moderate.  6. The aortic valve is tricuspid. Aortic valve regurgitation is not visualized. No aortic stenosis is present.  7. The pulmonic valve was abnormal.  8. The inferior vena cava is dilated in size with <50% respiratory variability, suggesting right atrial pressure of 15 mmHg. FINDINGS  Left Ventricle: Left ventricular ejection fraction, by estimation, is 50 to 55%. The left ventricle has low normal function. The left ventricle has no regional wall motion abnormalities. The left ventricular internal cavity size was normal in size. There is mild left ventricular hypertrophy. Left ventricular diastolic parameters are indeterminate. Right Ventricle: The right ventricular size is mildly enlarged. Right vetricular wall thickness was not well visualized. Right ventricular systolic function is low normal. There is moderately elevated pulmonary artery systolic pressure. The tricuspid regurgitant velocity is 2.99 m/s, and with an assumed right atrial pressure of 15 mmHg, the estimated right ventricular systolic pressure is 50.8 mmHg. Left Atrium: Left atrial size was moderately dilated. Right Atrium: Right atrial size was normal in size. Pericardium: There is no evidence of pericardial effusion. Mitral Valve: The mitral valve is abnormal. There is mild thickening of the mitral valve leaflet(s). There is mild calcification of the mitral valve leaflet(s). Mild mitral annular calcification. Mild to moderate mitral valve regurgitation. No evidence of mitral valve stenosis. MV peak gradient, 6.2 mmHg. The mean mitral valve gradient is 2.0 mmHg. Tricuspid Valve: The tricuspid valve is abnormal. Tricuspid valve regurgitation is mild to moderate. No evidence of tricuspid  stenosis. Aortic Valve: The aortic valve is tricuspid. Aortic valve regurgitation is not visualized. No aortic stenosis is present. Aortic valve mean gradient measures 7.0 mmHg. Aortic valve peak gradient measures 15.0 mmHg. Aortic valve area, by VTI measures 1.77  cm. Pulmonic Valve: The pulmonic valve was abnormal. Pulmonic valve regurgitation is mild. No evidence of pulmonic stenosis. Aorta: The ascending aorta was not well visualized and the aortic root is normal in size and structure. Venous: The inferior vena cava is dilated in size with less than 50% respiratory variability, suggesting right atrial pressure of 15 mmHg. IAS/Shunts: No atrial level shunt detected by color flow Doppler. Additional Comments: A device lead is visualized in the right ventricle and right atrium.  LEFT VENTRICLE PLAX 2D LVIDd:         4.70 cm   Diastology LVIDs:         3.20 cm   LV e' medial:    4.82 cm/s LV PW:         1.10 cm   LV E/e' medial:  21.6 LV IVS:        1.00 cm   LV e' lateral:   5.52 cm/s LVOT diam:     1.90 cm   LV E/e' lateral: 18.8 LV SV:         58 LV SV Index:   27 LVOT Area:     2.84 cm  RIGHT VENTRICLE RV S prime:     9.37 cm/s TAPSE (M-mode): 1.6 cm LEFT ATRIUM              Index        RIGHT ATRIUM           Index LA diam:        4.90 cm  2.29 cm/m  RA Area:     16.70 cm LA Vol (A2C):   50.5 ml  23.56 ml/m  RA Volume:   45.40 ml  21.18 ml/m LA Vol (A4C):   159.0 ml 74.19 ml/m LA Biplane Vol: 95.3 ml  44.47 ml/m  AORTIC VALVE                     PULMONIC VALVE AV Area (Vmax):    1.57 cm      PR End Diast Vel: 4.16 msec AV Area (Vmean):   1.61 cm AV Area (VTI):     1.77 cm AV Vmax:           193.43 cm/s AV Vmean:          123.260 cm/s AV VTI:            0.326 m AV Peak Grad:      15.0 mmHg AV Mean Grad:      7.0 mmHg LVOT Vmax:         107.00 cm/s LVOT Vmean:        69.800 cm/s LVOT VTI:          0.203 m LVOT/AV VTI ratio: 0.62  AORTA Ao Root diam: 3.30 cm MITRAL VALVE                 TRICUSPID VALVE  MV Area (PHT): 5.23 cm      TR Peak grad:   35.8 mmHg MV Peak grad:  6.2 mmHg      TR Vmax:        299.00 cm/s MV Mean grad:  2.0 mmHg MV Vmax:       1.24 m/s      SHUNTS MV Vmean:      66.5 cm/s     Systemic VTI:  0.20 m MV Decel Time: 145 msec      Systemic Diam: 1.90 cm MR Peak grad:   103.0 mmHg MR Mean grad:   71.0 mmHg MR Vmax:        507.50 cm/s MR Vmean:       393.0 cm/s MR PISA:        0.25 cm MR PISA Radius: 0.20 cm MV E velocity: 104.00 cm/s MV A velocity: 108.00 cm/s MV E/A ratio:  0.96 Dina Rich MD Electronically signed by Dina Rich MD Signature Date/Time: 01/21/2023/1:46:28 PM    Final     Review of Systems  Constitutional:  Positive for activity change and fatigue. Negative for chills and fever.  HENT:  Negative for sore throat and trouble swallowing.   Eyes:  Negative for redness and visual disturbance.  Respiratory:  Positive for chest tightness and shortness of breath. Negative for cough.   Cardiovascular:  Positive for chest pain and leg swelling.  Gastrointestinal:  Negative for diarrhea, nausea and vomiting.  Endocrine: Negative for polyphagia and polyuria.  Genitourinary:  Positive for decreased urine volume. Negative for dysuria and hematuria.  Neurological:  Positive for weakness. Negative for dizziness and light-headedness.   Blood pressure (!) 135/55, pulse 64, temperature 98 F (36.7 C), temperature source Oral, resp. rate 13, height 5\' 7"  (1.702 m), weight 107.9 kg, SpO2 98%. Physical Exam Vitals and nursing note reviewed.  Constitutional:      General: He is not in acute distress.    Appearance: He is obese. He is not ill-appearing.  HENT:     Head: Normocephalic and atraumatic.     Right Ear: External ear normal.  Left Ear: External ear normal.     Nose: Nose normal.     Mouth/Throat:     Mouth: Mucous membranes are dry.     Pharynx: Oropharynx is clear.  Eyes:     General: No scleral icterus.    Extraocular Movements: Extraocular movements  intact.     Conjunctiva/sclera: Conjunctivae normal.  Cardiovascular:     Rate and Rhythm: Normal rate and regular rhythm.     Pulses: Normal pulses.     Heart sounds: Normal heart sounds.  Pulmonary:     Effort: Pulmonary effort is normal.     Breath sounds: Normal breath sounds. No wheezing or rales.  Abdominal:     General: Bowel sounds are normal. There is distension.     Palpations: Abdomen is soft.  Musculoskeletal:        General: Normal range of motion.     Cervical back: Normal range of motion and neck supple.     Right lower leg: Edema present.     Left lower leg: Edema present.     Comments: 3+ bilateral pitting lower extremity edema  Skin:    General: Skin is warm and dry.     Coloration: Skin is not jaundiced.  Neurological:     General: No focal deficit present.     Mental Status: He is alert and oriented to person, place, and time.     Assessment/Plan: 1.  Acute kidney injury on chronic kidney disease stage III: Appears to be hemodynamically mediated in the setting of recent acute kidney injury that may or may not have progressed to ATN.  Currently, it appears that he has significant third spacing with decreased effective arterial blood volume as verified from his low urine sodium which is even more surprising in the setting of diuretic use.  I will begin supportive management with efforts at diuresis with albumin/furosemide, acetazolamide and a single dose of metolazone.  I will order for a renal ultrasound to rule out any obstruction. Avoid nephrotoxic medications including NSAIDs and iodinated intravenous contrast exposure unless the latter is absolutely indicated.  Preferred narcotic agents for pain control are hydromorphone, fentanyl, and methadone. Morphine should not be used. Avoid Baclofen and avoid oral sodium phosphate and magnesium citrate based laxatives / bowel preps. Continue strict Input and Output monitoring. Will monitor the patient closely with you and  intervene or adjust therapy as indicated by changes in clinical status/labs. 2.  Hyponatremia: This is hypervolemic hyponatremia and I am not sure if it is true or pseudohyponatremia based on the initial serum osmolality that was completely normal.  Repeat urine osmolality and serum osmolality have been requested yesterday and are pending at this time.  Whereas it is possible that this might be paraneoplastic SIADH, I also suspect that it may be related to his volume status and acute liver injury.  Will check fasting lipid panel with labs tomorrow morning especially in the setting of acute liver injury.  Continue fluid restriction and I have ordered diuretics. 3.  Hyperkalemia: Secondary to acute kidney injury and impaired potassium handling.  Begin patiromer. 4.  Acute liver injury: Ongoing evaluation and management by gastroenterology 5.  Anemia: Symptomatic and status post PRBC transfusion, likely related to metastatic malignancy.   Dagoberto Ligas 01/23/2023, 10:56 AM

## 2023-01-23 NOTE — Progress Notes (Signed)
ANTICOAGULATION CONSULT NOTE   Pharmacy Consult for heparin Indication: pulmonary embolus  No Known Allergies  Patient Measurements: Height: 5\' 7"  (170.2 cm) Weight: 107.9 kg (237 lb 14 oz) IBW/kg (Calculated) : 66.1 Heparin Dosing Weight: 89 kg  Vital Signs: Temp: 97.7 F (36.5 C) (09/07 1615) Temp Source: Oral (09/07 1615) BP: 134/74 (09/07 1300) Pulse Rate: 73 (09/07 1900)  Labs: Recent Labs    01/21/23 0457 01/22/23 0501 01/22/23 1048 01/23/23 0407 01/23/23 2043  HGB 8.0* 7.7*  --  7.7*  --   HCT 25.9* 24.7*  --  24.2*  --   PLT 241 251  --  250  --   LABPROT  --   --  16.5* 15.5*  --   INR  --   --  1.3* 1.2  --   HEPARINUNFRC 0.36 0.31  --  0.18* 0.57  CREATININE 2.05* 2.10*  --  2.08*  --   TROPONINIHS 563*  --   --   --   --     Estimated Creatinine Clearance: 36.5 mL/min (A) (by C-G formula based on SCr of 2.08 mg/dL (H)).   Medical History: Past Medical History:  Diagnosis Date   Anemia    CVA (cerebral vascular accident) (HCC) 07/17/2013   Empyema (HCC)    Erectile dysfunction due to arterial insufficiency    Essential (primary) hypertension    Gastro-esophageal reflux disease with esophagitis    Head injury    Heart disease    Hyperlipidemia, unspecified    Hypertension    Obesity, unspecified    Pneumonia    Post-traumatic stress disorder, unspecified    Sleep apnea, unspecified    Assessment: 70 yoM presented to the ED with SOB, cough/fatigue/dizziness and generalized not feeling well s/p COVID infection. Patient with elevated d-dimer at 6.95 with concerns for pulmonary embolism. Med rec shows on apixaban PTA. Pharmacy consulted to dose heparin for PE with positive intermediate probability V/Q scan.  Date Time aPTT/HL Rate/Comment 0905 0457 0.36  Therapeutic 0906 0501 0.31  Therapeutic 0907 0407 0.18  Subtherapeutic 0907 2043 0.57  Therapeutic x 1   Goal of Therapy:  Heparin level 0.3-0.7 units/ml Monitor platelets by anticoagulation  protocol: Yes   Plan:  Continue heparin infusion at 2200 units/hr Re-check confirmatory HL with AM labs Continue to monitor CBC daily while on heparin infusion.   Will M. Dareen Piano, PharmD Clinical Pharmacist 01/23/2023 9:52 PM

## 2023-01-23 NOTE — Progress Notes (Signed)
ANTICOAGULATION CONSULT NOTE   Pharmacy Consult for heparin Indication: pulmonary embolus  No Known Allergies  Patient Measurements: Height: 5\' 7"  (170.2 cm) Weight: 107.9 kg (237 lb 14 oz) IBW/kg (Calculated) : 66.1 Heparin Dosing Weight: 89 kg  Vital Signs: Temp: 97.8 F (36.6 C) (09/07 1111) Temp Source: Oral (09/07 1111) BP: 135/55 (09/07 0800) Pulse Rate: 64 (09/07 0800)  Labs: Recent Labs    01/21/23 0457 01/22/23 0501 01/22/23 1048 01/23/23 0407  HGB 8.0* 7.7*  --  7.7*  HCT 25.9* 24.7*  --  24.2*  PLT 241 251  --  250  LABPROT  --   --  16.5* 15.5*  INR  --   --  1.3* 1.2  HEPARINUNFRC 0.36 0.31  --  0.18*  CREATININE 2.05* 2.10*  --  2.08*  TROPONINIHS 563*  --   --   --     Estimated Creatinine Clearance: 36.5 mL/min (A) (by C-G formula based on SCr of 2.08 mg/dL (H)).   Medical History: Past Medical History:  Diagnosis Date   Anemia    CVA (cerebral vascular accident) (HCC) 07/17/2013   Empyema (HCC)    Erectile dysfunction due to arterial insufficiency    Essential (primary) hypertension    Gastro-esophageal reflux disease with esophagitis    Head injury    Heart disease    Hyperlipidemia, unspecified    Hypertension    Obesity, unspecified    Pneumonia    Post-traumatic stress disorder, unspecified    Sleep apnea, unspecified    Assessment: 4 yoM presented to the ED with SOB, cough/fatigue/dizziness and generalized not feeling well s/p COVID infection. Patient with elevated d-dimer at 6.95 with concerns for pulmonary embolism. Med rec shows on apixaban PTA. Pharmacy consulted to dose heparin for PE with positive intermediate probability V/Q scan.  Date Time aPTT/HL Rate/Comment 0905 0457 0.36  Therapeutic 0906 0501 0.31  Therapeutic 0907 0407 0.18  Subtherapeutic   Goal of Therapy:  Heparin level 0.3-0.7 units/ml Monitor platelets by anticoagulation protocol: Yes   Plan:  Bolus heparin 2700 units x 1 and increase heparin infusion to  2200 units/hr Re-check HL next in 8 hours Continue to monitor CBC daily while on heparin infusion.   Will M. Dareen Piano, PharmD Clinical Pharmacist 01/23/2023 12:16 PM

## 2023-01-24 DIAGNOSIS — N1832 Chronic kidney disease, stage 3b: Secondary | ICD-10-CM | POA: Diagnosis not present

## 2023-01-24 DIAGNOSIS — N179 Acute kidney failure, unspecified: Secondary | ICD-10-CM | POA: Diagnosis not present

## 2023-01-24 DIAGNOSIS — R7401 Elevation of levels of liver transaminase levels: Secondary | ICD-10-CM | POA: Diagnosis not present

## 2023-01-24 DIAGNOSIS — E871 Hypo-osmolality and hyponatremia: Secondary | ICD-10-CM | POA: Diagnosis not present

## 2023-01-24 LAB — CBC
HCT: 26.1 % — ABNORMAL LOW (ref 39.0–52.0)
Hemoglobin: 7.9 g/dL — ABNORMAL LOW (ref 13.0–17.0)
MCH: 29.8 pg (ref 26.0–34.0)
MCHC: 30.3 g/dL (ref 30.0–36.0)
MCV: 98.5 fL (ref 80.0–100.0)
Platelets: 236 10*3/uL (ref 150–400)
RBC: 2.65 MIL/uL — ABNORMAL LOW (ref 4.22–5.81)
RDW: 18.3 % — ABNORMAL HIGH (ref 11.5–15.5)
WBC: 7.4 10*3/uL (ref 4.0–10.5)
nRBC: 4.4 % — ABNORMAL HIGH (ref 0.0–0.2)

## 2023-01-24 LAB — LIPID PANEL
Cholesterol: 84 mg/dL (ref 0–200)
HDL: 27 mg/dL — ABNORMAL LOW (ref >40)
LDL Cholesterol: 42 mg/dL (ref 0–99)
Total CHOL/HDL Ratio: 3.1 ratio
Triglycerides: 77 mg/dL (ref <150)
VLDL: 15 mg/dL (ref 0–40)

## 2023-01-24 LAB — COMPREHENSIVE METABOLIC PANEL
ALT: 296 U/L — ABNORMAL HIGH (ref 0–44)
AST: 106 U/L — ABNORMAL HIGH (ref 15–41)
Albumin: 3.4 g/dL — ABNORMAL LOW (ref 3.5–5.0)
Alkaline Phosphatase: 616 U/L — ABNORMAL HIGH (ref 38–126)
Anion gap: 11 (ref 5–15)
BUN: 62 mg/dL — ABNORMAL HIGH (ref 8–23)
CO2: 24 mmol/L (ref 22–32)
Calcium: 8.2 mg/dL — ABNORMAL LOW (ref 8.9–10.3)
Chloride: 88 mmol/L — ABNORMAL LOW (ref 98–111)
Creatinine, Ser: 1.96 mg/dL — ABNORMAL HIGH (ref 0.61–1.24)
GFR, Estimated: 35 mL/min — ABNORMAL LOW (ref >60)
Glucose, Bld: 112 mg/dL — ABNORMAL HIGH (ref 70–99)
Potassium: 3.9 mmol/L (ref 3.5–5.1)
Sodium: 123 mmol/L — ABNORMAL LOW (ref 135–145)
Total Bilirubin: 1.3 mg/dL — ABNORMAL HIGH (ref 0.3–1.2)
Total Protein: 6.6 g/dL (ref 6.5–8.1)

## 2023-01-24 LAB — HEPARIN LEVEL (UNFRACTIONATED): Heparin Unfractionated: 0.48 [IU]/mL (ref 0.30–0.70)

## 2023-01-24 LAB — PROTIME-INR
INR: 1.4 — ABNORMAL HIGH (ref 0.8–1.2)
Prothrombin Time: 17.2 s — ABNORMAL HIGH (ref 11.4–15.2)

## 2023-01-24 MED ORDER — ALBUMIN HUMAN 25 % IV SOLN
12.5000 g | Freq: Once | INTRAVENOUS | Status: AC
  Administered 2023-01-24: 12.5 g via INTRAVENOUS
  Filled 2023-01-24: qty 50

## 2023-01-24 MED ORDER — ACETAZOLAMIDE SODIUM 500 MG IJ SOLR
500.0000 mg | Freq: Once | INTRAMUSCULAR | Status: AC
  Administered 2023-01-24: 500 mg via INTRAVENOUS
  Filled 2023-01-24: qty 500

## 2023-01-24 MED ORDER — FUROSEMIDE 10 MG/ML IJ SOLN
80.0000 mg | Freq: Once | INTRAMUSCULAR | Status: AC
  Administered 2023-01-25: 80 mg via INTRAVENOUS
  Filled 2023-01-24: qty 8

## 2023-01-24 MED ORDER — METOLAZONE 5 MG PO TABS
5.0000 mg | ORAL_TABLET | Freq: Once | ORAL | Status: AC
  Administered 2023-01-24: 5 mg via ORAL
  Filled 2023-01-24: qty 1

## 2023-01-24 MED ORDER — STERILE WATER FOR INJECTION IJ SOLN
INTRAMUSCULAR | Status: AC
  Administered 2023-01-24: 10 mL
  Filled 2023-01-24: qty 10

## 2023-01-24 MED ORDER — GUAIFENESIN-DM 100-10 MG/5ML PO SYRP: 5.0000 mL | ORAL_SOLUTION | ORAL | Status: DC | PRN

## 2023-01-24 MED ORDER — FUROSEMIDE 10 MG/ML IJ SOLN
80.0000 mg | Freq: Once | INTRAMUSCULAR | Status: AC
  Administered 2023-01-24: 80 mg via INTRAVENOUS
  Filled 2023-01-24: qty 8

## 2023-01-24 MED ORDER — ALBUMIN HUMAN 25 % IV SOLN
12.5000 g | Freq: Once | INTRAVENOUS | Status: AC
  Administered 2023-01-25: 12.5 g via INTRAVENOUS
  Filled 2023-01-24: qty 50

## 2023-01-24 NOTE — Progress Notes (Signed)
ANTICOAGULATION CONSULT NOTE   Pharmacy Consult for heparin Indication: pulmonary embolus  No Known Allergies  Patient Measurements: Height: 5\' 7"  (170.2 cm) Weight: 103.1 kg (227 lb 4.7 oz) IBW/kg (Calculated) : 66.1 Heparin Dosing Weight: 89 kg  Vital Signs: Temp: 97.8 F (36.6 C) (09/08 0721) Temp Source: Oral (09/08 0721) BP: 97/76 (09/08 0643) Pulse Rate: 97 (09/08 0643)  Labs: Recent Labs    01/22/23 0501 01/22/23 1048 01/23/23 0407 01/23/23 2043 01/24/23 0338  HGB 7.7*  --  7.7*  --  7.9*  HCT 24.7*  --  24.2*  --  26.1*  PLT 251  --  250  --  236  LABPROT  --  16.5* 15.5*  --  17.2*  INR  --  1.3* 1.2  --  1.4*  HEPARINUNFRC 0.31  --  0.18* 0.57 0.48  CREATININE 2.10*  --  2.08*  --  1.96*    Estimated Creatinine Clearance: 37.8 mL/min (A) (by C-G formula based on SCr of 1.96 mg/dL (H)).   Medical History: Past Medical History:  Diagnosis Date   Anemia    CVA (cerebral vascular accident) (HCC) 07/17/2013   Empyema (HCC)    Erectile dysfunction due to arterial insufficiency    Essential (primary) hypertension    Gastro-esophageal reflux disease with esophagitis    Head injury    Heart disease    Hyperlipidemia, unspecified    Hypertension    Obesity, unspecified    Pneumonia    Post-traumatic stress disorder, unspecified    Sleep apnea, unspecified    Assessment: 78 yoM presented to the ED with SOB, cough/fatigue/dizziness and generalized not feeling well s/p COVID infection. Patient with elevated d-dimer at 6.95 with concerns for pulmonary embolism. Med rec shows on apixaban PTA. Pharmacy consulted to dose heparin for PE with positive intermediate probability V/Q scan.  Date Time aPTT/HL Rate/Comment 0905 0457 0.36  Therapeutic 0906 0501 0.31  Therapeutic 0907 0407 0.18  Subtherapeutic 0907 2043 0.57  Therapeutic x 1 0908 0338 0.48  Therapeutic x 2   Goal of Therapy:  Heparin level 0.3-0.7 units/ml Monitor platelets by anticoagulation  protocol: Yes   Plan:  Continue heparin infusion at 2200 units/hr Check heparin level next with tomorrow AM labs Continue to monitor CBC daily while on heparin infusion.   Will M. Dareen Piano, PharmD Clinical Pharmacist 01/24/2023 9:09 AM

## 2023-01-24 NOTE — Progress Notes (Signed)
PROGRESS NOTE    Luis Donovan  QIO:[id number] DOB: 26-May-1945 DOA: 01/17/2023 PCP: [facility]   Brief Narrative:    Luis Donovan is a 77 y.o. male with medical history significant of hypertension, hyperlipidemia, known metastatic disease with unknown primary, GERD, PTSD, OSA, CVA, anemia of chronic disease who presents to the emergency department accompanied by wife due to shortness of breath which has been ongoing for several weeks, but worsened last couple of days.  Exertion and walking aggravates the shortness of breath, he also complained of fatigue and dizziness. Patient was admitted from 8/14 to 8/16 at [facility] long due to acute kidney injury secondary to decreased appetite associated with recent COVID infection.  He was discharged home with home health PT.  Patient states he has been having this shortness of breath since last discharge from the hospital.  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 -Gentle fluid provided without much improvement. -Hemodynamically stable at the moment; will hold fluids and follow electrolytes trend. -Serum osmolality was normal; patient urine osmolality 296. -Continue supportive care. -Continue holding on any further fluid resuscitation; patient receiving heparin drip we will add fluid and is status post 1 unit PRBC transfusion; patient will be initiated on Lasix. -So far sodium level has remained stable. -Continue to follow electrolytes trend. -Continue to be judicious about fluid intake and pursuit treatment with diuresis.   Appreciate ongoing nephrology recommendations with plans to repeat bolus dosing of diuretics.   2-symptomatic anemia-stable -Hemoglobin 6.9 patient reporting feeling weak and tired -Stable vital signs. -Status post 1 unit PRBC transfusion and current hemoglobin level 7.7 -Continue to follow trend and further transfuse as needed. -No overt bleeding appreciated.   3-elevated D-dimer -With positive intermediate probability V/Q scan -Continue treatment with heparin drip. -Due to renal failure unable to assess CT angiogram of the chest. -Ultrasound negative for DVT t.   4-acute on chronic renal failure; with hyponatremia and fluid overload -Patient with a stage IIIb at baseline -Presented with worsening creatinine and a GFR down to 24 -Continue minimizing nephrotoxic agents, the use of contrast and avoiding hypotension. -Creatinine currently 2.08 -Patient advised to maintain adequate oral hydration -Nephrology service has been consulted and will follow recommendations. -Renal ultrasound has been ordered; continue adjusted dosage of IV diuresis, albumin and acetazolamide; metolazone x 1 dose provided on 01/23/2023.   5-hyperkalemia-improved -Improved after Lokelma given -No acute changes appreciated on telemetry -Follow-up potassium in AM. -Renal diet.   6-transaminitis -Will follow GI service recommendation -Slightly improved this morning; continue to follow trend. 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   7-GERD -Continue PPI. -Per GI once further medically stable plan is to proceed with EGD and colonoscopy; which can happen as an outpatient.   8-malignancy -continue outpatient follow up with oncology and urology -per family has been approved by [facility] for PET Scan   9-generalized chronic pain -continue home  analgesic regimen and further adjust as needed. -Use  saline worsening kidney disease   10-chest discomfort overnight/elevated troponin -Most likely demand ischemia; not a candidate for cardiac cath -Continue to follow troponin -2D echo has demonstrated preserved ejection fraction, no regional wall motion abnormality; mild left ventricular hypertrophy and low right ventricular systolic function.  Patient with moderate elevated pulmonary artery systolic pressure. -Continue heparin drip and the use of metoprolol as dictated by cardiology service.. -Continue IV diuresis as mentioned below. -Denies chest pain.   11-acute diastolic heart failure -Most likely trigger by arrhythmia during transient episode of paroxysmal atrial fibrillation with RVR. -Continue IV diuresis -Given electrolytes abnormalities and worsening renal function nephrology service has been consulted -Continue daily weights, strict I's and O's and fluid restrictions.   12-intermittent confusion -ABG demonstrating stable CO2 levels. -Continue CPAP nightly -Continue constant reorientation and supportive care.   -As needed low-dose Haldol will be available.   13-paroxysmal atrial fibrillation -Continue the use of bisoprolol -Currently has converted to sinus rhythm and his rate is controlled. -Eliquis at time of discharge for secondary prevention.  14-Obesity -BMI 35.6    DVT prophylaxis: Heparin drip Code Status: Full Family Communication: None at bedside Disposition Plan:  Status is: Inpatient Remains inpatient appropriate because: Requires ongoing IV medications.  Consultants:  Nephrology GI Cardiology  Procedures:  None  Antimicrobials:  Anti-infectives (From admission, onward)    None       Subjective: Patient seen and evaluated today with no new acute complaints or concerns. No acute concerns or events noted overnight.  He continues to have aggressive diuresis.  Objective: Vitals:   01/24/23  0500 01/24/23 0502 01/24/23 0643 01/24/23 0721  BP: (!) 143/97  97/76   Pulse: (!) 107 98 97   Resp: (!) 27 15 16    Temp:    97.8 F (36.6 C)  TempSrc:    Oral  SpO2: (!) 89% 94% 100%   Weight: 103.1 kg     Height:        Intake/Output Summary (Last 24 hours) at 01/24/2023 0824 Last data filed at 01/24/2023 0932 Gross per 24 hour  Intake 1232.71 ml  Output 4375 ml  Net -3142.29 ml   Filed Weights   01/22/23 0800 01/23/23 0500 01/24/23 0500  Weight: 107.9 kg 107.9 kg 103.1 kg    Examination:  General exam: Appears calm and comfortable  Respiratory system: Clear to auscultation. Respiratory effort normal.  3 L nasal cannula. Cardiovascular system: S1 & S2 heard, irregular rate and rhythm Gastrointestinal system: Abdomen is soft Central nervous system: Alert and awake Extremities: Pitting edema bilaterally Skin: No significant lesions noted Psychiatry: Flat affect.    Data Reviewed: I have personally reviewed following labs and imaging studies  CBC: Recent Labs  Lab 01/18/23 0105 01/18/23 1117 01/20/23 0510 01/21/23 0457 01/22/23 0501 01/23/23 0407 01/24/23 0338  WBC 8.6   < > 6.6 9.8 10.7* 9.2 7.4  NEUTROABS 5.8  --   --   --   --   --   --   HGB 7.0*   < > 7.5* 8.0* 7.7* 7.7* 7.9*  HCT 22.4*   < > 23.8* 25.9* 24.7* 24.2* 26.1*  MCV 97.0   < > 95.6 97.4 95.4 96.0 98.5  PLT 344   < > 252 241 251 250 236   < > = values in this interval not displayed.   Basic Metabolic Panel: Recent Labs  Lab 01/18/23 3557 01/18/23 1117 01/20/23 0510 01/20/23 0729 01/21/23 0457 01/22/23 0501 01/23/23 0407 01/24/23 0338  NA  --    < >  --  121* 122* 120* 121* 123*  K  --    < >  --  4.4 5.4* 5.6* 5.7* 3.9  CL  --    < >  --  85* 86* 85* 85* 88*  CO2  --    < >  --  25 24 24 24 24   GLUCOSE  --    < >  --  92 85 91 87 112*  BUN  --    < >  --  56* 56* 63* 65* 62*  CREATININE  --    < >  --  2.16* 2.05* 2.10* 2.08* 1.96*  CALCIUM  --    < >  --  7.9* 7.8* 8.1* 8.2* 8.2*   MG 3.4*  --  3.6*  --   --   --   --   --   PHOS 4.0  --   --   --   --   --   --   --    < > = values in this interval not displayed.   GFR: Estimated Creatinine Clearance: 37.8 mL/min (A) (by C-G formula based on SCr of 1.96 mg/dL (H)). Liver Function Tests: Recent Labs  Lab 01/19/23 0720 01/20/23 0729 01/21/23 0457 01/23/23 0407 01/24/23 0338  AST 671* 331* 282* 168* 106*  ALT 692* 552* 528* 312* 296*  ALKPHOS 697* 671* 630* 588* 616*  BILITOT 1.3* 1.4* 1.3* 1.4* 1.3*  PROT 6.9 6.5 6.6 6.6 6.6  ALBUMIN 3.4* 3.2* 3.0* 3.2* 3.4*   Recent Labs  Lab 01/18/23 0105  LIPASE 37   Recent Labs  Lab 01/19/23 1412  AMMONIA 20   Coagulation Profile: Recent Labs  Lab 01/19/23 1412 01/22/23 1048 01/23/23 0407 01/24/23 0338  INR 1.4* 1.3* 1.2 1.4*   Cardiac Enzymes: Recent Labs  Lab 01/20/23 0510  CKTOTAL 296   BNP (last 3 results) No results for input(s): "PROBNP" in the last 8760 hours. HbA1C: No results for input(s): "HGBA1C" in the last 72 hours. CBG: Recent Labs  Lab 01/21/23 1242  GLUCAP 88   Lipid Profile: Recent Labs    01/24/23 0338  CHOL 84  HDL 27*  LDLCALC 42  TRIG 77  CHOLHDL 3.1   Thyroid Function Tests: Recent Labs    01/23/23 0407  TSH 3.136   Anemia Panel: No results for input(s): "VITAMINB12", "FOLATE", "FERRITIN", "TIBC", "IRON", "RETICCTPCT" in the last 72 hours. Sepsis Labs: No results for input(s): "PROCALCITON", "LATICACIDVEN" in the last 168 hours.  Recent Results (from the past 240 hour(s))  MRSA Next Gen by PCR, Nasal     Status: None   Collection Time: 01/18/23  4:22 AM   Specimen: Nasal Mucosa; Nasal Swab  Result Value Ref Range Status   MRSA by PCR Next Gen NOT DETECTED NOT DETECTED Final    Comment: (NOTE) The GeneXpert MRSA Assay (FDA approved for NASAL specimens only), is one component of a comprehensive MRSA colonization surveillance program. It is not intended to diagnose MRSA infection nor to guide or  monitor treatment for MRSA infections. Test performance is not FDA approved in patients less than 53 years old. Performed at [facility], [address] [place], [place] [address]          Radiology Studies: No results found.      Scheduled Meds:  bisoprolol  10 mg Oral Daily   Chlorhexidine Gluconate Cloth  6 each Topical Q0600   feeding supplement  237 mL Oral BID BM   multivitamin with  minerals  1 tablet Oral Daily   oxyCODONE  10 mg Oral Q12H   pantoprazole  40 mg Oral BID   polyethylene glycol  17 g Oral BID   traZODone  50 mg Oral QHS   Continuous Infusions:  heparin 2,200 Units/hr (01/24/23 0137)     LOS: 6 days    Time spent: 35 minutes    Dael Howland Hoover Brunette, DO [facility]  If 7PM-7AM, please contact night-coverage [email or web address] 01/24/2023, 8:24 AM

## 2023-01-24 NOTE — Progress Notes (Signed)
Patient ID: CHISUM WHEELAND, male   DOB: January 13, 1946, 77 y.o.   MRN: [id number] The patient's chart was reviewed remotely and found significant for good response to diuretics overnight (3.2 L urine output charted with a corresponding weight loss of 4.8 kg).  Potassium improved/corrected with Veltassa and diuretics and sodium level improving.  I will repeat the same bolus dosing of diuretics as was ordered yesterday with albumin/furosemide, acetazolamide and metolazone.  This plan was communicated to Dr. Sherryll Burger.  Zetta Bills MD [facility]. Office # [phone] Pager # [phone] 10:24 AM

## 2023-01-24 NOTE — Progress Notes (Signed)
Gastroenterology & Hepatology   Interval History: Patient seen and examined in ICU bedside this afternoon, denies any abdominal pain, had 1 formed brown bowel movement in past 24 hours  Inpatient Medications:  Current Facility-Administered Medications:    acetaminophen (TYLENOL) tablet 650 mg, 650 mg, Oral, Q6H PRN, 650 mg at 01/20/23 2002 **OR** acetaminophen (TYLENOL) suppository 650 mg, 650 mg, Rectal, Q6H PRN, Adefeso, Oladapo, DO   [START ON 01/25/2023] albumin human 25 % solution 12.5 g, 12.5 g, Intravenous, Once **FOLLOWED BY** [START ON 01/25/2023] furosemide (LASIX) injection 80 mg, 80 mg, Intravenous, Once, Allena Katz, Hartley Barefoot, MD   albumin human 25 % solution 12.5 g, 12.5 g, Intravenous, Once **FOLLOWED BY** furosemide (LASIX) injection 80 mg, 80 mg, Intravenous, Once, Allena Katz, Hartley Barefoot, MD   albuterol (PROVENTIL) (2.5 MG/3ML) 0.083% nebulizer solution 2.5 mg, 2.5 mg, Inhalation, Q4H PRN, Adefeso, Oladapo, DO   alum & mag hydroxide-simeth (MAALOX/MYLANTA) 200-200-20 MG/5ML suspension 30 mL, 30 mL, Oral, Q6H PRN, Vassie Loll, MD, 30 mL at 01/19/23 1415   alum & mag hydroxide-simeth (MAALOX/MYLANTA) 200-200-20 MG/5ML suspension 30 mL, 30 mL, Oral, Q4H PRN, Mansy, Jan A, MD, 30 mL at 01/20/23 0441   bisoprolol (ZEBETA) tablet 10 mg, 10 mg, Oral, Daily, Vassie Loll, MD, 10 mg at 01/24/23 0931   Chlorhexidine Gluconate Cloth 2 % PADS 6 each, 6 each, Topical, Q0600, Adefeso, Oladapo, DO, 6 each at 01/24/23 0507   feeding supplement (ENSURE ENLIVE / ENSURE PLUS) liquid 237 mL, 237 mL, Oral, BID BM, Adefeso, Oladapo, DO, 237 mL at 01/23/23 1051   [COMPLETED] albumin human 25 % solution 12.5 g, 12.5 g, Intravenous, Once, Last Rate: 60 mL/hr at 01/24/23 1354, 12.5 g at 01/24/23 1354 **FOLLOWED BY** furosemide (LASIX) injection 80 mg, 80 mg, Intravenous, Once, Dagoberto Ligas, MD   haloperidol lactate (HALDOL) injection 0.6 mg, 0.6 mg, Intravenous, Q8H PRN, Vassie Loll, MD   heparin ADULT infusion 100  units/mL (25000 units/243mL), 2,200 Units/hr, Intravenous, Continuous, Orson Aloe, [facility], Last Rate: 22 mL/hr at 01/24/23 1043, 2,200 Units/hr at 01/24/23 1043   HYDROmorphone (DILAUDID) injection 0.5 mg, 0.5 mg, Intravenous, Q4H PRN, Vassie Loll, MD   levalbuterol Pauline Aus) nebulizer solution 0.63 mg, 0.63 mg, Nebulization, Q8H PRN, Vassie Loll, MD, 0.63 mg at 01/22/23 1045   multivitamin with minerals tablet 1 tablet, 1 tablet, Oral, Daily, Vassie Loll, MD, 1 tablet at 01/24/23 0931   nitroGLYCERIN (NITROSTAT) SL tablet 0.4 mg, 0.4 mg, Sublingual, Q5 min PRN, Mansy, Jan A, MD, 0.4 mg at 01/20/23 5638   oxyCODONE (Oxy IR/ROXICODONE) immediate release tablet 10 mg, 10 mg, Oral, Q4H PRN, Adefeso, Oladapo, DO, 10 mg at 01/22/23 1728   oxyCODONE (OXYCONTIN) 12 hr tablet 10 mg, 10 mg, Oral, Q12H, Adefeso, Oladapo, DO, 10 mg at 01/24/23 0930   pantoprazole (PROTONIX) EC tablet 40 mg, 40 mg, Oral, BID, Vassie Loll, MD, 40 mg at 01/24/23 0931   polyethylene glycol (MIRALAX / GLYCOLAX) packet 17 g, 17 g, Oral, BID, Adefeso, Oladapo, DO, 17 g at 01/24/23 0931   prochlorperazine (COMPAZINE) injection 10 mg, 10 mg, Intravenous, Q6H PRN, Adefeso, Oladapo, DO, 10 mg at 01/18/23 0910   traZODone (DESYREL) tablet 50 mg, 50 mg, Oral, QHS, Adefeso, Oladapo, DO, 50 mg at 01/23/23 2230   I/O    Intake/Output Summary (Last 24 hours) at 01/24/2023 1514 Last data filed at 01/24/2023 1430 Gross per 24 hour  Intake 1661.74 ml  Output 6775 ml  Net -5113.26 ml     Physical Exam:  Temp:  [97.6 F (36.4 C)-98 F (36.7 C)] 97.8 F (36.6 C) (09/08 1149) Pulse Rate:  [61-107] 98 (09/08 1150) Resp:  [12-27] 21 (09/08 1150) BP: (92-143)/(66-97) 141/66 (09/08 1150) SpO2:  [89 %-100 %] 100 % (09/08 1150) FiO2 (%):  [28 %] 28 % (09/07 2235) Weight:  [103.1 kg] 103.1 kg (09/08 0500)  Temp (24hrs), Avg:97.8 F (36.6 C), Min:97.6 F (36.4 C), Max:98 F (36.7 C)  GENERAL: The patient is AO x3, in no  acute distress. HEENT: Head is normocephalic and atraumatic. EOMI are intact. Mouth is well hydrated and without lesions. NECK: Supple. No masses LUNGS: Clear to auscultation. No presence of rhonchi/wheezing/rales. Adequate chest expansion HEART: RRR, normal s1 and s2. ABDOMEN: Soft, nontender, no guarding, no peritoneal signs, and nondistended. BS +. No masses.  Laboratory Data: CBC:     Component Value Date/Time   WBC 7.4 01/24/2023 0338   RBC 2.65 (L) 01/24/2023 0338   HGB 7.9 (L) 01/24/2023 0338   HGB 7.3 (L) 01/13/2023 1039   HCT 26.1 (L) 01/24/2023 0338   PLT 236 01/24/2023 0338   PLT 296 01/13/2023 1039   MCV 98.5 01/24/2023 0338   MCH 29.8 01/24/2023 0338   MCHC 30.3 01/24/2023 0338   RDW 18.3 (H) 01/24/2023 0338   LYMPHSABS 1.7 01/18/2023 0105   MONOABS 0.5 01/18/2023 0105   EOSABS 0.1 01/18/2023 0105   BASOSABS 0.0 01/18/2023 0105   COAG:  Lab Results  Component Value Date   INR 1.4 (H) 01/24/2023   INR 1.2 01/23/2023   INR 1.3 (H) 01/22/2023    BMP:     Latest Ref Rng & Units 01/24/2023    3:38 AM 01/23/2023    4:07 AM 01/22/2023    5:01 AM  BMP  Glucose 70 - 99 mg/dL 132  87  91   BUN 8 - 23 mg/dL 62  65  63   Creatinine 0.61 - 1.24 mg/dL 4.40  1.02  7.25   Sodium 135 - 145 mmol/L 123  121  120   Potassium 3.5 - 5.1 mmol/L 3.9  5.7  5.6   Chloride 98 - 111 mmol/L 88  85  85   CO2 22 - 32 mmol/L 24  24  24    Calcium 8.9 - 10.3 mg/dL 8.2  8.2  8.1     HEPATIC:     Latest Ref Rng & Units 01/24/2023    3:38 AM 01/23/2023    4:07 AM 01/21/2023    4:57 AM  Hepatic Function  Total Protein 6.5 - 8.1 g/dL 6.6  6.6  6.6   Albumin 3.5 - 5.0 g/dL 3.4  3.2  3.0   AST 15 - 41 U/L 106  168  282   ALT 0 - 44 U/L 296  312  528   Alk Phosphatase 38 - 126 U/L 616  588  630   Total Bilirubin 0.3 - 1.2 mg/dL 1.3  1.4  1.3     CARDIAC:  Lab Results  Component Value Date   CKTOTAL 296 01/20/2023      Imaging: I personally reviewed and interpreted the available labs,  imaging and endoscopic files.   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  GI is following the patient for anemia without overt  bleed and elevated liver enzymes in setting of A-fib with RVR  #Elevated liver enzymes  Trending down   No overt bleeding reported.  No liver enzymes checked today.  Elevation liver enzymes are multifactorial with congestive hepatopathy(elevated proBNP 1200) and metastatic disease to the bone.  Negative HIDA scan .  Following workup also negative except elevated ferritin level which can be seen in acute phase reactant.    Acute viral hepatitis panel was negative ANA, AMA, F-Actin IgG all negative Ferritin elevated at 4258, saturation 57%(reactive?)  Hemochromatosis lab pending  CK normal at 296, CMV IgM negative, HSV1 IgG negative, HSV2 IgG positive. EBV IgG >600, CMV negative   -recommend daily CMP and INR  #Anemia without overt bleeding   Stool remains brown and formed   Hemoglobin 7.3 on admission. Dropped to 6.6 on 9/2, improved with transfusion, has had a total of 1 Unit PRBCs thus far. Hgb stable in 7-8 range    -As discussed previoulsy with the patient's family members to pursue bidirectional endoscopy as outpatient once acute issues are resolved, unless or or significant GI bleeding is witnessed and patient     Vista Lawman, MD Gastroenterology and Hepatology [facility]   This chart has been completed using [facility] Dictation software, and while attempts have been made to ensure accuracy , certain words and phrases may not be transcribed as intended

## 2023-01-25 DIAGNOSIS — E871 Hypo-osmolality and hyponatremia: Secondary | ICD-10-CM | POA: Diagnosis not present

## 2023-01-25 DIAGNOSIS — I4891 Unspecified atrial fibrillation: Secondary | ICD-10-CM | POA: Diagnosis not present

## 2023-01-25 LAB — CBC
HCT: 26.2 % — ABNORMAL LOW (ref 39.0–52.0)
Hemoglobin: 8.1 g/dL — ABNORMAL LOW (ref 13.0–17.0)
MCH: 30.6 pg (ref 26.0–34.0)
MCHC: 30.9 g/dL (ref 30.0–36.0)
MCV: 98.9 fL (ref 80.0–100.0)
Platelets: 232 10*3/uL (ref 150–400)
RBC: 2.65 MIL/uL — ABNORMAL LOW (ref 4.22–5.81)
RDW: 19.2 % — ABNORMAL HIGH (ref 11.5–15.5)
WBC: 7 10*3/uL (ref 4.0–10.5)
nRBC: 2.6 % — ABNORMAL HIGH (ref 0.0–0.2)

## 2023-01-25 LAB — HEPARIN LEVEL (UNFRACTIONATED): Heparin Unfractionated: 0.57 [IU]/mL (ref 0.30–0.70)

## 2023-01-25 LAB — COMPREHENSIVE METABOLIC PANEL
ALT: 209 U/L — ABNORMAL HIGH (ref 0–44)
AST: 56 U/L — ABNORMAL HIGH (ref 15–41)
Albumin: 3.5 g/dL (ref 3.5–5.0)
Alkaline Phosphatase: 595 U/L — ABNORMAL HIGH (ref 38–126)
Anion gap: 14 (ref 5–15)
BUN: 58 mg/dL — ABNORMAL HIGH (ref 8–23)
CO2: 27 mmol/L (ref 22–32)
Calcium: 8.4 mg/dL — ABNORMAL LOW (ref 8.9–10.3)
Chloride: 87 mmol/L — ABNORMAL LOW (ref 98–111)
Creatinine, Ser: 1.94 mg/dL — ABNORMAL HIGH (ref 0.61–1.24)
GFR, Estimated: 36 mL/min — ABNORMAL LOW (ref >60)
Glucose, Bld: 96 mg/dL (ref 70–99)
Potassium: 3.3 mmol/L — ABNORMAL LOW (ref 3.5–5.1)
Sodium: 128 mmol/L — ABNORMAL LOW (ref 135–145)
Total Bilirubin: 1.2 mg/dL (ref 0.3–1.2)
Total Protein: 6.6 g/dL (ref 6.5–8.1)

## 2023-01-25 LAB — MAGNESIUM: Magnesium: 3.1 mg/dL — ABNORMAL HIGH (ref 1.7–2.4)

## 2023-01-25 MED ORDER — METOPROLOL TARTRATE 5 MG/5ML IV SOLN
2.5000 mg | Freq: Once | INTRAVENOUS | Status: AC
  Administered 2023-01-25: 2.5 mg via INTRAVENOUS
  Filled 2023-01-25: qty 5

## 2023-01-25 MED ORDER — APIXABAN 5 MG PO TABS
5.0000 mg | ORAL_TABLET | Freq: Two times a day (BID) | ORAL | Status: DC
  Administered 2023-01-25 – 2023-01-27 (×5): 5 mg via ORAL
  Filled 2023-01-25 (×5): qty 1

## 2023-01-25 MED ORDER — POTASSIUM CHLORIDE CRYS ER 20 MEQ PO TBCR
40.0000 meq | EXTENDED_RELEASE_TABLET | Freq: Two times a day (BID) | ORAL | Status: AC
  Administered 2023-01-25 (×2): 40 meq via ORAL
  Filled 2023-01-25 (×2): qty 2

## 2023-01-25 NOTE — Plan of Care (Signed)
  Problem: Education: Goal: Knowledge of General Education information will improve Description Including pain rating scale, medication(s)/side effects and non-pharmacologic comfort measures Outcome: Progressing   Problem: Health Behavior/Discharge Planning: Goal: Ability to manage health-related needs will improve Outcome: Progressing   

## 2023-01-25 NOTE — Progress Notes (Signed)
Mobility Specialist Progress Note:    01/25/23 0945  Mobility  Activity Ambulated with assistance in hallway  Level of Assistance Minimal assist, patient does 75% or more  Assistive Device Front wheel walker  Distance Ambulated (ft) 20 ft  Range of Motion/Exercises Active;All extremities  Activity Response Tolerated well  Mobility Referral Yes  $Mobility charge 1 Mobility  Mobility Specialist Start Time (ACUTE ONLY) 0945  Mobility Specialist Stop Time (ACUTE ONLY) 1000  Mobility Specialist Time Calculation (min) (ACUTE ONLY) 15 min   Pt received in chair, attempting to stand without assistance. Required MinA to stand and ambulate with RW. Tolerated well, pt started feeling dizzy and became unstable during ambulation. Took a standing rest break, recovered. NT assisted to get pt back to the room, sat pt back in chair. Pt recovered from feeing "woozy", left in chair. All needs met.   Lawerance Bach Mobility Specialist Please contact via Special educational needs teacher or  Rehab office at [phone]

## 2023-01-25 NOTE — Progress Notes (Signed)
Received call from central tele that pt has converted his rhythm to Normal Sinus., rate 100-105 bpm. States pt is still having unifocal PVC's.

## 2023-01-25 NOTE — Progress Notes (Signed)
[facility] Progress Note    Assessment/ Plan:   AKI on CKD3 -B/l Cr around 1.3-1.5. AKI likely secondary to third spacing/decreased EABV. Peak Cr 2.75. Cr stable today 1.9. Remains nonoliguric with good response to diuretics -agree with lasix 80mg  IV x 1 dose today. Redose if needed -Avoid nephrotoxic medications including NSAIDs and iodinated intravenous contrast exposure unless the latter is absolutely indicated.  Preferred narcotic agents for pain control are hydromorphone, fentanyl, and methadone. Morphine should not be used. Avoid Baclofen and avoid oral sodium phosphate and magnesium citrate based laxatives / bowel preps. Continue strict Input and Output monitoring. Will monitor the patient closely with you and intervene or adjust therapy as indicated by changes in clinical status/labs   Hyponatremia -hypervolemic. Na as low as 120 -improving with diuresis. Na up to 128 today. Monitor daily  Hyperkalemia -resolved with kaliuresis. Now K on the lower side, 3.3, watch for now  Acute liver injury -per GI, likely multifactorial: congestive hepatopathy & secondary to metastatic disease. LFTs improving  Anemia -transfuse PRN for hgb <7 -avoid ESAs given metastatic disease  Acute on chronic HFpEF. H/O AICD -c/w IV diuresis, monitor strict I/O & daily weights  PAF -cardiology following. 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 On heparin gtt. Per primary  Discussed with wife and RN at the bedside.   Subjective:   Patient seen and examined bedside. No acute events. As per patient, breathing is better but still swollen. No other complaints. Received lasix 80mg  IV x 1 dose earlier this AM Uop ~5.5L. 900cc uop so far this AM   Objective:   BP 136/77 (BP Location: Left Arm)   Pulse 70   Temp (!) 97.4 F (36.3 C) (Oral)   Resp 18   Ht 5\' 7"  (1.702 m)   Wt  98.6 kg   SpO2 100%   BMI 34.03 kg/m   Intake/Output Summary (Last 24 hours) at 01/25/2023 0835 Last data filed at 01/25/2023 0800 Gross per 24 hour  Intake 1269.03 ml  Output 5200 ml  Net -3930.97 ml   Weight change: -4.533 kg  Physical Exam: Gen: NAD CVS: irreg irreg Resp: CTA b/l, no overt w/r/r/c, normal WOB Abd: soft, ND Ext: 2+ pitting edema b/l Les Neuro: awake, alert  Imaging: No results found.  Labs: BMET Recent Labs  Lab 01/19/23 0720 01/20/23 0729 01/21/23 0457 01/22/23 0501 01/23/23 0407 01/24/23 0338 01/25/23 0416  NA 120* 121* 122* 120* 121* 123* 128*  K 5.3* 4.4 5.4* 5.6* 5.7* 3.9 3.3*  CL 83* 85* 86* 85* 85* 88* 87*  CO2 24 25 24 24 24 24 27   GLUCOSE 85 92 85 91 87 112* 96  BUN 55* 56* 56* 63* 65* 62* 58*  CREATININE 2.70* 2.16* 2.05* 2.10* 2.08* 1.96* 1.94*  CALCIUM 7.8* 7.9* 7.8* 8.1* 8.2* 8.2* 8.4*   CBC Recent Labs  Lab 01/22/23 0501 01/23/23 0407 01/24/23 0338 01/25/23 0416  WBC 10.7* 9.2 7.4 7.0  HGB 7.7* 7.7* 7.9* 8.1*  HCT 24.7* 24.2* 26.1* 26.2*  MCV 95.4 96.0 98.5 98.9  PLT 251 250 236 232    Medications:     bisoprolol  10 mg Oral Daily   feeding supplement  237 mL Oral BID BM   metoprolol tartrate  2.5 mg Intravenous Once   multivitamin with minerals  1 tablet Oral Daily   oxyCODONE  10 mg Oral Q12H   pantoprazole  40 mg Oral BID   polyethylene glycol  17 g Oral BID   potassium chloride  40 mEq Oral BID   traZODone  50 mg Oral QHS      Anthony Sar, MD [facility] 01/25/2023, 8:35 AM

## 2023-01-25 NOTE — Progress Notes (Signed)
Rounding Note    Patient Name: Luis Donovan Date of Encounter: 01/25/2023  [facility] Cardiologist: Lenn Sink  Subjective   No complaints  Inpatient Medications    Scheduled Meds:  bisoprolol  10 mg Oral Daily   feeding supplement  237 mL Oral BID BM   multivitamin with minerals  1 tablet Oral Daily   oxyCODONE  10 mg Oral Q12H   pantoprazole  40 mg Oral BID   polyethylene glycol  17 g Oral BID   potassium chloride  40 mEq Oral BID   traZODone  50 mg Oral QHS   Continuous Infusions:  heparin 2,200 Units/hr (01/24/23 2302)   PRN Meds: acetaminophen **OR** acetaminophen, albuterol, alum & mag hydroxide-simeth, alum & mag hydroxide-simeth, guaiFENesin-dextromethorphan, haloperidol lactate, HYDROmorphone (DILAUDID) injection, levalbuterol, nitroGLYCERIN, oxyCODONE, prochlorperazine   Vital Signs    Vitals:   01/24/23 2121 01/24/23 2354 01/25/23 0442 01/25/23 0500  BP: 113/72 126/70 136/77   Pulse: (!) 46 69 70   Resp: 18 16 18    Temp: 98 F (36.7 C) 97.7 F (36.5 C) (!) 97.4 F (36.3 C)   TempSrc: Oral Oral Oral   SpO2: 92% 98% 100%   Weight:    98.6 kg  Height:        Intake/Output Summary (Last 24 hours) at 01/25/2023 0815 Last data filed at 01/25/2023 0338 Gross per 24 hour  Intake 1269.03 ml  Output 5000 ml  Net -3730.97 ml      01/25/2023    5:00 AM 01/24/2023    5:00 AM 01/23/2023    5:00 AM  Last 3 Weights  Weight (lbs) 217 lb 4.8 oz 227 lb 4.7 oz 237 lb 14 oz  Weight (kg) 98.567 kg 103.1 kg 107.9 kg      Telemetry    SR, PAF - Personally Reviewed  ECG    N/a - Personally Reviewed  Physical Exam   GEN: No acute distress.   Neck: + JVD Cardiac: irreg Respiratory: crackles bilaterally GI: Soft, nontender, non-distended  MS: 1+bilateral LE edema Neuro:  Nonfocal  Psych: Normal affect   Labs    High Sensitivity Troponin:   Recent Labs  Lab 01/20/23 0510 01/20/23 0729 01/21/23 0457  TROPONINIHS 987* 992* 563*      Chemistry Recent Labs  Lab 01/20/23 0510 01/20/23 0729 01/23/23 0407 01/24/23 0338 01/25/23 0416  NA  --    < > 121* 123* 128*  K  --    < > 5.7* 3.9 3.3*  CL  --    < > 85* 88* 87*  CO2  --    < > 24 24 27   GLUCOSE  --    < > 87 112* 96  BUN  --    < > 65* 62* 58*  CREATININE  --    < > 2.08* 1.96* 1.94*  CALCIUM  --    < > 8.2* 8.2* 8.4*  MG 3.6*  --   --   --  3.1*  PROT  --    < > 6.6 6.6 6.6  ALBUMIN  --    < > 3.2* 3.4* 3.5  AST  --    < > 168* 106* 56*  ALT  --    < > 312* 296* 209*  ALKPHOS  --    < > 588* 616* 595*  BILITOT  --    < > 1.4* 1.3* 1.2  GFRNONAA  --    < > 33* 35* 36*  ANIONGAP  --    < > 12 11 14    < > = values in this interval not displayed.    Lipids  Recent Labs  Lab 01/24/23 0338  CHOL 84  TRIG 77  HDL 27*  LDLCALC 42  CHOLHDL 3.1    Hematology Recent Labs  Lab 01/23/23 0407 01/24/23 0338 01/25/23 0416  WBC 9.2 7.4 7.0  RBC 2.52* 2.65* 2.65*  HGB 7.7* 7.9* 8.1*  HCT 24.2* 26.1* 26.2*  MCV 96.0 98.5 98.9  MCH 30.6 29.8 30.6  MCHC 31.8 30.3 30.9  RDW 18.0* 18.3* 19.2*  PLT 250 236 232   Thyroid  Recent Labs  Lab 01/23/23 0407  TSH 3.136    BNP Recent Labs  Lab 01/22/23 0501  BNP 1,290.0*    DDimer No results for input(s): "DDIMER" in the last 168 hours.   Radiology    No results found.  Cardiac Studies     Patient Profile     KEYONTAY WAMBACH is a 77 y.o. 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 He was admitted with exertional SOB, fatigue, and dizziness. Multiple abnormalities seen including severe hyponatremia, AKI on CKD, elevated LFTs, anemia requiring transfusion, possible PE, ongoing malignancy of unclear primary. Cardiology asked to see for afib and elevated troponin.   Assessment & Plan    1.[facility], mention PAF prevoiusly noted by device check. Has been on eliquis at home.  -CHADS2Vasc score is at least 4 (age, HTN, prior CVA).  - continue hep gtt, closer to D/C transition to DOAC, follow H&H given anemia  - rate controlled with bisoprolol 10-mg daily  -can d/c hep gtt, start eliquis. Has been on hep gtt 7 days, will not require the eliquis 10mg  bid dosing for possible PE, resume 5mg  bid.  - recurrent afib with RVR this AM in setting of hypokalemia. Dose IV lopressor 2.5mg  x 1, continue bisoprolol. If persistent with normalization of electrolytes can titrate bisoprolol.     2.Elevated troponin - in setting of afib with RVR, anemia, acute liver inflammation, possible PE - peak trop 992, trending down - suspect most likely demand ischemia from other acute processes, less likely primary cardiac etiology. In general not a cath candidate given malignancy, renal failure, anemia - keep Hgb at 8, manage other acute systemic processses. No plans for ishcemic testing at this time - echo LVEF 50-55%, no WMAs, indet diastolic function, mod LAE, low normal RV function       3.AKI on CKD - per primary team     4. Hyponatremia -from primary team note concern for SIADH, hypovolemia   5. Elevated LFTs - per primary team and GI   6. Anemia - Hgb down to 6.9, transfused 1 unit   7.Malignancy - from heme notes: T angio C/A/P performed for chest/back pain. Scan showed mediastinal adenopathy is noted as well as extensive periaortic and right iliac adenopathy is noted most consistent with metastatic disease or possibly lymphoma. Also noted are diffuse sclerotic densities throughout the visualized skeleton consistent with osseous metastases.Mild right hydroureteronephrosis is noted which appears to be due to occlusion from irregular mass involving the posterior portion of the urinary bladder in the region of the right ureterovesical junction.  - plans for biopsy of bladder mass according  to  notes   8.Possible PE - intermediate VQ scan. LE venous dopplers were negative - avoiding CT with contrast due to renal dysfunction - obtain LE venous US, if DVT present would increase probability of PE. - conitnue heparin gtt - requires anticoag for afib regardless going forward.    9.History of AICD - limited history in [facility], has not seen anyone here since 2008. Reports has been followed by [facility] cardiology in [place] - can see in records he had an MI with VF arrest in 2005 treated with PCI to the LCX, AICD apparently implanted after for secondary prevention - he reports followed by [facility] cardiology who has been monitoring his AICD, will request records   - from [facility] records device placed 2005, replaced 2015.    10. CAD - limited history available in epic - can see he had an MI with VF arrest in 2005 treated with PCI to the LCX, - statin on hold given elevated LFTs   11.Acute on chronic HFpEF - echo LVE 50-55%, no WMAs, indet diastolic function, mod LAE, low normal RV function - increaseing LE edema, reds vest 47% yesterday, BNP up to 1290.Marland Kitchen DIuretics have been held initially due to hyponatremia, patient also has been transfused  -negative 4.3 L yesterday, 2.7 L since admisision - we have deferred diuretic dosing to neprhology in setting of hyponatremia, CKD       For questions or updates, please contact [facility] Please consult [email or web address] for contact info under        Signed, Dina Rich, MD  01/25/2023, 8:15 AM

## 2023-01-25 NOTE — Progress Notes (Signed)
PROGRESS NOTE    Luis Donovan  ION:[id number] DOB: [birth year] DOA: 01/17/2023 PCP: Clinic, Lenn Sink   Brief Narrative:    Luis Donovan is a 77 y.o. male with medical history significant of hypertension, hyperlipidemia, known metastatic disease with unknown primary, GERD, PTSD, OSA, CVA, anemia of chronic disease who presents to the emergency department accompanied by wife due to shortness of breath which has been ongoing for several weeks, but worsened last couple of days.  Exertion and walking aggravates the shortness of breath, he also complained of fatigue and dizziness. Patient was admitted from 8/14 to 8/16 at [facility] long due to acute kidney injury secondary to decreased appetite associated with recent COVID infection.  He was discharged home with home health PT.  Patient states he has been having this shortness of breath since last discharge from the hospital.  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 -Gentle fluid provided without much improvement. -Hemodynamically stable at the moment; will hold fluids and follow electrolytes trend. -Serum osmolality was normal; patient urine osmolality 296. -Continue supportive care. -Continue holding on any further fluid resuscitation; patient receiving heparin drip we will add fluid and is status post 1 unit PRBC transfusion; patient will be initiated on Lasix. -So far sodium level has remained stable. -Continue to follow electrolytes trend. -Continue to be judicious about fluid intake and  pursuit treatment with diuresis.  Appreciate ongoing nephrology recommendations with plan to administer 1 dose of IV Lasix today and continue close monitoring   2-symptomatic anemia-stable -Hemoglobin 6.9 patient reporting feeling weak and tired -Stable vital signs. -Status post 1 unit PRBC transfusion and current hemoglobin level 7.7 -Continue to follow trend and further transfuse as needed. -No overt bleeding appreciated.   3-elevated D-dimer -With positive intermediate probability V/Q scan -Continue treatment with heparin drip. -Due to renal failure unable to assess CT angiogram of the chest. -Ultrasound negative for DVT    4-acute on chronic renal failure; with hyponatremia and fluid overload -Patient with a stage IIIb at baseline -Presented with worsening creatinine and a GFR down to 24 -Continue minimizing nephrotoxic agents, the use of contrast and avoiding hypotension. -Creatinine currently 2.08 -Patient advised to maintain adequate oral hydration -Nephrology service has been consulted and will follow recommendations. -Renal ultrasound has been ordered; continue adjusted dosage of IV diuresis, albumin and acetazolamide; metolazone x 1 dose provided on 01/23/2023.   5-mild hypokalemia -Replete and reevaluate in a.m.   6-transaminitis -Will follow GI service recommendation -Slightly improved this morning; continue to follow trend. 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   7-GERD -Continue PPI. -Per GI once further medically stable plan is to proceed with EGD and colonoscopy; which can happen as an outpatient.   8-malignancy -continue outpatient follow up with oncology and urology -per family has been approved by [facility] for PET Scan   9-generalized chronic pain -continue home analgesic regimen and  further adjust  as needed. -Use saline worsening kidney disease   10-chest discomfort overnight/elevated troponin -Most likely demand ischemia; not a candidate for cardiac cath -Continue to follow troponin -2D echo has demonstrated preserved ejection fraction, no regional wall motion abnormality; mild left ventricular hypertrophy and low right ventricular systolic function.  Patient with moderate elevated pulmonary artery systolic pressure. -Continue heparin drip and the use of metoprolol as dictated by cardiology service.. -Continue IV diuresis as mentioned above -Denies chest pain.   11-acute diastolic heart failure -Most likely trigger by arrhythmia during transient episode of paroxysmal atrial fibrillation with RVR. -Continue IV diuresis -Given electrolytes abnormalities and worsening renal function nephrology service has been consulted -Continue daily weights, strict I's and O's and fluid restrictions.   12-intermittent confusion -ABG demonstrating stable CO2 levels. -Continue CPAP nightly -Continue constant reorientation and supportive care.   -As needed low-dose Haldol will be available.   13-paroxysmal atrial fibrillation -Continue the use of bisoprolol -Currently has converted to sinus rhythm and his rate is controlled. -Eliquis at time of discharge for secondary prevention.  14-Obesity -BMI 35.6    DVT prophylaxis: Heparin drip to Eliquis Code Status: Full Family Communication: None at bedside Disposition Plan:  Status is: Inpatient Remains inpatient appropriate because: Requires ongoing IV medications.  Consultants:  Nephrology GI Cardiology  Procedures:  None  Antimicrobials:  Anti-infectives (From admission, onward)    None       Subjective: Patient seen and evaluated today with no new acute complaints or concerns. No acute concerns or events noted overnight.  He continues to have aggressive diuresis.  His shortness of breath is improving, but he  still has significant lower extremity edema.  Objective: Vitals:   01/24/23 2121 01/24/23 2354 01/25/23 0442 01/25/23 0500  BP: 113/72 126/70 136/77   Pulse: (!) 46 69 70   Resp: 18 16 18    Temp: 98 F (36.7 C) 97.7 F (36.5 C) (!) 97.4 F (36.3 C)   TempSrc: Oral Oral Oral   SpO2: 92% 98% 100%   Weight:    98.6 kg  Height:        Intake/Output Summary (Last 24 hours) at 01/25/2023 0852 Last data filed at 01/25/2023 0800 Gross per 24 hour  Intake 1269.03 ml  Output 5200 ml  Net -3930.97 ml   Filed Weights   01/23/23 0500 01/24/23 0500 01/25/23 0500  Weight: 107.9 kg 103.1 kg 98.6 kg    Examination:  General exam: Appears calm and comfortable  Respiratory system: Clear to auscultation. Respiratory effort normal.  Room air. Cardiovascular system: S1 & S2 heard, irregular rate and rhythm Gastrointestinal system: Abdomen is soft Central nervous system: Alert and awake Extremities: Pitting edema bilaterally Skin: No significant lesions noted Psychiatry: Flat affect.    Data Reviewed: I have personally reviewed following labs and imaging studies  CBC: Recent Labs  Lab 01/21/23 0457 01/22/23 0501 01/23/23 0407 01/24/23 0338 01/25/23 0416  WBC 9.8 10.7* 9.2 7.4 7.0  HGB 8.0* 7.7* 7.7* 7.9* 8.1*  HCT 25.9* 24.7* 24.2* 26.1* 26.2*  MCV 97.4 95.4 96.0 98.5 98.9  PLT 241 251 250 236 232   Basic Metabolic Panel: Recent Labs  Lab 01/20/23 0510 01/20/23 0729 01/21/23 0457 01/22/23 0501 01/23/23 0407 01/24/23 0338 01/25/23 0416  NA  --    < > 122* 120* 121* 123* 128*  K  --    < > 5.4* 5.6* 5.7* 3.9 3.3*  CL  --    < > 86* 85* 85* 88* 87*  CO2  --    < >  24 24 24 24 27   GLUCOSE  --    < > 85 91 87 112* 96  BUN  --    < > 56* 63* 65* 62* 58*  CREATININE  --    < > 2.05* 2.10* 2.08* 1.96* 1.94*  CALCIUM  --    < > 7.8* 8.1* 8.2* 8.2* 8.4*  MG 3.6*  --   --   --   --   --  3.1*   < > = values in this interval not displayed.   GFR: Estimated Creatinine  Clearance: 37.4 mL/min (A) (by C-G formula based on SCr of 1.94 mg/dL (H)). Liver Function Tests: Recent Labs  Lab 01/20/23 0729 01/21/23 0457 01/23/23 0407 01/24/23 0338 01/25/23 0416  AST 331* 282* 168* 106* 56*  ALT 552* 528* 312* 296* 209*  ALKPHOS 671* 630* 588* 616* 595*  BILITOT 1.4* 1.3* 1.4* 1.3* 1.2  PROT 6.5 6.6 6.6 6.6 6.6  ALBUMIN 3.2* 3.0* 3.2* 3.4* 3.5   No results for input(s): "LIPASE", "AMYLASE" in the last 168 hours.  Recent Labs  Lab 01/19/23 1412  AMMONIA 20   Coagulation Profile: Recent Labs  Lab 01/19/23 1412 01/22/23 1048 01/23/23 0407 01/24/23 0338  INR 1.4* 1.3* 1.2 1.4*   Cardiac Enzymes: Recent Labs  Lab 01/20/23 0510  CKTOTAL 296   BNP (last 3 results) No results for input(s): "PROBNP" in the last 8760 hours. HbA1C: No results for input(s): "HGBA1C" in the last 72 hours. CBG: Recent Labs  Lab 01/21/23 1242  GLUCAP 88   Lipid Profile: Recent Labs    01/24/23 0338  CHOL 84  HDL 27*  LDLCALC 42  TRIG 77  CHOLHDL 3.1   Thyroid Function Tests: Recent Labs    01/23/23 0407  TSH 3.136   Anemia Panel: No results for input(s): "VITAMINB12", "FOLATE", "FERRITIN", "TIBC", "IRON", "RETICCTPCT" in the last 72 hours. Sepsis Labs: No results for input(s): "PROCALCITON", "LATICACIDVEN" in the last 168 hours.  Recent Results (from the past 240 hour(s))  MRSA Next Gen by PCR, Nasal     Status: None   Collection Time: 01/18/23  4:22 AM   Specimen: Nasal Mucosa; Nasal Swab  Result Value Ref Range Status   MRSA by PCR Next Gen NOT DETECTED NOT DETECTED Final    Comment: (NOTE) The GeneXpert MRSA Assay (FDA approved for NASAL specimens only), is one component of a comprehensive MRSA colonization surveillance program. It is not intended to diagnose MRSA infection nor to guide or monitor treatment for MRSA infections. Test performance is not FDA approved in patients less than 74 years old. Performed at [facility], [address], [place], [place] [address]          Radiology Studies: No results found.      Scheduled Meds:  apixaban  5 mg Oral BID   bisoprolol  10 mg Oral Daily   feeding supplement  237 mL Oral BID BM   metoprolol tartrate  2.5 mg Intravenous Once   multivitamin with minerals  1 tablet Oral Daily   oxyCODONE  10 mg Oral Q12H   pantoprazole  40 mg Oral BID   polyethylene glycol  17 g Oral BID   potassium chloride  40 mEq Oral BID   traZODone  50 mg Oral QHS     LOS: 7 days    Time spent: 35 minutes    Ladawn Boullion Hoover Brunette, DO [facility]  If 7PM-7AM, please contact night-coverage [email or web address] 01/25/2023, 8:52 AM

## 2023-01-25 NOTE — Progress Notes (Addendum)
Pt has remained up in recliner most of the day. Ambulated to BR for shower with assist x1 as pt has unsteady gait. Pt is oriented to person consistently, but disoriented to place/time and situation. Unable to reorient pt despite multiple attempts. This is not pt's baseline cognition per wife. Pt has voided total of 1 liter of urine this shift, but none since before lunchtime. Pt with no complaint of pain this shift. Pt remains in NSR with PVC's per central tele.

## 2023-01-26 ENCOUNTER — Telehealth: Payer: Self-pay | Admitting: Gastroenterology

## 2023-01-26 DIAGNOSIS — I480 Paroxysmal atrial fibrillation: Secondary | ICD-10-CM | POA: Diagnosis not present

## 2023-01-26 DIAGNOSIS — I2489 Other forms of acute ischemic heart disease: Secondary | ICD-10-CM

## 2023-01-26 DIAGNOSIS — R7401 Elevation of levels of liver transaminase levels: Secondary | ICD-10-CM | POA: Diagnosis not present

## 2023-01-26 DIAGNOSIS — E871 Hypo-osmolality and hyponatremia: Secondary | ICD-10-CM | POA: Diagnosis not present

## 2023-01-26 DIAGNOSIS — D649 Anemia, unspecified: Secondary | ICD-10-CM | POA: Diagnosis not present

## 2023-01-26 LAB — COMPREHENSIVE METABOLIC PANEL
ALT: 162 U/L — ABNORMAL HIGH (ref 0–44)
AST: 46 U/L — ABNORMAL HIGH (ref 15–41)
Albumin: 3.6 g/dL (ref 3.5–5.0)
Alkaline Phosphatase: 584 U/L — ABNORMAL HIGH (ref 38–126)
Anion gap: 11 (ref 5–15)
BUN: 58 mg/dL — ABNORMAL HIGH (ref 8–23)
CO2: 25 mmol/L (ref 22–32)
Calcium: 8.2 mg/dL — ABNORMAL LOW (ref 8.9–10.3)
Chloride: 92 mmol/L — ABNORMAL LOW (ref 98–111)
Creatinine, Ser: 1.92 mg/dL — ABNORMAL HIGH (ref 0.61–1.24)
GFR, Estimated: 36 mL/min — ABNORMAL LOW (ref >60)
Glucose, Bld: 106 mg/dL — ABNORMAL HIGH (ref 70–99)
Potassium: 4.1 mmol/L (ref 3.5–5.1)
Sodium: 128 mmol/L — ABNORMAL LOW (ref 135–145)
Total Bilirubin: 0.9 mg/dL (ref 0.3–1.2)
Total Protein: 6.7 g/dL (ref 6.5–8.1)

## 2023-01-26 LAB — CBC
HCT: 26.5 % — ABNORMAL LOW (ref 39.0–52.0)
Hemoglobin: 8 g/dL — ABNORMAL LOW (ref 13.0–17.0)
MCH: 30.3 pg (ref 26.0–34.0)
MCHC: 30.2 g/dL (ref 30.0–36.0)
MCV: 100.4 fL — ABNORMAL HIGH (ref 80.0–100.0)
Platelets: 179 10*3/uL (ref 150–400)
RBC: 2.64 MIL/uL — ABNORMAL LOW (ref 4.22–5.81)
RDW: 19.5 % — ABNORMAL HIGH (ref 11.5–15.5)
WBC: 6.2 10*3/uL (ref 4.0–10.5)
nRBC: 1.4 % — ABNORMAL HIGH (ref 0.0–0.2)

## 2023-01-26 LAB — MAGNESIUM: Magnesium: 3 mg/dL — ABNORMAL HIGH (ref 1.7–2.4)

## 2023-01-26 MED ORDER — TIMOLOL MALEATE 0.5 % OP SOLN
1.0000 [drp] | Freq: Every day | OPHTHALMIC | Status: DC
  Administered 2023-01-26 – 2023-01-27 (×2): 1 [drp] via OPHTHALMIC
  Filled 2023-01-26: qty 5

## 2023-01-26 MED ORDER — LATANOPROST 0.005 % OP SOLN
1.0000 [drp] | Freq: Every day | OPHTHALMIC | Status: DC
  Administered 2023-01-26: 1 [drp] via OPHTHALMIC
  Filled 2023-01-26: qty 2.5

## 2023-01-26 MED ORDER — FUROSEMIDE 10 MG/ML IJ SOLN
60.0000 mg | Freq: Two times a day (BID) | INTRAMUSCULAR | Status: AC
  Administered 2023-01-26 (×2): 60 mg via INTRAVENOUS
  Filled 2023-01-26 (×2): qty 6

## 2023-01-26 MED ORDER — CAMPHOR-MENTHOL 0.5-0.5 % EX LOTN
TOPICAL_LOTION | CUTANEOUS | Status: DC | PRN
  Filled 2023-01-26 (×7): qty 222

## 2023-01-26 MED ORDER — POLYVINYL ALCOHOL 1.4 % OP SOLN
1.0000 [drp] | Freq: Two times a day (BID) | OPHTHALMIC | Status: DC
  Administered 2023-01-27: 1 [drp] via OPHTHALMIC
  Filled 2023-01-26: qty 15

## 2023-01-26 NOTE — Progress Notes (Signed)
   01/26/23 0800  ReDS Vest / Clip  Station Marker D  Ruler Value 42  ReDS Value Range 36 - 40  ReDS Actual Value 36

## 2023-01-26 NOTE — Progress Notes (Signed)
Central telemetry called and stated that patient had converted back to a.fib w/ RVR and that one he first converted his HR was 160 not sustained and that he was 120 at that time. Assessed patient he denies chest pain or shortness of breath. He stated that he is a little dizzy. Notified Dr. Sherryll Burger and Dr. Diona Browner. No new orders at this time.   Central telemetry called again around 1141 and stated that patient had converted back to NSR w/ PAC. Notified Dr. Diona Browner and Dr. Sherryll Burger. No new orders.     01/26/23 1139  Vitals  Temp 98.1 F (36.7 C)  Temp Source Oral  BP 135/69  MAP (mmHg) 90  BP Location Left Arm  BP Method Automatic  Patient Position (if appropriate) Lying  Pulse Rate 88  Resp 20  MEWS COLOR  MEWS Score Color Green  Oxygen Therapy  SpO2 99 %  O2 Device Room Air  MEWS Score  MEWS Temp 0  MEWS Systolic 0  MEWS Pulse 0  MEWS RR 0  MEWS LOC 0  MEWS Score 0

## 2023-01-26 NOTE — Progress Notes (Signed)
[facility] Progress Note    Assessment/ Plan:   AKI on CKD3 -B/l Cr around 1.3-1.5. AKI likely secondary to third spacing/decreased EABV. Peak Cr 2.75. Cr stable today 1.9. Remains nonoliguric with good response to diuretics -lasix 60mg  IV BID for today, reassess tomorrow -Avoid nephrotoxic medications including NSAIDs and iodinated intravenous contrast exposure unless the latter is absolutely indicated.  Preferred narcotic agents for pain control are hydromorphone, fentanyl, and methadone. Morphine should not be used. Avoid Baclofen and avoid oral sodium phosphate and magnesium citrate based laxatives / bowel preps. Continue strict Input and Output monitoring. Will monitor the patient closely with you and intervene or adjust therapy as indicated by changes in clinical status/labs   Hyponatremia -hypervolemic. Na as low as 120 -improved with diuresis. Na stable 128 today. Monitor daily  Hyperkalemia -resolved with kaliuresis. K 4.1 today  Acute liver injury -per GI, likely multifactorial: congestive hepatopathy & secondary to metastatic disease. LFTs continue to improve  Anemia -transfuse PRN for hgb <7 -avoid ESAs given metastatic disease  Acute on chronic HFpEF. H/O AICD -vol status seems to be improving -c/w IV diuresis for today-hopefully can transition back to oral diuretics in the day or two, monitor strict I/O & daily weights  PAF -cardiology following. On eliquis now  Malignancy -with mets -unknown primary, does have bladder mass, pending outpatient biopsy with urology  Elevated d-dimer -positive intermediate probability V/Q scan. S/p heparin gtt, now on eliquis. Per primary  Discussed with wife and primary at the bedside.  Anthony Sar, MD [facility]  Subjective:   Patient seen and examined bedside. Intermittently confused per chart Wife at bedside today. Patient reports that he is feeling much better. Swelling is down some, still  having some DOE. Not happy about the oatmeal. No other complaints. UOP 1.6L + 4 unmeasured voids Weight down to 94.1 from 98.6kg.   Objective:   BP 134/61 (BP Location: Left Arm)   Pulse 75   Temp 98.3 F (36.8 C)   Resp 16   Ht 5\' 7"  (1.702 m)   Wt 94.1 kg   SpO2 97%   BMI 32.49 kg/m   Intake/Output Summary (Last 24 hours) at 01/26/2023 0913 Last data filed at 01/26/2023 0900 Gross per 24 hour  Intake 960 ml  Output 800 ml  Net 160 ml   Weight change: -4.467 kg  Physical Exam: Gen: NAD CVS: irreg irreg Resp: CTA b/l, no overt w/r/r/c, normal WOB Abd: soft, ND Ext: 1+ pitting edema b/l Les Neuro: awake, alert  Imaging: No results found.  Labs: BMET Recent Labs  Lab 01/20/23 0729 01/21/23 0457 01/22/23 0501 01/23/23 0407 01/24/23 0338 01/25/23 0416 01/26/23 0359  NA 121* 122* 120* 121* 123* 128* 128*  K 4.4 5.4* 5.6* 5.7* 3.9 3.3* 4.1  CL 85* 86* 85* 85* 88* 87* 92*  CO2 25 24 24 24 24 27 25   GLUCOSE 92 85 91 87 112* 96 106*  BUN 56* 56* 63* 65* 62* 58* 58*  CREATININE 2.16* 2.05* 2.10* 2.08* 1.96* 1.94* 1.92*  CALCIUM 7.9* 7.8* 8.1* 8.2* 8.2* 8.4* 8.2*   CBC Recent Labs  Lab 01/23/23 0407 01/24/23 0338 01/25/23 0416 01/26/23 0359  WBC 9.2 7.4 7.0 6.2  HGB 7.7* 7.9* 8.1* 8.0*  HCT 24.2* 26.1* 26.2* 26.5*  MCV 96.0 98.5 98.9 100.4*  PLT 250 236 232 179    Medications:     apixaban  5 mg Oral BID   bisoprolol  10 mg Oral Daily  feeding supplement  237 mL Oral BID BM   furosemide  60 mg Intravenous BID   multivitamin with minerals  1 tablet Oral Daily   oxyCODONE  10 mg Oral Q12H   pantoprazole  40 mg Oral BID   polyethylene glycol  17 g Oral BID   traZODone  50 mg Oral QHS      Anthony Sar, MD [facility] 01/26/2023, 9:13 AM

## 2023-01-26 NOTE — Progress Notes (Addendum)
Patient Name: Luis Donovan Date of Encounter: 01/26/2023 [facility] Cardiologist: [facility] cardiology  Interval Summary  .    No cardiac complaints. Lying flat comfortably.  Vital Signs .    Vitals:   01/25/23 1450 01/25/23 2127 01/26/23 0553 01/26/23 1018  BP: 129/77 110/70 134/61 130/64  Pulse: 75 62 75 67  Resp:  20 16   Temp: 97.7 F (36.5 C) 98.6 F (37 C) 98.3 F (36.8 C)   TempSrc: Oral Oral    SpO2: 96% 100% 97% 99%  Weight:   94.1 kg   Height:        Intake/Output Summary (Last 24 hours) at 01/26/2023 1042 Last data filed at 01/26/2023 0900 Gross per 24 hour  Intake 960 ml  Output 800 ml  Net 160 ml      01/26/2023    5:53 AM 01/25/2023    5:00 AM 01/24/2023    5:00 AM  Last 3 Weights  Weight (lbs) 207 lb 7.3 oz 217 lb 4.8 oz 227 lb 4.7 oz  Weight (kg) 94.1 kg 98.567 kg 103.1 kg      Telemetry/ECG    NSR - Personally Reviewed  Physical Exam .   GEN: No acute distress.   Neck: No JVD Cardiac:  RRR, no murmurs, rubs, or gallops.  Respiratory: Clear to auscultation bilaterally. GI: Soft, nontender, non-distended  MS: No edema  Assessment & Plan .     [facility], mention [facility]F prevoiusly noted by device check. Has been on eliquis at home.  - CHADS2Vasc score is at least 4 (age, HTN, prior CVA).  - rate controlled with bisoprolol 10-mg daily - maintaining NSR, continue bisoprolol.   Elevated troponin - in setting of afib with RVR, anemia, acute liver inflammation, possible PE - peak trop 992, trending down - suspect most likely demand ischemia from other acute processes, less likely primary cardiac etiology. In general not a cath candidate given malignancy, renal failure, anemia - No plans for ishcemic testing at this time - echo LVEF 50-55%, no WMAs, indet diastolic function, mod LAE, low normal RV function   AKI on CKD - per primary team and nephrology   Malignancy - from heme notes: T angio C/A/P performed for chest/back  pain. Scan showed mediastinal adenopathy is noted as well as extensive periaortic and right iliac adenopathy is noted most consistent with metastatic disease or possibly lymphoma. Also noted are diffuse sclerotic densities throughout the visualized skeleton consistent with osseous metastases.Mild right hydroureteronephrosis is noted which appears to be due to occlusion from irregular mass involving the posterior portion of the urinary bladder in the region of the right ureterovesical junction.  - plans for biopsy of bladder mass according to notes   Possible PE - intermediate VQ scan. LE venous dopplers were negative - avoiding CT with contrast due to renal dysfunction - LE venous US,no DVT  - transitioned from heparin gtt to Eliquis - requires anticoag for afib regardless going forward.     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  Has diuresed reasonably well.   For questions or updates, please contact [facility] Please consult [email or web address] for contact info under      Signed, Jacolyn Reedy, PA-C    Attending note:  Patient seen and examined.  Chart reviewed and case discussed with Ms. Lilla Shook, agree with her above findings.  Patient is in sinus rhythm by telemetry, tolerating Eliquis and bisoprolol.  He is afebrile, heart rate in the 60s in sinus rhythm  currently by telemetry, blood pressure 130/64.  Lungs exhibit crackles at the bases, cardiac exam with RRR.  Mild peripheral edema noted.  ReDS clip measurement 36.  Pertinent lab work includes sodium 128, potassium 4.1, creatinine stable at 1.92, slowly improving LFTs, hemoglobin 8, platelets 179.  Currently in sinus rhythm and on Eliquis along with bisoprolol 10 mg daily.  Lasix being adjusted by nephrology.  Continue to hold off Lipitor until LFTs normalize.  Outpatient ARB and hydralazine also held - blood pressure has been stable.  No further ischemic testing is planned, high-sensitivity troponin I levels are most consistent with demand ischemia in the setting of other acute illnesses.  We will plan to sign off, he will need to [facility] as [facility].  Jonelle Sidle, M.D., F.A.C.C.

## 2023-01-26 NOTE — Progress Notes (Signed)
PROGRESS NOTE    RODRICUS DEDRICK  ZOX:[id number] DOB: [birth year] DOA: 01/17/2023 PCP: [facility]   Brief Narrative:    Luis Donovan is a 77 y.o. male with medical history significant of hypertension, hyperlipidemia, known metastatic disease with unknown primary, GERD, PTSD, OSA, CVA, anemia of chronic disease who presents to the emergency department accompanied by wife due to shortness of breath which has been ongoing for several weeks, but worsened last couple of days.  Exertion and walking aggravates the shortness of breath, he also complained of fatigue and dizziness. Patient was admitted from 8/14 to 8/16 at [facility] due to acute kidney injury secondary to decreased appetite associated with recent COVID infection.  He was discharged home with home health PT.  Patient states he has been having this shortness of breath since last discharge from the hospital.  He continues to get some ongoing diuresis per nephrology and should be able to discharge by 9/11 if labs remain stable.  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 -Gentle fluid provided without much improvement. -Hemodynamically stable at the moment; will hold fluids and follow electrolytes trend. -Serum osmolality was normal; patient urine osmolality 296. -Continue supportive care. -Continue holding on any further fluid resuscitation; patient receiving heparin drip we will add fluid and is status post 1 unit PRBC transfusion; patient will be initiated on Lasix. -So far  sodium level has remained stable. -Continue to follow electrolytes trend. -Continue to be judicious about fluid intake and pursuit treatment with diuresis.  Appreciate ongoing nephrology recommendations with plan to administer another 2 doses of Lasix today with anticipated discharge in a.m.   2-symptomatic anemia-stable -Hemoglobin 6.9 patient reporting feeling weak and tired -Stable vital signs. -Status post 1 unit PRBC transfusion and current hemoglobin level 7.7 -Continue to follow trend and further transfuse as needed. -No overt bleeding appreciated.   3-elevated D-dimer -With positive intermediate probability V/Q scan -Patient now transitioned to Eliquis -Due to renal failure unable to assess CT angiogram of the chest. -Ultrasound negative for DVT    4-acute on chronic renal failure; with hyponatremia and fluid overload -Patient with a stage IIIb at baseline -Presented with worsening creatinine and a GFR down to 24 -Continue minimizing nephrotoxic agents, the use of contrast and avoiding hypotension. -Creatinine currently 2.08 -Patient advised to maintain adequate oral hydration -Nephrology service has been consulted and will follow recommendations. -Renal ultrasound has been ordered; continue adjusted dosage of IV diuresis, albumin and acetazolamide; metolazone x 1 dose provided on 01/23/2023.   5-mild hypokalemia -Replete and reevaluate in a.m.   6-transaminitis -Will follow GI service recommendation -Slightly improved this morning; continue to follow trend. 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   7-GERD -Continue PPI. -Per GI once further medically stable plan is to proceed with EGD and colonoscopy; which can happen as an outpatient.   8-malignancy -continue outpatient follow up with oncology  and  urology -per family has been approved by [facility] for PET Scan   9-generalized chronic pain -continue home analgesic regimen and further adjust as needed. -Use saline worsening kidney disease   10-chest discomfort overnight/elevated troponin -Most likely demand ischemia; not a candidate for cardiac cath -Continue to follow troponin -2D echo has demonstrated preserved ejection fraction, no regional wall motion abnormality; mild left ventricular hypertrophy and low right ventricular systolic function.  Patient with moderate elevated pulmonary artery systolic pressure. -Continue IV diuresis as mentioned above -Denies chest pain.   11-acute diastolic heart failure -Most likely trigger by arrhythmia during transient episode of paroxysmal atrial fibrillation with RVR. -Continue IV diuresis -Given electrolytes abnormalities and worsening renal function nephrology service has been consulted -Continue daily weights, strict I's and O's and fluid restrictions.   12-intermittent confusion -ABG demonstrating stable CO2 levels. -Continue CPAP nightly -Continue constant reorientation and supportive care.   -As needed low-dose Haldol will be available.   13-paroxysmal atrial fibrillation -Continue the use of bisoprolol -Currently has converted to sinus rhythm and his rate is controlled. -Eliquis at time of discharge for secondary prevention.  14-Obesity -BMI 35.6    DVT prophylaxis: Eliquis Code Status: Full Family Communication: Wife at bedside 9/10 Disposition Plan:  Status is: Inpatient Remains inpatient appropriate because: Requires ongoing IV medications.  Consultants:  Nephrology GI Cardiology  Procedures:  None  Antimicrobials:  Anti-infectives (From admission, onward)    None       Subjective: Patient seen and evaluated today with no new acute complaints or concerns. No acute concerns or events noted overnight.  He continues to have lower extremity edema and denies  any further shortness of breath.  He is eager for discharge home, but nephrology still recommending ongoing IV diuresis.  Objective: Vitals:   01/25/23 1450 01/25/23 2127 01/26/23 0553 01/26/23 1018  BP: 129/77 110/70 134/61 130/64  Pulse: 75 62 75 67  Resp:  20 16   Temp: 97.7 F (36.5 C) 98.6 F (37 C) 98.3 F (36.8 C)   TempSrc: Oral Oral    SpO2: 96% 100% 97% 99%  Weight:   94.1 kg   Height:        Intake/Output Summary (Last 24 hours) at 01/26/2023 1044 Last data filed at 01/26/2023 0900 Gross per 24 hour  Intake 960 ml  Output 800 ml  Net 160 ml   Filed Weights   01/24/23 0500 01/25/23 0500 01/26/23 0553  Weight: 103.1 kg 98.6 kg 94.1 kg    Examination:  General exam: Appears calm and comfortable  Respiratory system: Clear to auscultation. Respiratory effort normal.  Room air. Cardiovascular system: S1 & S2 heard, irregular rate and rhythm Gastrointestinal system: Abdomen is soft Central nervous system: Alert and awake Extremities: Pitting edema bilaterally Skin: No significant lesions noted Psychiatry: Flat affect.    Data Reviewed: I have personally reviewed following labs and imaging studies  CBC: Recent Labs  Lab 01/22/23 0501 01/23/23 0407 01/24/23 0338 01/25/23 0416 01/26/23 0359  WBC 10.7* 9.2 7.4 7.0 6.2  HGB 7.7* 7.7* 7.9* 8.1* 8.0*  HCT 24.7* 24.2* 26.1* 26.2* 26.5*  MCV 95.4 96.0 98.5 98.9 100.4*  PLT 251 250 236 232 179   Basic Metabolic Panel: Recent Labs  Lab 01/20/23 0510 01/20/23 0729 01/22/23 0501 01/23/23 0407 01/24/23 0338 01/25/23 0416 01/26/23 0359  NA  --    < > 120* 121* 123* 128* 128*  K  --    < > 5.6* 5.7* 3.9 3.3* 4.1  CL  --    < >  85* 85* 88* 87* 92*  CO2  --    < > 24 24 24 27 25   GLUCOSE  --    < > 91 87 112* 96 106*  BUN  --    < > 63* 65* 62* 58* 58*  CREATININE  --    < > 2.10* 2.08* 1.96* 1.94* 1.92*  CALCIUM  --    < > 8.1* 8.2* 8.2* 8.4* 8.2*  MG 3.6*  --   --   --   --  3.1* 3.0*   < > = values in  this interval not displayed.   GFR: Estimated Creatinine Clearance: 36.9 mL/min (A) (by C-G formula based on SCr of 1.92 mg/dL (H)). Liver Function Tests: Recent Labs  Lab 01/21/23 0457 01/23/23 0407 01/24/23 0338 01/25/23 0416 01/26/23 0359  AST 282* 168* 106* 56* 46*  ALT 528* 312* 296* 209* 162*  ALKPHOS 630* 588* 616* 595* 584*  BILITOT 1.3* 1.4* 1.3* 1.2 0.9  PROT 6.6 6.6 6.6 6.6 6.7  ALBUMIN 3.0* 3.2* 3.4* 3.5 3.6   No results for input(s): "LIPASE", "AMYLASE" in the last 168 hours.  Recent Labs  Lab 01/19/23 1412  AMMONIA 20   Coagulation Profile: Recent Labs  Lab 01/19/23 1412 01/22/23 1048 01/23/23 0407 01/24/23 0338  INR 1.4* 1.3* 1.2 1.4*   Cardiac Enzymes: Recent Labs  Lab 01/20/23 0510  CKTOTAL 296   BNP (last 3 results) No results for input(s): "PROBNP" in the last 8760 hours. HbA1C: No results for input(s): "HGBA1C" in the last 72 hours. CBG: Recent Labs  Lab 01/21/23 1242  GLUCAP 88   Lipid Profile: Recent Labs    01/24/23 0338  CHOL 84  HDL 27*  LDLCALC 42  TRIG 77  CHOLHDL 3.1   Thyroid Function Tests: No results for input(s): "TSH", "T4TOTAL", "FREET4", "T3FREE", "THYROIDAB" in the last 72 hours.  Anemia Panel: No results for input(s): "VITAMINB12", "FOLATE", "FERRITIN", "TIBC", "IRON", "RETICCTPCT" in the last 72 hours. Sepsis Labs: No results for input(s): "PROCALCITON", "LATICACIDVEN" in the last 168 hours.  Recent Results (from the past 240 hour(s))  MRSA Next Gen by PCR, Nasal     Status: None   Collection Time: 01/18/23  4:22 AM   Specimen: Nasal Mucosa; Nasal Swab  Result Value Ref Range Status   MRSA by PCR Next Gen NOT DETECTED NOT DETECTED Final    Comment: (NOTE) The GeneXpert MRSA Assay (FDA approved for NASAL specimens only), is one component of a comprehensive MRSA colonization surveillance program. It is not intended to diagnose MRSA infection nor to guide or monitor treatment for MRSA infections. Test  performance is not FDA approved in patients less than 70 years old. Performed at [facility], [address] [place], [place] [address]          Radiology Studies: No results found.      Scheduled Meds:  apixaban  5 mg Oral BID   bisoprolol  10 mg Oral Daily   feeding supplement  237 mL Oral BID BM   furosemide  60 mg Intravenous BID   multivitamin with minerals  1 tablet Oral Daily   oxyCODONE  10 mg Oral Q12H   pantoprazole  40 mg Oral BID   polyethylene glycol  17 g Oral BID   traZODone  50 mg Oral QHS     LOS: 8 days    Time spent: 35 minutes    Miquan Tandon Hoover Brunette, DO [facility]  If 7PM-7AM, please contact night-coverage  [email or web address] 01/26/2023, 10:44 AM

## 2023-01-26 NOTE — Progress Notes (Signed)
Subjective: Reports he is feeling ok. Denies any recent abdominal pain, nausea, or vomiting. Tolerating his diet well when he gets something he likes. No brbpr or melena. Bowels are moving fairly well. No mental status changes. Can be forgetful.   Objective: Vital signs in last 24 hours: Temp:  [97.7 F (36.5 C)-98.6 F (37 C)] 98.3 F (36.8 C) (09/10 0553) Pulse Rate:  [62-75] 67 (09/10 1018) Resp:  [16-20] 16 (09/10 0553) BP: (110-134)/(61-77) 130/64 (09/10 1018) SpO2:  [96 %-100 %] 99 % (09/10 1018) Weight:  [94.1 kg] 94.1 kg (09/10 0553) Last BM Date : 01/24/23 General:   Alert and oriented, pleasant, NAD.  Head:  Normocephalic and atraumatic. Eyes:  No icterus, sclera clear. Conjuctiva pink.  Abdomen:  Bowel sounds present, soft, non-distended. Mild tenderness in suprapubic area and epigastric area. No HSM or hernias noted. No rebound or guarding.  Msk:  Symmetrical without gross deformities. Normal posture. Pulses:  Normal pulses noted. Extremities:  With edema below the knees. Neurologic:  Alert and  oriented x4;  grossly normal neurologically. Skin:  Warm and dry, intact without significant lesions.  Psych: Normal mood and affect.  Intake/Output from previous day: 09/09 0701 - 09/10 0700 In: 960 [P.O.:960] Out: 1600 [Urine:1600] Intake/Output this shift: Total I/O In: 480 [P.O.:480] Out: 400 [Urine:400]  Lab Results: Recent Labs    01/24/23 0338 01/25/23 0416 01/26/23 0359  WBC 7.4 7.0 6.2  HGB 7.9* 8.1* 8.0*  HCT 26.1* 26.2* 26.5*  PLT 236 232 179   BMET Recent Labs    01/24/23 0338 01/25/23 0416 01/26/23 0359  NA 123* 128* 128*  K 3.9 3.3* 4.1  CL 88* 87* 92*  CO2 24 27 25   GLUCOSE 112* 96 106*  BUN 62* 58* 58*  CREATININE 1.96* 1.94* 1.92*  CALCIUM 8.2* 8.4* 8.2*   LFT Recent Labs    01/24/23 0338 01/25/23 0416 01/26/23 0359  PROT 6.6 6.6 6.7  ALBUMIN 3.4* 3.5 3.6  AST 106* 56* 46*  ALT 296* 209* 162*  ALKPHOS 616* 595* 584*   BILITOT 1.3* 1.2 0.9   PT/INR Recent Labs    01/24/23 0338  LABPROT 17.2*  INR 1.4*    Assessment: 77 year old man with past medical history significant for hypertension, history of CVA, dyslipidemia, obesity, gastroesophageal reflux disease, 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  GI is following the patient for anemia without overt bleed and elevated liver enzymes in setting of A-fib with RVR   Elevated LFTs:  Slowly improving.  Max with AST 671, ALT 692, alk phos 697, total bilirubin 1.3 on 01/19/2023.  Today, AST 46, ALT 162, alk phos 584, total bilirubin 0.9.  Etiology is likely multifactorial in the setting of congestive hepatopathy, ischemic injury in the setting of elevated LDH and elevated troponin's, metastatic disease to the bone.   Extensive evaluation has included negative acute viral hepatitis panel, Hep B DNA, Hep C RNA, autoimmune labs negative, normal CK, ferritin elevated at 4258 and iron saturation elevated at 57% with hemochromatosis DNA pending though this is likely reactive. CMV IgM negative, HSV1 IgG negative, HSV2 IgG positive. EBV IgG >600, CMV negative.  Ultrasound with normal-appearing liver.  HIDA with evidence of biliary dyskinesia, but would not expect gallbladder to be the main culprit of his elevated LFTs.  Anemia: Steadily declining since June 2024. Hemoglobin 7.3 on admission. Dropped to  6.6 and he received 1 unit of packed red blood cells. Hgb has remained stable at 8.0 today. No evidence of iron deficiency though admits to taking Aleve daily for the last several years which increases risk for PUD. No EGD or colonoscopy at least 10 years.  We have recommended EGD and colonoscopy outpatient once recovered from acute illness.   Epigastric pain/nausea/vomiting: Symptoms essentially resolved  aside from tenderness in the epigastric area on exam.  No postprandial symptoms at this point.  HIDA this admission consistent with biliary dyskinesia which could certainly be contributing to his symptoms though he may also have NSAID induced gastritis, duodenitis, and/or peptic ulcer disease.  Will be performing EGD in the outpatient setting which will aid in his evaluation.  He is currently on PPI twice daily which he should continue.  Recommend avoiding NSAIDs and following a low-fat diet.    Plan: Continue to trend LFTs daily. Will add INR for tomorrow as well.  Follow-up on hemochromatosis DNA Monitor H&H and for overt GI bleeding. Continue PPI twice daily. Avoid NSAIDs.  Low fat diet.  Recommend outpatient EGD and colonoscopy.   LOS: 8 days    01/26/2023, 11:29 AM   Ermalinda Memos, PA-C [facility]

## 2023-01-26 NOTE — Progress Notes (Addendum)
Mobility Specialist Progress Note:    01/26/23 1150  Mobility  Activity Stood at bedside;Dangled on edge of bed  Level of Assistance Minimal assist, patient does 75% or more  Assistive Device Front wheel walker  Range of Motion/Exercises Active;All extremities  Activity Response Tolerated fair  Mobility Referral Yes  $Mobility charge 1 Mobility  Mobility Specialist Start Time (ACUTE ONLY) 1150  Mobility Specialist Stop Time (ACUTE ONLY) 1200  Mobility Specialist Time Calculation (min) (ACUTE ONLY) 10 min   Pt received in bed, agreeable to mobility. Required MinA to stand EOB, deferred ambulation d/t dizziness. Sat pt EOB until dizziness resolved. Recovered, returned pt supine.Tolerated fair, no other symptoms. All needs met.   Lawerance Bach Mobility Specialist Please contact via Special educational needs teacher or  Rehab office at [phone]

## 2023-01-26 NOTE — Plan of Care (Signed)

## 2023-01-26 NOTE — Telephone Encounter (Signed)
Luis Donovan:  Patient needs 2-week hospital follow-up.  He is still currently admitted to the hospital, but GI service is signing off.  Please contact patient or his wife to arrange follow-up.

## 2023-01-27 ENCOUNTER — Other Ambulatory Visit (HOSPITAL_COMMUNITY): Payer: Self-pay | Admitting: Student in an Organized Health Care Education/Training Program

## 2023-01-27 DIAGNOSIS — E871 Hypo-osmolality and hyponatremia: Secondary | ICD-10-CM | POA: Diagnosis not present

## 2023-01-27 DIAGNOSIS — R7401 Elevation of levels of liver transaminase levels: Secondary | ICD-10-CM | POA: Diagnosis not present

## 2023-01-27 DIAGNOSIS — C61 Malignant neoplasm of prostate: Secondary | ICD-10-CM

## 2023-01-27 DIAGNOSIS — I480 Paroxysmal atrial fibrillation: Secondary | ICD-10-CM | POA: Diagnosis not present

## 2023-01-27 DIAGNOSIS — R7989 Other specified abnormal findings of blood chemistry: Secondary | ICD-10-CM | POA: Diagnosis not present

## 2023-01-27 LAB — COMPREHENSIVE METABOLIC PANEL
ALT: 133 U/L — ABNORMAL HIGH (ref 0–44)
AST: 43 U/L — ABNORMAL HIGH (ref 15–41)
Albumin: 3.6 g/dL (ref 3.5–5.0)
Alkaline Phosphatase: 624 U/L — ABNORMAL HIGH (ref 38–126)
Anion gap: 13 (ref 5–15)
BUN: 56 mg/dL — ABNORMAL HIGH (ref 8–23)
CO2: 27 mmol/L (ref 22–32)
Calcium: 8.6 mg/dL — ABNORMAL LOW (ref 8.9–10.3)
Chloride: 89 mmol/L — ABNORMAL LOW (ref 98–111)
Creatinine, Ser: 1.89 mg/dL — ABNORMAL HIGH (ref 0.61–1.24)
GFR, Estimated: 37 mL/min — ABNORMAL LOW (ref >60)
Glucose, Bld: 116 mg/dL — ABNORMAL HIGH (ref 70–99)
Potassium: 3.5 mmol/L (ref 3.5–5.1)
Sodium: 129 mmol/L — ABNORMAL LOW (ref 135–145)
Total Bilirubin: 0.8 mg/dL (ref 0.3–1.2)
Total Protein: 6.8 g/dL (ref 6.5–8.1)

## 2023-01-27 LAB — MAGNESIUM: Magnesium: 2.8 mg/dL — ABNORMAL HIGH (ref 1.7–2.4)

## 2023-01-27 MED ORDER — BISOPROLOL FUMARATE 10 MG PO TABS: 10.0000 mg | ORAL_TABLET | Freq: Every day | ORAL | 2 refills | Status: DC

## 2023-01-27 MED ORDER — TORSEMIDE 40 MG PO TABS: 40.0000 mg | ORAL_TABLET | Freq: Every day | ORAL | 2 refills | Status: DC

## 2023-01-27 MED ORDER — POTASSIUM CHLORIDE CRYS ER 10 MEQ PO TBCR: 10.0000 meq | EXTENDED_RELEASE_TABLET | ORAL | 1 refills | Status: AC

## 2023-01-27 NOTE — Progress Notes (Signed)
Mobility Specialist Progress Note:    01/27/23 1139  Mobility  Activity Transferred from bed to chair  Level of Assistance Contact guard assist, steadying assist  Assistive Device Front wheel walker  Distance Ambulated (ft) 4 ft  Range of Motion/Exercises Active;All extremities  Activity Response Tolerated well  Mobility Referral Yes  $Mobility charge 1 Mobility  Mobility Specialist Start Time (ACUTE ONLY) 1115  Mobility Specialist Stop Time (ACUTE ONLY) 1125  Mobility Specialist Time Calculation (min) (ACUTE ONLY) 10 min   Pt received in bed, agreeable to mobility. Required CGA to stand and ambulate with RW. Tolerated well, deferred further ambulation d/t dizziness. Recovered, left pt in chair. Alarm on, family in room. All needs met.   Lawerance Bach Mobility Specialist Please contact via Special educational needs teacher or  Rehab office at [phone]

## 2023-01-27 NOTE — Progress Notes (Signed)
Patient has rested well, has had no complaints of pain or distress.  Patient was in a. Fib but after patient fell asleep, heart rate decreased and was in sinus rhythm.  Patient has voided atleast 1000 cc this shift and has had a bowel movement.  No acute events over night.

## 2023-01-27 NOTE — Progress Notes (Signed)
[facility] Progress Note    Assessment/ Plan:   AKI on CKD3 - AKI likely secondary to third spacing/decreased EABV. Peak Cr 2.75. Cr stable today at 1.89. Remains nonoliguric with good response to diuretics - resolving with supportive care  CKD stage 3 - Baseline Cr 1.3 - 1.5  Hyponatremia -hypervolemic. Na as low as 120.  Improved with diuresis.   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 LFTs improving  Normocytic Anemia -transfuse PRN for hgb <7 -avoid ESAs given metastatic disease  Acute on chronic HFpEF. H/O AICD -vol status seems to be improving - can transition back to oral diuretics.  He was admitted on torsemide 20 mg BID.  For now would recommend torsemide 40 mg daily with another dose if the afternoon as needed for weight gain of 1-2 lbs in one day or 5 lbs in one week    PAF -cardiology following. 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 S/p heparin gtt, now on eliquis. Per primary   Disposition - per primary team.  Nephrology will sign off.  Stable for discharge from a strictly renal standpoint.  Follow-up with PCP at the [facility] within one week.  He would prefer follow-up with nephrology through the [facility] which is reasonable; he does not currently follow with them and would need a referral on follow-up with PCP.  We are happy to see him if needed    Subjective:   He feels well.  His wife is at bedside. he had 2.2 liters UOP charted over 9/10.  Meeting him today.  Question accuracy of some charted weights but weight 95.3 kg vs. 98.6 kg on 9/8 and had been 103 - 108 kg on checks prior to that.  Unreconciled med list indicates torsemide 20 mg BID was his home regimen. Patient and his wife  confirm this regimen.  He has his care through the [place] and would prefer to start there for follow-up   Review of systems:  Shortness of breath is improved  Denies n/v Denies chest pain      Objective:   BP 138/78   Pulse 65   Temp 98.3 F (36.8 C)   Resp 18   Ht 5\' 7"  (1.702 m)   Wt 95.3 kg   SpO2 96%   BMI 32.91 kg/m   Intake/Output Summary (Last 24 hours) at 01/27/2023 1000 Last data filed at 01/27/2023 0900 Gross per 24 hour  Intake 1320 ml  Output 2550 ml  Net -1230 ml   Weight change: 1.2 kg  Physical Exam:   General adult male in bed in no acute distress HEENT normocephalic atraumatic extraocular movements intact sclera anicteric Neck supple trachea midline Lungs clear to auscultation bilaterally normal work of breathing at rest  Heart S1S2 no rub Abdomen soft nontender obese habitus Extremities no edema  Psych normal mood and affect Neuro alert and oriented x 3 provides hx and follows commands   Imaging: No results found.  Labs: BMET Recent Labs  Lab 01/21/23 0457 01/22/23 0501 01/23/23 0407 01/24/23 0338 01/25/23 0416 01/26/23 0359 01/27/23 0418  NA 122* 120* 121* 123* 128* 128* 129*  K 5.4* 5.6* 5.7* 3.9 3.3* 4.1 3.5  CL 86* 85* 85* 88* 87* 92* 89*  CO2 24 24 24 24 27 25  27  GLUCOSE 85 91 87 112* 96 106* 116*  BUN 56* 63* 65* 62* 58* 58* 56*  CREATININE 2.05* 2.10* 2.08* 1.96* 1.94* 1.92* 1.89*  CALCIUM 7.8* 8.1* 8.2* 8.2* 8.4* 8.2* 8.6*   CBC Recent Labs  Lab 01/23/23 0407 01/24/23 0338 01/25/23 0416 01/26/23 0359  WBC 9.2 7.4 7.0 6.2  HGB 7.7* 7.9* 8.1* 8.0*  HCT 24.2* 26.1* 26.2* 26.5*  MCV 96.0 98.5 98.9 100.4*  PLT 250 236 232 179    Medications:     apixaban  5 mg Oral BID   bisoprolol  10 mg Oral Daily   feeding supplement  237 mL Oral BID BM   latanoprost  1 drop Both Eyes QHS   multivitamin with minerals  1 tablet Oral Daily   oxyCODONE  10 mg Oral Q12H   pantoprazole  40 mg Oral BID   polyethylene glycol  17 g Oral  BID   polyvinyl alcohol  1 drop Both Eyes BID   timolol  1 drop Both Eyes Daily   traZODone  50 mg Oral QHS     Estanislado Emms, MD [facility] 01/27/2023, 10:21 AM

## 2023-01-27 NOTE — Discharge Instructions (Addendum)
Please stop lipitor (atorvastatin) until your liver enzymes have returned to normal.   Please follow up with [facility] to have your liver enzymes rechecked and they will let you know when it is safe to restart lipitor (atorvastatin)   IMPORTANT INFORMATION: PAY CLOSE ATTENTION   PHYSICIAN DISCHARGE INSTRUCTIONS  Follow with Primary care provider  Clinic, Lenn Sink  and other consultants as instructed by your Hospitalist Physician  SEEK MEDICAL CARE OR RETURN TO EMERGENCY ROOM IF SYMPTOMS COME BACK, WORSEN OR NEW PROBLEM DEVELOPS   Please note: You were cared for by a hospitalist during your hospital stay. Every effort will be made to forward records to your primary care provider.  You can request that your primary care provider send for your hospital records if they have not received them.  Once you are discharged, your primary care physician will handle any further medical issues. Please note that NO REFILLS for any discharge medications will be authorized once you are discharged, as it is imperative that you return to your primary care physician (or establish a relationship with a primary care physician if you do not have one) for your post hospital discharge needs so that they can reassess your need for medications and monitor your lab values.  Please get a complete blood count and chemistry panel checked by your Primary MD at your next visit, and again as instructed by your Primary MD.  Get Medicines reviewed and adjusted: Please take all your medications with you for your next visit with your Primary MD  Laboratory/radiological data: Please request your Primary MD to go over all hospital tests and procedure/radiological results at the follow up, please ask your primary care provider to get all Hospital records sent to his/her office.  In some cases, they will be blood work, cultures and biopsy results pending at the time of your discharge. Please request that your primary care  provider follow up on these results.  If you are diabetic, please bring your blood sugar readings with you to your follow up appointment with primary care.    Please call and make your follow up appointments as soon as possible.    Also Note the following: If you experience worsening of your admission symptoms, develop shortness of breath, life threatening emergency, suicidal or homicidal thoughts you must seek medical attention immediately by calling 911 or calling your MD immediately  if symptoms less severe.  You must read complete instructions/literature along with all the possible adverse reactions/side effects for all the Medicines you take and that have been prescribed to you. Take any new Medicines after you have completely understood and accpet all the possible adverse reactions/side effects.   Do not drive when taking Pain medications or sleeping medications (Benzodiazepines)  Do not take more than prescribed Pain, Sleep and Anxiety Medications. It is not advisable to combine anxiety,sleep and pain medications without talking with your primary care practitioner  Special Instructions: If you have smoked or chewed Tobacco  in the last 2 yrs please stop smoking, stop any regular Alcohol  and or any Recreational drug use.  Wear Seat belts while driving.  Do not drive if taking any narcotic, mind altering or controlled substances or recreational drugs or alcohol.

## 2023-01-27 NOTE — Discharge Summary (Signed)
Physician Discharge Summary  Luis Donovan ZOX:[id number] DOB: May 27, 1945 DOA: 01/17/2023  PCP: [facility]  Cardiology: [facility]   Admit date: 01/17/2023 Discharge date: 01/27/2023  Admitted From:  Home  Disposition:  Home   Recommendations for Outpatient Follow-up:  Follow up with PCP in 1 weeks PCP at [facility] should refer to nephrologist in the [place] per pt request [facility]  Please follow up with [facility] in 2 weeks  Please check CMP/CBC in 1-2 weeks to follow up LFTs Hold atorvastatin until LFTs have normalized   Discharge Condition: STABLE   CODE STATUS: FULL DIET: resume heart healthy low sodium    Brief Hospitalization Summary: Please see all hospital notes, images, labs for full details of the hospitalization. ADMISSION PROVIDER HPI:  77 y.o. male with medical history significant of hypertension, hyperlipidemia, known metastatic disease with unknown primary, GERD, PTSD, OSA, CVA, anemia of chronic disease who presents to the emergency department accompanied by wife due to shortness of breath which has been ongoing for several weeks, but worsened last couple of days.  Exertion and walking aggravates the shortness of breath, he also complained of fatigue and dizziness.  Patient was admitted from 8/14 to 8/16 at [facility] long due to acute kidney injury secondary to decreased appetite associated with recent COVID infection.  He was discharged home with home health PT.  Patient states he has been having this shortness of breath since last discharge from the hospital.  He continues to get some ongoing diuresis per nephrology and should be able to discharge by 9/11 if labs remain stable.  HOSPITAL COURSE BY PROBLEM LIST  Acute HFpEF - suspect triggered by an episode of A-fib RVR - He was treated with IV diuresis with furosemide with good results - Nephrology team managed diuresis given AKI - Discussed with nephrology and the  recommendation is for him to discharge on torsemide 40 mg daily, with instructions that he can take an additional dose in the afternoon for a weight gain of 1 to 2 pounds in 1 day or 5 pounds in 1 week. - Patient reports he is feeling better and eager to discharge home  Transaminitis - GI consulted and felt related to hepatic congestion  - holding atorvastatin temporarily until LFTs normalize - Pt to follow up with [facility] in 2 weeks   GERD - continue PPI - outpatient EGD/colonoscopy per GI service  Bladder Mass - outpatient urology planned for further biopsy and work up thru [place]  Elevated D-dimer - Positive intermediate probability VQ scan for PE  - Patient treated with IV heparin infusion and now transition back over to apixaban  PAF - cardiology consulted - he is on bisoprolol 10 mg daily for HR control - he is on apixaban 5 mg BID for full anticoagulation   Hypokalemia  - from diuresis  - K down to 3.5  - potassium supplement recommended with diuresis - recommend rechecking potassium in 1 week   Hyponatremia - improved with diuresis - sodium up from 120 now to 129  - recheck in 1-2 weeks recommended  Stage 3b CKD - pt says he prefers to follow up with nephrologist in the [facility] - pt advised he will need his PCP at [facility] to refer him to nephrology  Discharge Diagnoses:  Principal Problem:   Hyponatremia Active Problems:   Essential (primary) hypertension   Hyperlipidemia, unspecified   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  Acute kidney injury (nontraumatic) (HCC)   Shortness of breath   GERD without esophagitis   Elevated d-dimer   Hyperkalemia   Elevated transaminase level   Elevated alkaline phosphatase in newborn   PAF (paroxysmal atrial fibrillation) (HCC)   Demand ischemia   Anemia   Discharge Instructions:  Allergies as of 01/27/2023   No Known Allergies      Medication List     STOP taking these medications    atorvastatin 80 MG  tablet Commonly known as: LIPITOR   hydrALAZINE 50 MG tablet Commonly known as: APRESOLINE   losartan 100 MG tablet Commonly known as: COZAAR   metoprolol succinate 50 MG 24 hr tablet Commonly known as: TOPROL-XL       TAKE these medications    acetaminophen 325 MG tablet Commonly known as: TYLENOL Take 2 tablets (650 mg total) by mouth every 6 (six) hours as needed for mild pain (or Fever >/= 101).   albuterol 108 (90 Base) MCG/ACT inhaler Commonly known as: VENTOLIN HFA Inhale 2 puffs into the lungs every 4 (four) hours as needed for wheezing or shortness of breath.   bisoprolol 10 MG tablet Commonly known as: ZEBETA Take 1 tablet (10 mg total) by mouth daily. Start taking on: January 28, 2023   Carboxymethylcellulose Sod PF 0.5 % Soln Place 1 drop into both eyes 2 (two) times daily. For dry eyes   cetirizine 10 MG tablet Commonly known as: ZYRTEC Take 10 mg by mouth at bedtime.   Cholecalciferol 50 MCG (2000 UT) Tabs Take 1 tablet by mouth every morning.   clotrimazole 1 % cream Commonly known as: LOTRIMIN Apply 1 Application topically 2 (two) times daily. Apply a small amount to affected area twice daily for feet.   cyclobenzaprine 5 MG tablet Commonly known as: FLEXERIL Take 1 tablet (5 mg total) by mouth 3 (three) times daily as needed for muscle spasms.   diclofenac Sodium 1 % Gel Commonly known as: VOLTAREN Apply 2 g topically 4 (four) times daily as needed (pain).   Eliquis 5 MG Tabs tablet Generic drug: apixaban Take 5 mg by mouth 2 (two) times daily.   eucerin cream Apply 1 Application topically 2 (two) times daily. Apply a small amount to affected area twice daily   FLUoxetine 20 MG capsule Commonly known as: PROZAC Take 80 mg by mouth daily.   KETOTIFEN FUMARATE OP Apply 1 drop to eye 2 (two) times daily as needed (allergies). 0.025%   latanoprost 0.005 % ophthalmic solution Commonly known as: XALATAN Place 1 drop into both eyes at  bedtime.   lidocaine 5 % Commonly known as: LIDODERM Place 1 patch onto the skin daily. Apply 1 patch to skin once daily (Apply for  hours, then remove for  hours)   omeprazole 20 MG capsule Commonly known as: PRILOSEC Take 20 mg by mouth 2 (two) times daily before a meal. Take on an empty stomach 30 minutes prior to a meal   ondansetron 8 MG tablet Commonly known as: ZOFRAN Take 1 tablet (8 mg total) by mouth every 8 (eight) hours as needed for nausea or vomiting.   Oxycodone HCl 10 MG Tabs Take 1 tablet (10 mg total) by mouth every 4 (four) hours as needed for up to 15 days.   polyethylene glycol 17 g packet Commonly known as: MIRALAX / GLYCOLAX Take 17 g by mouth 2 (two) times daily.   potassium chloride 10 MEQ tablet Commonly known as: KLOR-CON M Take 1 tablet (  10 mEq total) by mouth every other day. Start taking on: January 28, 2023   timolol 0.5 % ophthalmic gel-forming Commonly known as: TIMOPTIC-XR Apply 1 drop to eye daily.   Torsemide 40 MG Tabs Take 40 mg by mouth daily. Take 1 additional dose in the afternoon as needed for weight gain of 1-2 lbs in one day or 5 lbs in one week Start taking on: January 28, 2023 What changed:  medication strength how much to take when to take this additional instructions   traZODone 50 MG tablet Commonly known as: DESYREL Take 50 mg by mouth at bedtime.   Xtampza ER 9 MG C12a Generic drug: oxyCODONE ER Take 9 mg by mouth every 12 (twelve) hours.        Follow-up Information     [facility] in 1 week(s).   Why: Hospital Follow Up Contact information: [address] [facility] [place] [address] [phone]         [facility] Cardiologist. Schedule an appointment as soon as possible for a visit in 2 week(s).   Why: Hospital Follow Up               No Known Allergies Allergies as of 01/27/2023   No Known Allergies      Medication List     STOP taking these  medications    atorvastatin 80 MG tablet Commonly known as: LIPITOR   hydrALAZINE 50 MG tablet Commonly known as: APRESOLINE   losartan 100 MG tablet Commonly known as: COZAAR   metoprolol succinate 50 MG 24 hr tablet Commonly known as: TOPROL-XL       TAKE these medications    acetaminophen 325 MG tablet Commonly known as: TYLENOL Take 2 tablets (650 mg total) by mouth every 6 (six) hours as needed for mild pain (or Fever >/= 101).   albuterol 108 (90 Base) MCG/ACT inhaler Commonly known as: VENTOLIN HFA Inhale 2 puffs into the lungs every 4 (four) hours as needed for wheezing or shortness of breath.   bisoprolol 10 MG tablet Commonly known as: ZEBETA Take 1 tablet (10 mg total) by mouth daily. Start taking on: January 28, 2023   Carboxymethylcellulose Sod PF 0.5 % Soln Place 1 drop into both eyes 2 (two) times daily. For dry eyes   cetirizine 10 MG tablet Commonly known as: ZYRTEC Take 10 mg by mouth at bedtime.   Cholecalciferol 50 MCG (2000 UT) Tabs Take 1 tablet by mouth every morning.   clotrimazole 1 % cream Commonly known as: LOTRIMIN Apply 1 Application topically 2 (two) times daily. Apply a small amount to affected area twice daily for feet.   cyclobenzaprine 5 MG tablet Commonly known as: FLEXERIL Take 1 tablet (5 mg total) by mouth 3 (three) times daily as needed for muscle spasms.   diclofenac Sodium 1 % Gel Commonly known as: VOLTAREN Apply 2 g topically 4 (four) times daily as needed (pain).   Eliquis 5 MG Tabs tablet Generic drug: apixaban Take 5 mg by mouth 2 (two) times daily.   eucerin cream Apply 1 Application topically 2 (two) times daily. Apply a small amount to affected area twice daily   FLUoxetine 20 MG capsule Commonly known as: PROZAC Take 80 mg by mouth daily.   KETOTIFEN FUMARATE OP Apply 1 drop to eye 2 (two) times daily as needed (allergies). 0.025%   latanoprost 0.005 % ophthalmic solution Commonly known as:  XALATAN Place 1 drop into both eyes at bedtime.  lidocaine 5 % Commonly known as: LIDODERM Place 1 patch onto the skin daily. Apply 1 patch to skin once daily (Apply for  hours, then remove for  hours)   omeprazole 20 MG capsule Commonly known as: PRILOSEC Take 20 mg by mouth 2 (two) times daily before a meal. Take on an empty stomach 30 minutes prior to a meal   ondansetron 8 MG tablet Commonly known as: ZOFRAN Take 1 tablet (8 mg total) by mouth every 8 (eight) hours as needed for nausea or vomiting.   Oxycodone HCl 10 MG Tabs Take 1 tablet (10 mg total) by mouth every 4 (four) hours as needed for up to 15 days.   polyethylene glycol 17 g packet Commonly known as: MIRALAX / GLYCOLAX Take 17 g by mouth 2 (two) times daily.   potassium chloride 10 MEQ tablet Commonly known as: KLOR-CON M Take 1 tablet (10 mEq total) by mouth every other day. Start taking on: January 28, 2023   timolol 0.5 % ophthalmic gel-forming Commonly known as: TIMOPTIC-XR Apply 1 drop to eye daily.   Torsemide 40 MG Tabs Take 40 mg by mouth daily. Take 1 additional dose in the afternoon as needed for weight gain of 1-2 lbs in one day or 5 lbs in one week Start taking on: January 28, 2023 What changed:  medication strength how much to take when to take this additional instructions   traZODone 50 MG tablet Commonly known as: DESYREL Take 50 mg by mouth at bedtime.   Xtampza ER 9 MG C12a Generic drug: oxyCODONE ER Take 9 mg by mouth every 12 (twelve) hours.        Procedures/Studies: ECHOCARDIOGRAM COMPLETE  Result Date: 01/21/2023    ECHOCARDIOGRAM REPORT   Patient Name:   HAIDER CARRERA Date of Exam: 01/21/2023 Medical Rec #:  [id number]      Height:       67.0 in Accession #:    [id number]     Weight:       229.3 lb Date of Birth:  03/01/1946     BSA:          2.143 m Patient Age:    76 years       BP:           154/61 mmHg Patient Gender: M              HR:           66 bpm. Exam  [facility] Procedure: 2D Echo, Cardiac Doppler and Color Doppler Indications:    Syncope R55  History:        Patient has no prior history of Echocardiogram examinations.                 Acute MI, Signs/Symptoms:Shortness of Breath; Risk                 Factors:Hypertension, Dyslipidemia, Former Smoker and Sleep                 Apnea.  Sonographer:    Aron Baba Referring Phys: [id number] JAN A MANSY  Sonographer Comments: Patient is obese. Image acquisition challenging due to respiratory motion. IMPRESSIONS  1. Left ventricular ejection fraction, by estimation, is 50 to 55%. The left ventricle has low normal function. The left ventricle has no regional wall motion abnormalities. There is mild left ventricular hypertrophy. Left ventricular diastolic parameters are indeterminate.  2. Right ventricular systolic function is low normal. The  right ventricular size is mildly enlarged. There is moderately elevated pulmonary artery systolic pressure.  3. Left atrial size was moderately dilated.  4. The mitral valve is abnormal. Mild to moderate mitral valve regurgitation. No evidence of mitral stenosis.  5. The tricuspid valve is abnormal. Tricuspid valve regurgitation is mild to moderate.  6. The aortic valve is tricuspid. Aortic valve regurgitation is not visualized. No aortic stenosis is present.  7. The pulmonic valve was abnormal.  8. The inferior vena cava is dilated in size with <50% respiratory variability, suggesting right atrial pressure of 15 mmHg. FINDINGS  Left Ventricle: Left ventricular ejection fraction, by estimation, is 50 to 55%. The left ventricle has low normal function. The left ventricle has no regional wall motion abnormalities. The left ventricular internal cavity size was normal in size. There is mild left ventricular hypertrophy. Left ventricular diastolic parameters are indeterminate. Right Ventricle: The right ventricular size is mildly enlarged. Right vetricular wall thickness was not  well visualized. Right ventricular systolic function is low normal. There is moderately elevated pulmonary artery systolic pressure. The tricuspid regurgitant velocity is 2.99 m/s, and with an assumed right atrial pressure of 15 mmHg, the estimated right ventricular systolic pressure is 50.8 mmHg. Left Atrium: Left atrial size was moderately dilated. Right Atrium: Right atrial size was normal in size. Pericardium: There is no evidence of pericardial effusion. Mitral Valve: The mitral valve is abnormal. There is mild thickening of the mitral valve leaflet(s). There is mild calcification of the mitral valve leaflet(s). Mild mitral annular calcification. Mild to moderate mitral valve regurgitation. No evidence of mitral valve stenosis. MV peak gradient, 6.2 mmHg. The mean mitral valve gradient is 2.0 mmHg. Tricuspid Valve: The tricuspid valve is abnormal. Tricuspid valve regurgitation is mild to moderate. No evidence of tricuspid stenosis. Aortic Valve: The aortic valve is tricuspid. Aortic valve regurgitation is not visualized. No aortic stenosis is present. Aortic valve mean gradient measures 7.0 mmHg. Aortic valve peak gradient measures 15.0 mmHg. Aortic valve area, by VTI measures 1.77  cm. Pulmonic Valve: The pulmonic valve was abnormal. Pulmonic valve regurgitation is mild. No evidence of pulmonic stenosis. Aorta: The ascending aorta was not well visualized and the aortic root is normal in size and structure. Venous: The inferior vena cava is dilated in size with less than 50% respiratory variability, suggesting right atrial pressure of 15 mmHg. IAS/Shunts: No atrial level shunt detected by color flow Doppler. Additional Comments: A device lead is visualized in the right ventricle and right atrium.  LEFT VENTRICLE PLAX 2D LVIDd:         4.70 cm   Diastology LVIDs:         3.20 cm   LV e' medial:    4.82 cm/s LV PW:         1.10 cm   LV E/e' medial:  21.6 LV IVS:        1.00 cm   LV e' lateral:   5.52 cm/s LVOT  diam:     1.90 cm   LV E/e' lateral: 18.8 LV SV:         58 LV SV Index:   27 LVOT Area:     2.84 cm  RIGHT VENTRICLE RV S prime:     9.37 cm/s TAPSE (M-mode): 1.6 cm LEFT ATRIUM              Index        RIGHT ATRIUM  Index LA diam:        4.90 cm  2.29 cm/m   RA Area:     16.70 cm LA Vol (A2C):   50.5 ml  23.56 ml/m  RA Volume:   45.40 ml  21.18 ml/m LA Vol (A4C):   159.0 ml 74.19 ml/m LA Biplane Vol: 95.3 ml  44.47 ml/m  AORTIC VALVE                     PULMONIC VALVE AV Area (Vmax):    1.57 cm      PR End Diast Vel: 4.16 msec AV Area (Vmean):   1.61 cm AV Area (VTI):     1.77 cm AV Vmax:           193.43 cm/s AV Vmean:          123.260 cm/s AV VTI:            0.326 m AV Peak Grad:      15.0 mmHg AV Mean Grad:      7.0 mmHg LVOT Vmax:         107.00 cm/s LVOT Vmean:        69.800 cm/s LVOT VTI:          0.203 m LVOT/AV VTI ratio: 0.62  AORTA Ao Root diam: 3.30 cm MITRAL VALVE                 TRICUSPID VALVE MV Area (PHT): 5.23 cm      TR Peak grad:   35.8 mmHg MV Peak grad:  6.2 mmHg      TR Vmax:        299.00 cm/s MV Mean grad:  2.0 mmHg MV Vmax:       1.24 m/s      SHUNTS MV Vmean:      66.5 cm/s     Systemic VTI:  0.20 m MV Decel Time: 145 msec      Systemic Diam: 1.90 cm MR Peak grad:   103.0 mmHg MR Mean grad:   71.0 mmHg MR Vmax:        507.50 cm/s MR Vmean:       393.0 cm/s MR PISA:        0.25 cm MR PISA Radius: 0.20 cm MV E velocity: 104.00 cm/s MV A velocity: 108.00 cm/s MV E/A ratio:  0.96 Dina Rich MD Electronically signed by Dina Rich MD Signature Date/Time: 01/21/2023/1:46:28 PM    Final    NM Hepato W/EF  Result Date: 01/20/2023 CLINICAL DATA:  Elevated liver function tests.  Pain and nausea EXAM: NUCLEAR MEDICINE HEPATOBILIARY IMAGING WITH GALLBLADDER EF TECHNIQUE: Sequential images of the abdomen were obtained out to 60 minutes following intravenous administration of radiopharmaceutical. After slow intravenous infusion of 2.18 micrograms Cholecystokinin,  gallbladder ejection fraction was determined. RADIOPHARMACEUTICALS:  5.2 mCi Tc-16m Choletec IV COMPARISON:  Ultrasound 01/18/2019 FINDINGS: Prompt uptake and biliary excretion of activity by the liver is seen. Gallbladder activity is visualized, consistent with patency of cystic duct. Biliary activity passes into small bowel, consistent with patent common bile duct. Calculated gallbladder ejection fraction is 7%. (At 60 min, normal ejection fraction is greater than 40%.) IMPRESSION: No common duct or cystic duct obstruction. Poor gallbladder ejection fraction however of only 7%. Electronically Signed   By: Karen Kays M.D.   On: 01/20/2023 13:58   US Venous Img Lower Bilateral (DVT)  Result Date: 01/20/2023 CLINICAL DATA:  Dyspnea, short of breath, lower extremity  edema EXAM: BILATERAL LOWER EXTREMITY VENOUS DOPPLER ULTRASOUND TECHNIQUE: Gray-scale sonography with graded compression, as well as color Doppler and duplex ultrasound were performed to evaluate the lower extremity deep venous systems from the level of the common femoral vein and including the common femoral, femoral, profunda femoral, popliteal and calf veins including the posterior tibial, peroneal and gastrocnemius veins when visible. The superficial great saphenous vein was also interrogated. Spectral Doppler was utilized to evaluate flow at rest and with distal augmentation maneuvers in the common femoral, femoral and popliteal veins. COMPARISON:  None Available. FINDINGS: RIGHT LOWER EXTREMITY Common Femoral Vein: No evidence of thrombus. Normal compressibility, respiratory phasicity and response to augmentation. Saphenofemoral Junction: No evidence of thrombus. Normal compressibility and flow on color Doppler imaging. Profunda Femoral Vein: No evidence of thrombus. Normal compressibility and flow on color Doppler imaging. Femoral Vein: No evidence of thrombus. Normal compressibility, respiratory phasicity and response to augmentation.  Popliteal Vein: No evidence of thrombus. Normal compressibility, respiratory phasicity and response to augmentation. Calf Veins: No evidence of thrombus. Normal compressibility and flow on color Doppler imaging. Superficial Great Saphenous Vein: No evidence of thrombus. Normal compressibility. Venous Reflux:  None. Other Findings:  None. LEFT LOWER EXTREMITY Common Femoral Vein: No evidence of thrombus. Normal compressibility, respiratory phasicity and response to augmentation. Saphenofemoral Junction: No evidence of thrombus. Normal compressibility and flow on color Doppler imaging. Profunda Femoral Vein: No evidence of thrombus. Normal compressibility and flow on color Doppler imaging. Femoral Vein: No evidence of thrombus. Normal compressibility, respiratory phasicity and response to augmentation. Popliteal Vein: No evidence of thrombus. Normal compressibility, respiratory phasicity and response to augmentation. Calf Veins: No evidence of thrombus. Normal compressibility and flow on color Doppler imaging. Superficial Great Saphenous Vein: No evidence of thrombus. Normal compressibility. Venous Reflux:  None. Other Findings:  None. IMPRESSION: No evidence of deep venous thrombosis in either lower extremity. Electronically Signed   By: Malachy Moan M.D.   On: 01/20/2023 13:29   US Abdomen Limited  Result Date: 01/18/2023 CLINICAL DATA:  Nausea for 4 days, increased liver function tests. EXAM: ULTRASOUND ABDOMEN LIMITED RIGHT UPPER QUADRANT COMPARISON:  CT abdomen and pelvis dated 12/13/2022. FINDINGS: Gallbladder: Gallbladder sludge is noted. The gallbladder wall is mildly thickened, measuring 5 mm. There is pericholecystic fluid. No sonographic Murphy sign noted by sonographer. Common bile duct: Diameter: 5 mm Liver: No focal lesion identified. Within normal limits in parenchymal echogenicity. Portal vein is patent on color Doppler imaging with normal direction of blood flow towards the liver. Other: None.  IMPRESSION: Gallbladder sludge with mild gallbladder wall thickening and pericholecystic fluid. Findings are suspicious for acute cholecystitis. Electronically Signed   By: Romona Curls M.D.   On: 01/18/2023 13:47   NM Pulmonary Perfusion  Result Date: 01/18/2023 CLINICAL DATA:  Pulmonary embolism (PE) suspected, low to intermediate prob, positive D-dimer. Shortness of breath EXAM: NUCLEAR MEDICINE PERFUSION LUNG SCAN TECHNIQUE: Perfusion images were obtained in multiple projections after intravenous injection of radiopharmaceutical. Ventilation scans intentionally deferred if perfusion scan and chest x-ray adequate for interpretation during COVID 19 epidemic. RADIOPHARMACEUTICALS:  4.4 mCi Tc-19m MAA IV COMPARISON:  Chest x-ray 01/17/2023 FINDINGS: Heterogeneous radiotracer distribution within both lungs. Bilateral nonsegmental perfusion defects. IMPRESSION: Intermediate probability for pulmonary embolism. Electronically Signed   By: Duanne Guess D.O.   On: 01/18/2023 10:14   CT Head Wo Contrast  Result Date: 01/18/2023 CLINICAL DATA:  Mental status change, unknown cause. EXAM: CT HEAD WITHOUT CONTRAST TECHNIQUE: Contiguous axial images were obtained from  the base of the skull through the vertex without intravenous contrast. RADIATION DOSE REDUCTION: This exam was performed according to the departmental dose-optimization program which includes automated exposure control, adjustment of the mA and/or kV according to patient size and/or use of iterative reconstruction technique. COMPARISON:  06/27/2013 FINDINGS: Brain: Area of old infarct and encephalomalacia within the posterior left parietal lobe, stable. No acute intracranial abnormality. Specifically, no hemorrhage, hydrocephalus, mass lesion, acute infarction, or significant intracranial injury. Vascular: No hyperdense vessel or unexpected calcification. Skull: No acute calvarial abnormality. 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 No acute intracranial abnormality. Electronically Signed   By: Charlett Nose M.D.   On: 01/18/2023 01:55   DG Chest 2 View  Result Date: 01/17/2023 CLINICAL DATA:  Shortness of breath EXAM: CHEST - 2 VIEW COMPARISON:  Radiographs 12/18/2022 FINDINGS: Left chest wall ICD. Stable cardiomediastinal silhouette. Low lung volumes accentuate pulmonary vascularity. No focal consolidation, pleural effusion, or pneumothorax. No displaced rib fractures. Left reverse TSA. IMPRESSION: No active cardiopulmonary disease. Electronically Signed   By: Minerva Fester M.D.   On: 01/17/2023 23:27     Subjective: Pt reports that he is ready to go home, he says she feels much better and eager to get out of the hospital.   Discharge Exam: Vitals:   01/26/23 2032 01/27/23 0402  BP: (!) 145/63 138/78  Pulse:  65  Resp: 20 18  Temp: 98.2 F (36.8 C) 98.3 F (36.8 C)  SpO2: 100% 96%   Vitals:   01/26/23 1737 01/26/23 2032 01/27/23 0402 01/27/23 0500  BP: 127/67 (!) 145/63 138/78   Pulse: 67  65   Resp:  20 18   Temp:  98.2 F (36.8 C) 98.3 F (36.8 C)   TempSrc:      SpO2: 94% 100% 96%   Weight:    95.3 kg  Height:       General: Pt is alert, awake, not in acute distress Cardiovascular: normal  S1/S2 +, no rubs, no gallops Respiratory: CTA bilaterally, no wheezing, no rhonchi Abdominal: Soft, NT, ND, bowel sounds + Extremities: trace pretibial edema, no cyanosis   The results of significant diagnostics from this hospitalization (including imaging, microbiology, ancillary and laboratory) are listed below for reference.     Microbiology: Recent Results (from the past 240 hour(s))  MRSA Next Gen by PCR, Nasal     Status: None   Collection Time: 01/18/23  4:22 AM   Specimen: Nasal Mucosa; Nasal Swab  Result Value Ref Range Status   MRSA by PCR Next Gen NOT DETECTED NOT DETECTED Final    Comment: (NOTE) The GeneXpert MRSA Assay (FDA  approved for NASAL specimens only), is one component of a comprehensive MRSA colonization surveillance program. It is not intended to diagnose MRSA infection nor to guide or monitor treatment for MRSA infections. Test performance is not FDA approved in patients less than 63 years old. Performed at [facility], [address] [place], [place] [address]      Labs: BNP (last 3 results) Recent Labs    11/02/22 1659 11/03/22 0943 01/22/23 0501  BNP 264.0* 386.0* 1,290.0*   Basic Metabolic Panel: Recent Labs  Lab 01/23/23 0407 01/24/23 0338 01/25/23 0416 01/26/23 0359 01/27/23 0418  NA 121* 123* 128* 128* 129*  K 5.7* 3.9 3.3* 4.1 3.5  CL 85* 88* 87* 92* 89*  CO2 24 24 27 25 27   GLUCOSE 87 112* 96 106* 116*  BUN  65* 62* 58* 58* 56*  CREATININE 2.08* 1.96* 1.94* 1.92* 1.89*  CALCIUM 8.2* 8.2* 8.4* 8.2* 8.6*  MG  --   --  3.1* 3.0* 2.8*   Liver Function Tests: Recent Labs  Lab 01/23/23 0407 01/24/23 0338 01/25/23 0416 01/26/23 0359 01/27/23 0418  AST 168* 106* 56* 46* 43*  ALT 312* 296* 209* 162* 133*  ALKPHOS 588* 616* 595* 584* 624*  BILITOT 1.4* 1.3* 1.2 0.9 0.8  PROT 6.6 6.6 6.6 6.7 6.8  ALBUMIN 3.2* 3.4* 3.5 3.6 3.6   No results for input(s): "LIPASE", "AMYLASE" in the last 168 hours. No results for input(s): "AMMONIA" in the last 168 hours. CBC: Recent Labs  Lab 01/22/23 0501 01/23/23 0407 01/24/23 0338 01/25/23 0416 01/26/23 0359  WBC 10.7* 9.2 7.4 7.0 6.2  HGB 7.7* 7.7* 7.9* 8.1* 8.0*  HCT 24.7* 24.2* 26.1* 26.2* 26.5*  MCV 95.4 96.0 98.5 98.9 100.4*  PLT 251 250 236 232 179   Cardiac Enzymes: No results for input(s): "CKTOTAL", "CKMB", "CKMBINDEX", "TROPONINI" in the last 168 hours. BNP: Invalid input(s): "POCBNP" CBG: Recent Labs  Lab 01/21/23 1242  GLUCAP 88   D-Dimer No results for input(s): "DDIMER" in the last 72 hours. Hgb A1c No results for input(s): "HGBA1C" in the last 72 hours. Lipid Profile No results for input(s): "CHOL",  "HDL", "LDLCALC", "TRIG", "CHOLHDL", "LDLDIRECT" in the last 72 hours. Thyroid function studies No results for input(s): "TSH", "T4TOTAL", "T3FREE", "THYROIDAB" in the last 72 hours.  Invalid input(s): "FREET3" Anemia work up No results for input(s): "VITAMINB12", "FOLATE", "FERRITIN", "TIBC", "IRON", "RETICCTPCT" in the last 72 hours. Urinalysis    Component Value Date/Time   COLORURINE AMBER (A) 01/18/2023 0211   APPEARANCEUR HAZY (A) 01/18/2023 0211   LABSPEC 1.018 01/18/2023 0211   PHURINE 5.0 01/18/2023 0211   GLUCOSEU NEGATIVE 01/18/2023 0211   HGBUR NEGATIVE 01/18/2023 0211   BILIRUBINUR NEGATIVE 01/18/2023 0211   KETONESUR NEGATIVE 01/18/2023 0211   PROTEINUR NEGATIVE 01/18/2023 0211   UROBILINOGEN 1.0 09/18/2010 1918   NITRITE NEGATIVE 01/18/2023 0211   LEUKOCYTESUR NEGATIVE 01/18/2023 0211   Sepsis Labs Recent Labs  Lab 01/23/23 0407 01/24/23 0338 01/25/23 0416 01/26/23 0359  WBC 9.2 7.4 7.0 6.2   Microbiology Recent Results (from the past 240 hour(s))  MRSA Next Gen by PCR, Nasal     Status: None   Collection Time: 01/18/23  4:22 AM   Specimen: Nasal Mucosa; Nasal Swab  Result Value Ref Range Status   MRSA by PCR Next Gen NOT DETECTED NOT DETECTED Final    Comment: (NOTE) The GeneXpert MRSA Assay (FDA approved for NASAL specimens only), is one component of a comprehensive MRSA colonization surveillance program. It is not intended to diagnose MRSA infection nor to guide or monitor treatment for MRSA infections. Test performance is not FDA approved in patients less than 23 years old. Performed at [facility], [address], [place], [place] [address]    Time coordinating discharge:  41 mins   SIGNED:  Standley Dakins, MD  [facility] 01/27/2023, 10:59 AM How to contact the [facility] Attending or Consulting provider 7A - 7P or covering provider during after hours 7P -7A, for this patient?  Check the care team in [facility] and look for a)  attending/consulting TR[facility] provider listed and b) the [facility] team listed Log into [email or web address] and use [place]'s universal password to access. If you do not have the password, please contact the hospital operator. Locate the [facility] provider you are looking for under  [facility] and page to a number that you can be directly reached. If you still have difficulty reaching the provider, please page the [facility] (Director on Call) for the Hospitalists listed on amion for assistance.

## 2023-01-28 ENCOUNTER — Telehealth: Payer: Self-pay | Admitting: Gastroenterology

## 2023-01-28 NOTE — Telephone Encounter (Signed)
Noted  

## 2023-01-28 NOTE — Telephone Encounter (Signed)
I called and spoke to the patient about scheduling a hospital f/u appt.  He said that he has an appointment with his cardiologist on 9/17 and he will have to call back to schedule.

## 2023-02-05 ENCOUNTER — Encounter (HOSPITAL_COMMUNITY)
Admission: RE | Admit: 2023-02-05 | Discharge: 2023-02-05 | Disposition: A | Discharge status: Home / Self Care | Payer: No Typology Code available for payment source | Source: Ambulatory Visit | Attending: Student in an Organized Health Care Education/Training Program | Admitting: Student in an Organized Health Care Education/Training Program

## 2023-02-05 DIAGNOSIS — C61 Malignant neoplasm of prostate: Secondary | ICD-10-CM | POA: Insufficient documentation

## 2023-02-05 MED ORDER — FLOTUFOLASTAT F 18 GALLIUM 296-5846 MBQ/ML IV SOLN
7.3150 | Freq: Once | INTRAVENOUS | Status: AC
  Administered 2023-02-05: 7.315 via INTRAVENOUS

## 2023-02-08 ENCOUNTER — Telehealth: Payer: Self-pay | Admitting: Gastroenterology

## 2023-02-08 NOTE — Telephone Encounter (Signed)
Provider sent a note to follow up with patient after recent hospital discharge.  He is covered by [employer] and I called [facility] and was told that he would need to get a referral from his PCP in order to be seen in the office.

## 2023-02-11 ENCOUNTER — Telehealth: Payer: Self-pay

## 2023-02-11 NOTE — Telephone Encounter (Signed)
This RN called to verify if pt was receiving oncology care through the [facility] or if he was still planning on receiving his oncology care through [facility] with Dr. Leonides Schanz- pt's spouse states that he is receiving oncology care through the [facility] and will not be coming to his appt tomorrow.   Appts canceled.

## 2023-02-11 NOTE — Telephone Encounter (Signed)
Debby Bud, RN from oncologist in [place] called and states that patient is scheduled to have a biopsy cystoscopy 03/11/2023. Debby Bud states that patient is in a lot of pain and need to know if the biopsy can be moved up. Debby Bud states that moving the biopsy can help with treatment plan for the patient. Debby Bud is made aware Dr. Ronne Binning is out of the office for a few days and it maybe Monday before there a response on recommendation. Debby Bud voiced understanding

## 2023-02-12 ENCOUNTER — Inpatient Hospital Stay: Payer: No Typology Code available for payment source | Admitting: Hematology and Oncology

## 2023-02-12 ENCOUNTER — Inpatient Hospital Stay: Payer: No Typology Code available for payment source

## 2023-02-24 ENCOUNTER — Emergency Department (HOSPITAL_COMMUNITY): Payer: No Typology Code available for payment source

## 2023-02-24 ENCOUNTER — Inpatient Hospital Stay (HOSPITAL_COMMUNITY)
Admission: EM | Admit: 2023-02-24 | Discharge: 2023-02-26 | DRG: 291 | Disposition: A | Discharge status: Home Health | Payer: No Typology Code available for payment source | Attending: Internal Medicine | Admitting: Internal Medicine

## 2023-02-24 ENCOUNTER — Other Ambulatory Visit: Payer: Self-pay

## 2023-02-24 ENCOUNTER — Encounter (HOSPITAL_COMMUNITY): Payer: Self-pay

## 2023-02-24 DIAGNOSIS — F4310 Post-traumatic stress disorder, unspecified: Secondary | ICD-10-CM | POA: Diagnosis present

## 2023-02-24 DIAGNOSIS — N1832 Chronic kidney disease, stage 3b: Secondary | ICD-10-CM | POA: Diagnosis present

## 2023-02-24 DIAGNOSIS — I130 Hypertensive heart and chronic kidney disease with heart failure and stage 1 through stage 4 chronic kidney disease, or unspecified chronic kidney disease: Principal | ICD-10-CM | POA: Diagnosis present

## 2023-02-24 DIAGNOSIS — E66811 Obesity, class 1: Secondary | ICD-10-CM | POA: Diagnosis present

## 2023-02-24 DIAGNOSIS — Z8546 Personal history of malignant neoplasm of prostate: Secondary | ICD-10-CM

## 2023-02-24 DIAGNOSIS — G4730 Sleep apnea, unspecified: Secondary | ICD-10-CM | POA: Diagnosis present

## 2023-02-24 DIAGNOSIS — C7951 Secondary malignant neoplasm of bone: Secondary | ICD-10-CM | POA: Diagnosis present

## 2023-02-24 DIAGNOSIS — D638 Anemia in other chronic diseases classified elsewhere: Secondary | ICD-10-CM | POA: Diagnosis present

## 2023-02-24 DIAGNOSIS — I10 Essential (primary) hypertension: Secondary | ICD-10-CM

## 2023-02-24 DIAGNOSIS — Z7901 Long term (current) use of anticoagulants: Secondary | ICD-10-CM

## 2023-02-24 DIAGNOSIS — E440 Moderate protein-calorie malnutrition: Secondary | ICD-10-CM | POA: Diagnosis present

## 2023-02-24 DIAGNOSIS — E871 Hypo-osmolality and hyponatremia: Secondary | ICD-10-CM | POA: Diagnosis not present

## 2023-02-24 DIAGNOSIS — Z87891 Personal history of nicotine dependence: Secondary | ICD-10-CM

## 2023-02-24 DIAGNOSIS — Z95810 Presence of automatic (implantable) cardiac defibrillator: Secondary | ICD-10-CM

## 2023-02-24 DIAGNOSIS — R0902 Hypoxemia: Secondary | ICD-10-CM | POA: Diagnosis present

## 2023-02-24 DIAGNOSIS — Z79899 Other long term (current) drug therapy: Secondary | ICD-10-CM

## 2023-02-24 DIAGNOSIS — C61 Malignant neoplasm of prostate: Secondary | ICD-10-CM

## 2023-02-24 DIAGNOSIS — K219 Gastro-esophageal reflux disease without esophagitis: Secondary | ICD-10-CM | POA: Diagnosis present

## 2023-02-24 DIAGNOSIS — K2100 Gastro-esophageal reflux disease with esophagitis, without bleeding: Secondary | ICD-10-CM | POA: Diagnosis present

## 2023-02-24 DIAGNOSIS — I5033 Acute on chronic diastolic (congestive) heart failure: Secondary | ICD-10-CM | POA: Diagnosis present

## 2023-02-24 DIAGNOSIS — Z8673 Personal history of transient ischemic attack (TIA), and cerebral infarction without residual deficits: Secondary | ICD-10-CM

## 2023-02-24 DIAGNOSIS — I4891 Unspecified atrial fibrillation: Secondary | ICD-10-CM | POA: Diagnosis present

## 2023-02-24 DIAGNOSIS — E785 Hyperlipidemia, unspecified: Secondary | ICD-10-CM | POA: Diagnosis present

## 2023-02-24 DIAGNOSIS — I509 Heart failure, unspecified: Principal | ICD-10-CM

## 2023-02-24 DIAGNOSIS — G894 Chronic pain syndrome: Secondary | ICD-10-CM | POA: Diagnosis present

## 2023-02-24 DIAGNOSIS — Z6833 Body mass index (BMI) 33.0-33.9, adult: Secondary | ICD-10-CM

## 2023-02-24 DIAGNOSIS — R7401 Elevation of levels of liver transaminase levels: Secondary | ICD-10-CM

## 2023-02-24 LAB — CBC
HCT: 27.8 % — ABNORMAL LOW (ref 39.0–52.0)
Hemoglobin: 8.8 g/dL — ABNORMAL LOW (ref 13.0–17.0)
MCH: 30.9 pg (ref 26.0–34.0)
MCHC: 31.7 g/dL (ref 30.0–36.0)
MCV: 97.5 fL (ref 80.0–100.0)
Platelets: 348 10*3/uL (ref 150–400)
RBC: 2.85 MIL/uL — ABNORMAL LOW (ref 4.22–5.81)
RDW: 17.7 % — ABNORMAL HIGH (ref 11.5–15.5)
WBC: 6.7 10*3/uL (ref 4.0–10.5)
nRBC: 1.6 % — ABNORMAL HIGH (ref 0.0–0.2)

## 2023-02-24 LAB — BASIC METABOLIC PANEL
Anion gap: 13 (ref 5–15)
BUN: 63 mg/dL — ABNORMAL HIGH (ref 8–23)
CO2: 26 mmol/L (ref 22–32)
Calcium: 8.6 mg/dL — ABNORMAL LOW (ref 8.9–10.3)
Chloride: 91 mmol/L — ABNORMAL LOW (ref 98–111)
Creatinine, Ser: 1.81 mg/dL — ABNORMAL HIGH (ref 0.61–1.24)
GFR, Estimated: 39 mL/min — ABNORMAL LOW (ref >60)
Glucose, Bld: 101 mg/dL — ABNORMAL HIGH (ref 70–99)
Potassium: 4.5 mmol/L (ref 3.5–5.1)
Sodium: 130 mmol/L — ABNORMAL LOW (ref 135–145)

## 2023-02-24 LAB — TSH: TSH: 2.04 u[IU]/mL (ref 0.350–4.500)

## 2023-02-24 LAB — TROPONIN I (HIGH SENSITIVITY)
Troponin I (High Sensitivity): 61 ng/L — ABNORMAL HIGH (ref <18)
Troponin I (High Sensitivity): 59 ng/L — ABNORMAL HIGH (ref <18)

## 2023-02-24 LAB — MAGNESIUM: Magnesium: 2.3 mg/dL (ref 1.7–2.4)

## 2023-02-24 LAB — PHOSPHORUS: Phosphorus: 3.6 mg/dL (ref 2.5–4.6)

## 2023-02-24 LAB — BRAIN NATRIURETIC PEPTIDE: B Natriuretic Peptide: 1095 pg/mL — ABNORMAL HIGH (ref 0.0–100.0)

## 2023-02-24 MED ORDER — FUROSEMIDE 10 MG/ML IJ SOLN
40.0000 mg | Freq: Two times a day (BID) | INTRAMUSCULAR | Status: DC
  Administered 2023-02-25 – 2023-02-26 (×3): 40 mg via INTRAVENOUS
  Filled 2023-02-24 (×3): qty 4

## 2023-02-24 MED ORDER — OXYCODONE HCL ER 10 MG PO T12A
10.0000 mg | EXTENDED_RELEASE_TABLET | Freq: Two times a day (BID) | ORAL | Status: DC
  Administered 2023-02-24 – 2023-02-26 (×4): 10 mg via ORAL
  Filled 2023-02-24 (×4): qty 1

## 2023-02-24 MED ORDER — PANTOPRAZOLE SODIUM 40 MG PO TBEC
40.0000 mg | DELAYED_RELEASE_TABLET | Freq: Every day | ORAL | Status: DC
  Administered 2023-02-25 – 2023-02-26 (×2): 40 mg via ORAL
  Filled 2023-02-24 (×2): qty 1

## 2023-02-24 MED ORDER — ONDANSETRON HCL 4 MG PO TABS: 4.0000 mg | ORAL_TABLET | Freq: Four times a day (QID) | ORAL | Status: DC | PRN

## 2023-02-24 MED ORDER — ACETAMINOPHEN 650 MG RE SUPP: 650.0000 mg | Freq: Four times a day (QID) | RECTAL | Status: DC | PRN

## 2023-02-24 MED ORDER — FLUOXETINE HCL 20 MG PO CAPS
80.0000 mg | ORAL_CAPSULE | Freq: Every day | ORAL | Status: DC
  Administered 2023-02-25 – 2023-02-26 (×2): 80 mg via ORAL
  Filled 2023-02-24 (×3): qty 4

## 2023-02-24 MED ORDER — ENOXAPARIN SODIUM 40 MG/0.4ML IJ SOSY: 40.0000 mg | PREFILLED_SYRINGE | INTRAMUSCULAR | Status: DC

## 2023-02-24 MED ORDER — TIMOLOL MALEATE 0.5 % OP SOLN
1.0000 [drp] | Freq: Every day | OPHTHALMIC | Status: DC
  Administered 2023-02-24 – 2023-02-25 (×2): 1 [drp] via OPHTHALMIC
  Filled 2023-02-24: qty 5

## 2023-02-24 MED ORDER — ACETAMINOPHEN 325 MG PO TABS
650.0000 mg | ORAL_TABLET | Freq: Four times a day (QID) | ORAL | Status: DC | PRN
  Administered 2023-02-25: 650 mg via ORAL
  Filled 2023-02-24 (×2): qty 2

## 2023-02-24 MED ORDER — LORATADINE 10 MG PO TABS
10.0000 mg | ORAL_TABLET | Freq: Every day | ORAL | Status: DC
  Administered 2023-02-25 – 2023-02-26 (×2): 10 mg via ORAL
  Filled 2023-02-24 (×2): qty 1

## 2023-02-24 MED ORDER — BISOPROLOL FUMARATE 5 MG PO TABS
10.0000 mg | ORAL_TABLET | Freq: Every day | ORAL | Status: DC
  Administered 2023-02-24 – 2023-02-26 (×3): 10 mg via ORAL
  Filled 2023-02-24 (×3): qty 2

## 2023-02-24 MED ORDER — POTASSIUM CHLORIDE CRYS ER 10 MEQ PO TBCR
10.0000 meq | EXTENDED_RELEASE_TABLET | ORAL | Status: DC
  Administered 2023-02-25: 10 meq via ORAL
  Filled 2023-02-24: qty 1

## 2023-02-24 MED ORDER — LATANOPROST 0.005 % OP SOLN
1.0000 [drp] | Freq: Every day | OPHTHALMIC | Status: DC
  Administered 2023-02-24 – 2023-02-25 (×2): 1 [drp] via OPHTHALMIC
  Filled 2023-02-24: qty 2.5

## 2023-02-24 MED ORDER — ALBUTEROL SULFATE (2.5 MG/3ML) 0.083% IN NEBU: 3.0000 mL | INHALATION_SOLUTION | RESPIRATORY_TRACT | Status: DC | PRN

## 2023-02-24 MED ORDER — POLYETHYLENE GLYCOL 3350 17 G PO PACK
17.0000 g | PACK | Freq: Two times a day (BID) | ORAL | Status: DC
  Administered 2023-02-24 – 2023-02-26 (×4): 17 g via ORAL
  Filled 2023-02-24 (×4): qty 1

## 2023-02-24 MED ORDER — KETOTIFEN FUMARATE 0.035 % OP SOLN: 1.0000 [drp] | Freq: Two times a day (BID) | OPHTHALMIC | Status: DC | PRN

## 2023-02-24 MED ORDER — ONDANSETRON HCL 4 MG/2ML IJ SOLN: 4.0000 mg | Freq: Four times a day (QID) | INTRAMUSCULAR | Status: DC | PRN

## 2023-02-24 MED ORDER — TRAZODONE HCL 50 MG PO TABS
50.0000 mg | ORAL_TABLET | Freq: Every evening | ORAL | Status: DC | PRN
  Administered 2023-02-24 – 2023-02-25 (×2): 50 mg via ORAL
  Filled 2023-02-24 (×3): qty 1

## 2023-02-24 MED ORDER — APIXABAN 5 MG PO TABS
5.0000 mg | ORAL_TABLET | Freq: Two times a day (BID) | ORAL | Status: DC
  Administered 2023-02-24 – 2023-02-26 (×4): 5 mg via ORAL
  Filled 2023-02-24 (×4): qty 1

## 2023-02-24 MED ORDER — FUROSEMIDE 10 MG/ML IJ SOLN
40.0000 mg | Freq: Once | INTRAMUSCULAR | Status: AC
  Administered 2023-02-24: 40 mg via INTRAVENOUS
  Filled 2023-02-24: qty 4

## 2023-02-24 NOTE — ED Notes (Signed)
EKG did not transfer over from triage. System glitching. Handed to MD.

## 2023-02-24 NOTE — ED Provider Triage Note (Signed)
Emergency Medicine Provider Triage Evaluation Note  Luis Donovan , a 77 y.o. male  was evaluated in triage.  Pt complains of SOB worsening over the past few months. HR this AM elevated at 165 which caused patient to report to the ED. Hx A fib.   Review of Systems  Positive: SOB Negative: fever  Physical Exam  BP 128/75 (BP Location: Right Arm)   Pulse 73   Temp 98.9 F (37.2 C) (Oral)   Resp 18   Wt 98 kg   SpO2 99%   BMI 33.83 kg/m  Gen:   Awake, no distress   Resp:  Normal effort  MSK:   Moves extremities without difficulty  Other:    Medical Decision Making  Medically screening exam initiated at 12:19 PM.  Appropriate orders placed.  Luis Donovan was informed that the remainder of the evaluation will be completed by another provider, this initial triage assessment does not replace that evaluation, and the importance of remaining in the ED until their evaluation is complete.  Labs CXR EKG HR 73 in triage   Mannie Stabile, PA-C 02/24/23 1220

## 2023-02-24 NOTE — Plan of Care (Signed)
  Problem: Clinical Measurements: Goal: Ability to maintain clinical measurements within normal limits will improve Outcome: Progressing Goal: Respiratory complications will improve Outcome: Progressing Goal: Cardiovascular complication will be avoided Outcome: Progressing   Problem: Clinical Measurements: Goal: Respiratory complications will improve Outcome: Progressing   Problem: Clinical Measurements: Goal: Cardiovascular complication will be avoided Outcome: Progressing   Problem: Safety: Goal: Ability to remain free from injury will improve Outcome: Progressing   Problem: Skin Integrity: Goal: Risk for impaired skin integrity will decrease Outcome: Progressing   Problem: Activity: Goal: Capacity to carry out activities will improve Outcome: Progressing

## 2023-02-24 NOTE — ED Triage Notes (Addendum)
C/o sob, fatigue, and dizziness with activity  Wife reports checking bp this morning and on bp machine HR was 165.  Patient denies cp.  Hx a-fib and defibrillator noted in triage

## 2023-02-24 NOTE — H&P (Signed)
History and Physical    Patient: Luis Donovan DOB: 01-09-1946 DOA: 02/24/2023 DOS: the patient was seen and examined on 02/24/2023 PCP: [facility]  Patient coming from: Home  Chief Complaint:  Chief Complaint  Patient presents with   Shortness of Breath   HPI: Luis Donovan is a 77 y.o. male with medical history significant of prostate cancer with bone metastasis, chronic pain, GERD, hypertension, hyperlipidemia, class I obesity, PTSD, chronic kidney disease stage IIIb and chronic diastolic heart failure; who presented to the hospital secondary to increased shortness of breath, dyspnea on exertion, orthopnea and noticing over 12 pounds weight gain.  Patient reports symptoms have been present over the last 10 days and worsening.  He was seen by PCP at the [facility] and expressed changes in his medications were made but he noticed no relief or improvement in his condition. Patient is afebrile, denies chest pain, no nausea, no vomiting, no sick contacts, expressed no dysuria, no hematuria, no melena, no hematochezia, no focal weaknesses or any other complaints.  In the ED positive signs of fluid overload and lower extremity swelling appreciated; short winded and unable to speak in full sentences at time of evaluation by EDP.  Patient was slightly hypoxic.  EKG without acute ischemic changes.  Elevated BNP and central vascular congestion on chest x-ray appreciated.  Patient received IV Lasix expressing increase in his urine output and feeling less short of breath.  TRH consulted to place patient in the hospital for further evaluation and manage  Review of Systems: As mentioned in the history of present illness. All other systems reviewed and are negative. Past Medical History:  Diagnosis Date   Anemia    CVA (cerebral vascular accident) (HCC) 07/17/2013   Empyema (HCC)    Erectile dysfunction due to arterial insufficiency    Essential (primary) hypertension     Gastro-esophageal reflux disease with esophagitis    Head injury    Heart disease    Hyperlipidemia, unspecified    Hypertension    Obesity, unspecified    Pneumonia    Post-traumatic stress disorder, unspecified    Sleep apnea, unspecified    Past Surgical History:  Procedure Laterality Date   CARDIAC DEFIBRILLATOR PLACEMENT     LUNG SURGERY     operation on skin of neck     ROTATOR CUFF REPAIR Left    TENDON REPAIR     VIDEO ASSISTED THORACOSCOPY     Social History:  reports that he has quit smoking. He has never used smokeless tobacco. He reports that he does not drink alcohol and does not use drugs.  No Known Allergies  History reviewed. No pertinent family history.  Prior to Admission medications   Medication Sig Start Date End Date Taking? Authorizing Provider  acetaminophen (TYLENOL) 325 MG tablet Take 2 tablets (650 mg total) by mouth every 6 (six) hours as needed for mild pain (or Fever >/= 101). 01/01/23   Alwyn Ren, MD  albuterol (VENTOLIN HFA) 108 (90 Base) MCG/ACT inhaler Inhale 2 puffs into the lungs every 4 (four) hours as needed for wheezing or shortness of breath. 07/16/22   [provider]  apixaban (ELIQUIS) 5 MG TABS tablet Take 5 mg by mouth 2 (two) times daily.    [provider]  bisacodyl (DULCOLAX) 5 MG EC tablet Take 10 mg by mouth daily as needed for moderate constipation. 02/22/23   [provider]  bisoprolol (ZEBETA) 10 MG tablet Take 1 tablet (10  mg total) by mouth daily. 01/28/23   Johnson, Clanford L, MD  Carboxymethylcellulose Sod PF 0.5 % SOLN Place 1 drop into both eyes 2 (two) times daily. For dry eyes    [provider]  cetirizine (ZYRTEC) 10 MG tablet Take 10 mg by mouth at bedtime.    [provider]  Cholecalciferol 50 MCG (2000 UT) TABS Take 1 tablet by mouth every morning. 07/16/22   [provider]  clotrimazole (LOTRIMIN) 1 % cream Apply 1 Application topically 2 (two) times  daily. Apply a small amount to affected area twice daily for feet.    [provider]  cyclobenzaprine (FLEXERIL) 5 MG tablet Take 1 tablet (5 mg total) by mouth 3 (three) times daily as needed for muscle spasms. 01/13/23   Pickenpack-Cousar, Arty Baumgartner, NP  diclofenac Sodium (VOLTAREN) 1 % GEL Apply 2 g topically 4 (four) times daily as needed (pain). 07/16/22   [provider]  FLUoxetine (PROZAC) 20 MG capsule Take 80 mg by mouth daily. 03/03/22   [provider]  KETOTIFEN FUMARATE OP Apply 1 drop to eye 2 (two) times daily as needed (allergies). 0.025%    [provider]  latanoprost (XALATAN) 0.005 % ophthalmic solution Place 1 drop into both eyes at bedtime.    [provider]  lidocaine (LIDODERM) 5 % Place 1 patch onto the skin daily. Apply 1 patch to skin once daily (Apply for  hours, then remove for  hours) 07/16/22   [provider]  losartan (COZAAR) 100 MG tablet Take 50 mg by mouth daily. 01/04/23   [provider]  mirtazapine (REMERON) 15 MG tablet Take 15 mg by mouth at bedtime. For loss of appetite 02/22/23   [provider]  morphine (MS CONTIN) 15 MG 12 hr tablet Take 15 mg by mouth every 12 (twelve) hours. For cancer pain. 02/10/23   [provider]  omeprazole (PRILOSEC) 20 MG capsule Take 20 mg by mouth 2 (two) times daily before a meal. Take on an empty stomach 30 minutes prior to a meal    [provider]  ondansetron (ZOFRAN) 8 MG tablet Take 1 tablet (8 mg total) by mouth every 8 (eight) hours as needed for nausea or vomiting. 01/14/23   Pickenpack-Cousar, Arty Baumgartner, NP  oxyCODONE ER (XTAMPZA ER) 9 MG C12A Take 9 mg by mouth every 12 (twelve) hours. 01/14/23   Pickenpack-Cousar, Arty Baumgartner, NP  Oxycodone HCl 10 MG TABS Take 10 mg by mouth every 4 (four) hours as needed (severe pain). 02/16/23   [provider]  polyethylene glycol (MIRALAX / GLYCOLAX) 17 g packet Take 17 g by mouth 2 (two)  times daily. 01/13/23   Pickenpack-Cousar, Arty Baumgartner, NP  potassium chloride (KLOR-CON M) 10 MEQ tablet Take 1 tablet (10 mEq total) by mouth every other day. 01/28/23   Cleora Fleet, MD  Skin Protectants, Misc. (EUCERIN) cream Apply 1 Application topically 2 (two) times daily. Apply a small amount to affected area twice daily    [provider]  timolol (TIMOPTIC-XR) 0.5 % ophthalmic gel-forming Apply 1 drop to eye daily. 08/13/22   [provider]  torsemide (DEMADEX) 20 MG tablet Take 20 mg by mouth 2 (two) times daily. 01/04/23   [provider]  torsemide 40 MG TABS Take 40 mg by mouth daily. Take 1 additional dose in the afternoon as needed for weight gain of 1-2 lbs in one day or 5 lbs in one week 01/28/23  Johnson, Clanford L, MD  traZODone (DESYREL) 50 MG tablet Take 50 mg by mouth at bedtime. 02/16/13   [provider]    Physical Exam: Vitals:   02/24/23 1457 02/24/23 1500 02/24/23 1515 02/24/23 1606  BP:  (!) 145/82  (!) 155/97  Pulse: 68 69  73  Resp: 14 12  19   Temp:    98.1 F (36.7 C)  TempSrc:      SpO2: 99%  95% 98%  Weight:    96.3 kg  Height:    5\' 7"  (1.702 m)   General exam: Alert, awake, oriented x 3; no chest pain, short winded with minimal exertion.  At time of my examination no requiring oxygen supplementation.  No chest pain. Respiratory system: Decreased breath sounds at the bases; positive fine crackles.  No wheezing Cardiovascular system: Rate controlled, no rubs, no gallops, no JVD. Gastrointestinal system: Abdomen is obese, nondistended, soft and nontender. No organomegaly or masses felt. Normal bowel sounds heard. Central nervous system: Alert and oriented. No focal neurological deficits. Extremities: No cyanosis or clubbing. Skin: No petechiae. Psychiatry: Judgement and insight appear normal. Mood & affect appropriate.   Data Reviewed: Recent 2D echo 01/21/2023: Demonstrating preserved ejection fraction (50-55%), no  regional wall motion abnormalities, mild left ventricular hypertrophy has been appreciated.  Diastolic parameters indeterminate. No significant valvular disorder CBC: White blood cell 7.0, hemoglobin 8.1 and platelet counts 232K. Comprehensive metabolic panel: GFR 35, Alkaline phosphatase 616, ALT 296, AST 106, albumin 3.4, creatinine 1.96, BUN 62, bicarb 24, chloride 88, potassium 3.9 and sodium level 123    Assessment and Plan: 1-acute on chronic diastolic heart failure -Daily weights, strict I's and O's and IV diuresis -Follow clinical response and further adjust diuretics regimen for outpatient therapy -Appropriate diet and adequate hydration discussed with patient and wife at bedside. -No oxygen requirement at the moment.  2-chronic renal failure -Patient with underlying chronic kidney disease stage IIIb -Currently stable and at baseline -He might ended experiencing increase in his creatinine with ongoing diuresis -Continue minimizing nephrotoxic agents, avoid the use of contrast and prevent hypotension. -Continue to follow renal function trend.  4-history of metastatic prostate cancer -[facility]  5-gastroesophageal reflux disease -Continue PPI.  6-chronic pain -In the setting of chronic bone metastatic disease -Continue current analgesic therapy. -follow clinical response  7-class I obesity -Body mass index is 33.25 kg/m. -Low-calorie diet, portion control and increase physical activity discussed with patient.    Advance Care Planning:   Code Status: Full Code   Consults: None  Family Communication: Wife at bedside  Severity of Illness: The appropriate patient status for this patient is OBSERVATION. Observation status is judged to be reasonable and necessary in order to provide the required intensity of service to ensure the patient's safety. The patient's presenting symptoms, physical exam findings, and initial  radiographic and laboratory data in the context of their medical condition is felt to place them at decreased risk for further clinical deterioration. Furthermore, it is anticipated that the patient will be medically stable for discharge from the hospital within 2 midnights of admission.   Author: Vassie Loll, MD 02/24/2023 5:09 PM  For on call review [email or web address].

## 2023-02-24 NOTE — ED Provider Notes (Signed)
[place] EMERGENCY DEPARTMENT AT [facility] Provider Note   CSN: [id number] Arrival date & time: 02/24/23  1128     History  Chief Complaint  Patient presents with   Shortness of Breath    Luis Donovan is a 77 y.o. male.  Patient with history of metastatic pancreatic adenocarcinoma with bone mets, afib on Elliquis, CKD, MI, hypertension, hyperlipidemia presents today with complaints of shortness of breath. He states that he has been short of breath for the past few months and has been admitted for same at the beginning of the month. Notes he felt better when he went home but has progressively worsened since. He has been taking his torsemide as prescribed and states that he is up 12 lbs from his discharge weight. He notes that he is now unable to even have a conversation or walk to the bathroom without stopping to catch his breath. He notes associated PND and orthopnea and notes that he has had to sleep with his head more elevated than normal. Denies any chest pain. Notes that both his legs and his abdomen are more swollen than normal. Denies any pain in his legs. His wife checked his blood pressure this morning and noted that the heartrate on the machine was 165. Presents with concern for same. Patient with normal heart rate here today.   The history is provided by the patient. No language interpreter was used.  Shortness of Breath      Home Medications Prior to Admission medications   Medication Sig Start Date End Date Taking? Authorizing Provider  acetaminophen (TYLENOL) 325 MG tablet Take 2 tablets (650 mg total) by mouth every 6 (six) hours as needed for mild pain (or Fever >/= 101). 01/01/23   Alwyn Ren, MD  albuterol (VENTOLIN HFA) 108 (90 Base) MCG/ACT inhaler Inhale 2 puffs into the lungs every 4 (four) hours as needed for wheezing or shortness of breath. 07/16/22   [provider]  apixaban (ELIQUIS) 5 MG TABS tablet Take 5 mg by mouth 2 (two)  times daily.    [provider]  bisoprolol (ZEBETA) 10 MG tablet Take 1 tablet (10 mg total) by mouth daily. 01/28/23   Johnson, Clanford L, MD  Carboxymethylcellulose Sod PF 0.5 % SOLN Place 1 drop into both eyes 2 (two) times daily. For dry eyes    [provider]  cetirizine (ZYRTEC) 10 MG tablet Take 10 mg by mouth at bedtime.    [provider]  Cholecalciferol 50 MCG (2000 UT) TABS Take 1 tablet by mouth every morning. 07/16/22   [provider]  clotrimazole (LOTRIMIN) 1 % cream Apply 1 Application topically 2 (two) times daily. Apply a small amount to affected area twice daily for feet.    [provider]  cyclobenzaprine (FLEXERIL) 5 MG tablet Take 1 tablet (5 mg total) by mouth 3 (three) times daily as needed for muscle spasms. 01/13/23   Pickenpack-Cousar, Arty Baumgartner, NP  diclofenac Sodium (VOLTAREN) 1 % GEL Apply 2 g topically 4 (four) times daily as needed (pain). 07/16/22   [provider]  FLUoxetine (PROZAC) 20 MG capsule Take 80 mg by mouth daily. 03/03/22   [provider]  KETOTIFEN FUMARATE OP Apply 1 drop to eye 2 (two) times daily as needed (allergies). 0.025%    [provider]  latanoprost (XALATAN) 0.005 % ophthalmic solution Place 1 drop into both eyes at bedtime.    [provider]  lidocaine (LIDODERM) 5 %  Place 1 patch onto the skin daily. Apply 1 patch to skin once daily (Apply for  hours, then remove for  hours) 07/16/22   [provider]  omeprazole (PRILOSEC) 20 MG capsule Take 20 mg by mouth 2 (two) times daily before a meal. Take on an empty stomach 30 minutes prior to a meal    [provider]  ondansetron (ZOFRAN) 8 MG tablet Take 1 tablet (8 mg total) by mouth every 8 (eight) hours as needed for nausea or vomiting. 01/14/23   Pickenpack-Cousar, Arty Baumgartner, NP  oxyCODONE ER (XTAMPZA ER) 9 MG C12A Take 9 mg by mouth every 12 (twelve) hours. 01/14/23   Pickenpack-Cousar, Arty Baumgartner,  NP  polyethylene glycol (MIRALAX / GLYCOLAX) 17 g packet Take 17 g by mouth 2 (two) times daily. 01/13/23   Pickenpack-Cousar, Arty Baumgartner, NP  potassium chloride (KLOR-CON M) 10 MEQ tablet Take 1 tablet (10 mEq total) by mouth every other day. 01/28/23   Cleora Fleet, MD  Skin Protectants, Misc. (EUCERIN) cream Apply 1 Application topically 2 (two) times daily. Apply a small amount to affected area twice daily    [provider]  timolol (TIMOPTIC-XR) 0.5 % ophthalmic gel-forming Apply 1 drop to eye daily. 08/13/22   [provider]  torsemide 40 MG TABS Take 40 mg by mouth daily. Take 1 additional dose in the afternoon as needed for weight gain of 1-2 lbs in one day or 5 lbs in one week 01/28/23   Cleora Fleet, MD  traZODone (DESYREL) 50 MG tablet Take 50 mg by mouth at bedtime. 02/16/13   [provider]      Allergies    Patient has no known allergies.    Review of Systems   Review of Systems  Respiratory:  Positive for shortness of breath.   Cardiovascular:  Positive for leg swelling.  All other systems reviewed and are negative.   Physical Exam Updated Vital Signs BP 128/75 (BP Location: Right Arm)   Pulse 73   Temp 98.9 F (37.2 C) (Oral)   Resp 18   Wt 98 kg   SpO2 99%   BMI 33.83 kg/m  Physical Exam Vitals and nursing note reviewed.  Constitutional:      General: He is not in acute distress.    Appearance: Normal appearance. He is normal weight. He is not ill-appearing, toxic-appearing or diaphoretic.  HENT:     Head: Normocephalic and atraumatic.  Cardiovascular:     Rate and Rhythm: Normal rate and regular rhythm.     Heart sounds: Normal heart sounds.  Pulmonary:     Effort: Pulmonary effort is normal. No respiratory distress.     Breath sounds: Normal breath sounds.  Abdominal:     Palpations: Abdomen is soft.  Musculoskeletal:        General: Normal range of motion.     Cervical back: Normal range of motion.     Comments:  2+ pitting edema noted to bilateral lower extremities.   Skin:    General: Skin is warm and dry.  Neurological:     General: No focal deficit present.     Mental Status: He is alert.  Psychiatric:        Mood and Affect: Mood normal.        Behavior: Behavior normal.     ED Results / Procedures / Treatments   Labs (all labs ordered are listed, but only abnormal results are displayed) Labs Reviewed  BASIC METABOLIC  PANEL - Abnormal; Notable for the following components:      Result Value   Sodium 130 (*)    Chloride 91 (*)    Glucose, Bld 101 (*)    BUN 63 (*)    Creatinine, Ser 1.81 (*)    Calcium 8.6 (*)    GFR, Estimated 39 (*)    All other components within normal limits  CBC - Abnormal; Notable for the following components:   RBC 2.85 (*)    Hemoglobin 8.8 (*)    HCT 27.8 (*)    RDW 17.7 (*)    nRBC 1.6 (*)    All other components within normal limits  BRAIN NATRIURETIC PEPTIDE - Abnormal; Notable for the following components:   B Natriuretic Peptide 1,095.0 (*)    All other components within normal limits  TROPONIN I (HIGH SENSITIVITY) - Abnormal; Notable for the following components:   Troponin I (High Sensitivity) 61 (*)    All other components within normal limits  TROPONIN I (HIGH SENSITIVITY) - Abnormal; Notable for the following components:   Troponin I (High Sensitivity) 59 (*)    All other components within normal limits    EKG None  Radiology DG Chest 2 View  Result Date: 02/24/2023 CLINICAL DATA:  Shortness of breath.  History of prostate cancer EXAM: CHEST - 2 VIEW COMPARISON:  X-ray 01/17/2023 FINDINGS: Left upper chest battery pack for defibrillator with leads along the right side of the heart. Left shoulder reverse arthroplasty. Multifocal sclerotic bone lesions identified consistent with known metastatic disease. Stable cardiopericardial silhouette. No pneumothorax or edema. Tiny pleural effusions. Degenerative changes along the spine. IMPRESSION:  Defibrillator.  Tiny pleural effusion versus pleural thickening. Multifocal sclerotic bone lesions consistent with known history of metastatic prostate cancer. Electronically Signed   By: Karen Kays M.D.   On: 02/24/2023 13:35    Procedures Procedures    Medications Ordered in ED Medications  furosemide (LASIX) injection 40 mg (40 mg Intravenous Given 02/24/23 1446)    ED Course/ Medical Decision Making/ A&P                                 Medical Decision Making Amount and/or Complexity of Data Reviewed Labs: ordered. Radiology: ordered.   This patient is a 77 y.o. male who presents to the ED for concern of shortness of breath, this involves an extensive number of treatment options, and is a complaint that carries with it a high risk of complications and morbidity. The emergent differential diagnosis prior to evaluation includes, but is not limited to,  CHF, pericardial effusion/tamponade, arrhythmias, ACS, COPD, asthma, bronchitis, pneumonia, pneumothorax, PE, anemia    This is not an exhaustive differential.   Past Medical History / Co-morbidities / Social History:  has a past medical history of Anemia, CVA (cerebral vascular accident) (HCC) (07/17/2013), Empyema (HCC), Erectile dysfunction due to arterial insufficiency, Essential (primary) hypertension, Gastro-esophageal reflux disease with esophagitis, Head injury, Heart disease, Hyperlipidemia, unspecified, Hypertension, Obesity, unspecified, Pneumonia, Post-traumatic stress disorder, unspecified, and Sleep apnea, unspecified.  Additional history: Chart reviewed. Pertinent results include: patient recently admitted with CHF exacerbation improved on IV Lasix. Also had VQ scan that showed ' indeterminate evidence for PE' patient given heparin and Elliquis  Physical Exam: Physical exam performed. The pertinent findings include: Pitting edema in bilateral lower extremities. Patient desats into the 60s with conversation, becomes  dyspneic. When sitting up oxygen returns to 100%.  Lab Tests: I ordered, and personally interpreted labs.  The pertinent results include:  hgb 8.8 improved from previous, Na 130, chloride 91, creatinine 1.81 all improved from previous. BNP 1,095. Troponin 61 --> 59   Imaging Studies: I ordered imaging studies including CXR. I independently visualized and interpreted imaging which showed   Defibrillator.  Tiny pleural effusion versus pleural thickening.   Multifocal sclerotic bone lesions consistent with known history of metastatic prostate cancer.  I agree with the radiologist interpretation.   Cardiac Monitoring:  The patient was maintained on a cardiac monitor.  My attending physician Dr. Maple Hudson viewed and interpreted the cardiac monitored which showed an underlying rhythm of: no STEMI, rate 70s. I agree with this interpretation.   Medications: I ordered medication including Lasix  for diuresis. Reevaluation of the patient after these medicines showed that the patient stayed the same. I have reviewed the patients home medicines and have made adjustments as needed.   Disposition: After consideration of the diagnostic results and the patients response to treatment, I feel that patient will require admission for diuresis with CHF exacerbation and failure of outpatient diuretics. Discussed with patient who is understanding and in agreement.     I discussed this case with my attending physician Dr. Maple Hudson who cosigned this note including patient's presenting symptoms, physical exam, and planned diagnostics and interventions. Attending physician stated agreement with plan or made changes to plan which were implemented.    Final Clinical Impression(s) / ED Diagnoses Final diagnoses:  Acute on chronic congestive heart failure, unspecified heart failure type [facility])    Rx / DC Orders ED Discharge Orders     None         Silva Bandy, PA-C 02/24/23 1521    Coral Spikes,  DO 02/25/23 1745

## 2023-02-25 DIAGNOSIS — C7951 Secondary malignant neoplasm of bone: Secondary | ICD-10-CM | POA: Diagnosis present

## 2023-02-25 DIAGNOSIS — Z7901 Long term (current) use of anticoagulants: Secondary | ICD-10-CM | POA: Diagnosis not present

## 2023-02-25 DIAGNOSIS — E440 Moderate protein-calorie malnutrition: Secondary | ICD-10-CM | POA: Diagnosis present

## 2023-02-25 DIAGNOSIS — I4891 Unspecified atrial fibrillation: Secondary | ICD-10-CM | POA: Diagnosis present

## 2023-02-25 DIAGNOSIS — I5033 Acute on chronic diastolic (congestive) heart failure: Secondary | ICD-10-CM

## 2023-02-25 DIAGNOSIS — I509 Heart failure, unspecified: Secondary | ICD-10-CM

## 2023-02-25 DIAGNOSIS — K2100 Gastro-esophageal reflux disease with esophagitis, without bleeding: Secondary | ICD-10-CM | POA: Diagnosis present

## 2023-02-25 DIAGNOSIS — Z8546 Personal history of malignant neoplasm of prostate: Secondary | ICD-10-CM | POA: Diagnosis not present

## 2023-02-25 DIAGNOSIS — Z87891 Personal history of nicotine dependence: Secondary | ICD-10-CM | POA: Diagnosis not present

## 2023-02-25 DIAGNOSIS — E785 Hyperlipidemia, unspecified: Secondary | ICD-10-CM | POA: Diagnosis present

## 2023-02-25 DIAGNOSIS — G894 Chronic pain syndrome: Secondary | ICD-10-CM | POA: Diagnosis present

## 2023-02-25 DIAGNOSIS — F4310 Post-traumatic stress disorder, unspecified: Secondary | ICD-10-CM | POA: Diagnosis present

## 2023-02-25 DIAGNOSIS — Z79899 Other long term (current) drug therapy: Secondary | ICD-10-CM | POA: Diagnosis not present

## 2023-02-25 DIAGNOSIS — R0902 Hypoxemia: Secondary | ICD-10-CM | POA: Diagnosis present

## 2023-02-25 DIAGNOSIS — Z95810 Presence of automatic (implantable) cardiac defibrillator: Secondary | ICD-10-CM | POA: Diagnosis not present

## 2023-02-25 DIAGNOSIS — I130 Hypertensive heart and chronic kidney disease with heart failure and stage 1 through stage 4 chronic kidney disease, or unspecified chronic kidney disease: Secondary | ICD-10-CM | POA: Diagnosis present

## 2023-02-25 DIAGNOSIS — Z6833 Body mass index (BMI) 33.0-33.9, adult: Secondary | ICD-10-CM | POA: Diagnosis not present

## 2023-02-25 DIAGNOSIS — E66811 Obesity, class 1: Secondary | ICD-10-CM | POA: Diagnosis present

## 2023-02-25 DIAGNOSIS — D638 Anemia in other chronic diseases classified elsewhere: Secondary | ICD-10-CM | POA: Diagnosis present

## 2023-02-25 DIAGNOSIS — N1832 Chronic kidney disease, stage 3b: Secondary | ICD-10-CM | POA: Diagnosis present

## 2023-02-25 DIAGNOSIS — G4730 Sleep apnea, unspecified: Secondary | ICD-10-CM | POA: Diagnosis present

## 2023-02-25 DIAGNOSIS — E871 Hypo-osmolality and hyponatremia: Secondary | ICD-10-CM | POA: Diagnosis not present

## 2023-02-25 DIAGNOSIS — K219 Gastro-esophageal reflux disease without esophagitis: Secondary | ICD-10-CM | POA: Diagnosis present

## 2023-02-25 DIAGNOSIS — Z8673 Personal history of transient ischemic attack (TIA), and cerebral infarction without residual deficits: Secondary | ICD-10-CM | POA: Diagnosis not present

## 2023-02-25 LAB — CBC
HCT: 27.9 % — ABNORMAL LOW (ref 39.0–52.0)
Hemoglobin: 8.5 g/dL — ABNORMAL LOW (ref 13.0–17.0)
MCH: 29.9 pg (ref 26.0–34.0)
MCHC: 30.5 g/dL (ref 30.0–36.0)
MCV: 98.2 fL (ref 80.0–100.0)
Platelets: 375 10*3/uL (ref 150–400)
RBC: 2.84 MIL/uL — ABNORMAL LOW (ref 4.22–5.81)
RDW: 17.8 % — ABNORMAL HIGH (ref 11.5–15.5)
WBC: 6 10*3/uL (ref 4.0–10.5)
nRBC: 1.2 % — ABNORMAL HIGH (ref 0.0–0.2)

## 2023-02-25 LAB — BASIC METABOLIC PANEL
Anion gap: 10 (ref 5–15)
BUN: 54 mg/dL — ABNORMAL HIGH (ref 8–23)
CO2: 27 mmol/L (ref 22–32)
Calcium: 8.4 mg/dL — ABNORMAL LOW (ref 8.9–10.3)
Chloride: 95 mmol/L — ABNORMAL LOW (ref 98–111)
Creatinine, Ser: 1.52 mg/dL — ABNORMAL HIGH (ref 0.61–1.24)
GFR, Estimated: 48 mL/min — ABNORMAL LOW (ref >60)
Glucose, Bld: 86 mg/dL (ref 70–99)
Potassium: 3.7 mmol/L (ref 3.5–5.1)
Sodium: 132 mmol/L — ABNORMAL LOW (ref 135–145)

## 2023-02-25 MED ORDER — ENSURE ENLIVE PO LIQD
237.0000 mL | Freq: Two times a day (BID) | ORAL | Status: DC
  Administered 2023-02-25 – 2023-02-26 (×2): 237 mL via ORAL

## 2023-02-25 MED ORDER — ADULT MULTIVITAMIN W/MINERALS CH
1.0000 | ORAL_TABLET | Freq: Every day | ORAL | Status: DC
  Administered 2023-02-25 – 2023-02-26 (×2): 1 via ORAL
  Filled 2023-02-25 (×4): qty 1

## 2023-02-25 NOTE — Progress Notes (Signed)
Initial Nutrition Assessment  DOCUMENTATION CODES:   Non-severe (moderate) malnutrition in context of chronic illness  INTERVENTION:   Ensure Plus High Protein po BID, each supplement provides 350 kcal and 20 grams of protein. MVI with minerals daily. Low sodium, fluid restricted diet education provided.   NUTRITION DIAGNOSIS:   Moderate Malnutrition related to chronic illness (CHF) as evidenced by mild muscle depletion, mild fat depletion, energy intake < 75% for > or equal to 1 month.  GOAL:   Patient will meet greater than or equal to 90% of their needs  MONITOR:   PO intake, Supplement acceptance  REASON FOR ASSESSMENT:   Malnutrition Screening Tool    ASSESSMENT:   77 yo male admitted with acute on chronic diastolic heart failure with SOB and 12+ lb weight gain PTA. PMH includes HTN, CVA, HTN, GERD, esophagitis, HLD, PTSD, sleep apnea, anemia, heart disease, COVID+ 12/18/22.  Spoke with patient and his wife at bedside. Patient has had a poor appetite for the past month and has been eating less than half of his usual intake. He eats a small amount of cereal for breakfast, then mostly soups and broth for lunch and dinner meals. He was drinking Boost High Protein shakes at home PTA. He likes soup, but knows most soups are high in sodium. He endorses weight loss mostly from fluids. His appetite has improved since admission. He agreed to drink Ensure supplements BID to help maximize protein intake. Will also add a MVI.  Provided education on a low sodium diet with 2 L fluid restriction. Gave wife "Low Sodium Nutrition Therapy" handout from the Academy of [employer]etics.   Labs reviewed. Na 132  Medications reviewed and include Lasix, Protonix, Klor-Con, Miralax.  Weight history reviewed. Patient with 9% weight loss over the past 2 months. Unable to determine actual dry weight loss.   NUTRITION - FOCUSED PHYSICAL EXAM:  Flowsheet Row Most Recent Value  Orbital  Region Mild depletion  Upper Arm Region Mild depletion  Thoracic and Lumbar Region No depletion  Buccal Region No depletion  Temple Region No depletion  Clavicle Bone Region Mild depletion  Clavicle and Acromion Bone Region No depletion  Scapular Bone Region No depletion  Dorsal Hand Mild depletion  Patellar Region Mild depletion  Anterior Thigh Region Mild depletion  Posterior Calf Region Mild depletion  Edema (RD Assessment) Moderate  Hair Reviewed  Eyes Reviewed  Mouth Reviewed  Skin Reviewed  Nails Reviewed       Diet Order:   Diet Order             Diet 2 gram sodium Room service appropriate? Yes; Fluid consistency: Thin; Fluid restriction: 2000 mL Fluid  Diet effective now                   EDUCATION NEEDS:   Education needs have been addressed  Skin:  Skin Assessment: Reviewed RN Assessment  Last BM:  10/8  Height:   Ht Readings from Last 1 Encounters:  02/24/23 5\' 7"  (1.702 m)    Weight:   Wt Readings from Last 1 Encounters:  02/25/23 93.6 kg    Ideal Body Weight:  67.3 kg  BMI:  Body mass index is 32.31 kg/m.  Estimated Nutritional Needs:   Kcal:  1800-2000  Protein:  85-100 gm  Fluid:  1.8-2 L   Gabriel Rainwater RD, LDN, CNSC Please refer to Amion for contact information.

## 2023-02-25 NOTE — Progress Notes (Signed)
   02/25/23 1406  ReDS Vest / Clip  Station Marker D  Ruler Value 42  ReDS Value Range < 36  ReDS Actual Value 31

## 2023-02-25 NOTE — TOC Initial Note (Addendum)
Transition of Care [facility]) - Initial/Assessment Note    Patient Details  Name: Luis Donovan MRN: [id number] Date of Birth: [birth year]  Transition of Care [facility] CM/SW Contact:    Erin Sons, LCSW Phone Number: 02/25/2023, 12:09 PM  Clinical Narrative:                  CSW met with pt and completed [facility] screen. Pt agreeable to [facility] RN for CHF education/medication management. Pt states he is active with [facility]. HH RN arranged with [facility].   [facility] notified [facility] visit. Notification ID: [id number]  Expected Discharge Plan: Home w Home Health Services Barriers to Discharge: Continued Medical Work up   Patient Goals and CMS Choice            Expected Discharge Plan and Services       Living arrangements for the past 2 months: Single Family Home                                      Prior Living Arrangements/Services Living arrangements for the past 2 months: Single Family Home Lives with:: Spouse Patient language and need for interpreter reviewed:: Yes        Need for Family Participation in Patient Care: No (Comment) Care giver support system in place?: Yes (comment)   Criminal Activity/Legal Involvement Pertinent to Current Situation/Hospitalization: No - Comment as needed  Activities of Daily Living   ADL Screening (condition at time of admission) Independently performs ADLs?: Yes (appropriate for developmental age) Is the patient deaf or have difficulty hearing?: No Does the patient have difficulty seeing, even when wearing glasses/contacts?: No Does the patient have difficulty concentrating, remembering, or making decisions?: No  Permission Sought/Granted                  Emotional Assessment Appearance:: Appears stated age Attitude/Demeanor/Rapport: Engaged Affect (typically observed): Accepting Orientation: : Oriented to Self, Oriented to Place, Oriented to  Time, Oriented to Situation Alcohol / Substance Use: Not  Applicable Psych Involvement: No (comment)  Admission diagnosis:  Acute on chronic diastolic heart failure (HCC) [I50.33] Acute on chronic congestive heart failure, unspecified heart failure type (HCC) [I50.9] Patient Active Problem List   Diagnosis Date Noted   Acute on chronic diastolic heart failure (HCC) 02/24/2023   PAF (paroxysmal atrial fibrillation) (HCC) 01/26/2023   Demand ischemia (HCC) 01/26/2023   Anemia 01/26/2023   Elevated alkaline phosphatase in newborn 01/21/2023   Hyponatremia 01/18/2023   Shortness of breath 01/18/2023   GERD without esophagitis 01/18/2023   Elevated d-dimer 01/18/2023   Hyperkalemia 01/18/2023   Elevated transaminase level 01/18/2023   Medication management 01/01/2023   Palliative care encounter 12/31/2022   Goals of care, counseling/discussion 12/31/2022   Cancer associated pain 12/31/2022   Counseling and coordination of care 12/31/2022   Constipation 12/31/2022   Acute kidney injury (nontraumatic) (HCC) 12/30/2022   Presumed Metastatic cancer (HCC) 12/13/2022   MI with VFib Arrest in 2005/CAD S/P percutaneous coronary angioplasty 12/13/2022   CKD stage 3a, GFR 45-59 ml/min (HCC) 12/13/2022   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  [place] [address] Phone: [phone] Fax: [phone]  [facility] - [place], [place] - [address] [place]  [address] Phone: [phone] Fax: [phone]     Social Determinants of Health (SDOH) Social History: SDOH Screenings   Food Insecurity: No Food Insecurity (02/24/2023)  Housing: Patient Declined (02/24/2023)  Transportation Needs: No Transportation Needs (02/24/2023)  Utilities: Not At Risk (02/24/2023)  Tobacco Use: Medium Risk (02/24/2023)   SDOH Interventions:     Readmission Risk Interventions    01/18/2023   11:34 AM 12/14/2022   10:25 AM  Readmission Risk Prevention Plan  Transportation Screening Complete Complete  HRI or Home Care Consult  Complete  Social Work Consult for Recovery Care Planning/Counseling  Complete  Palliative Care Screening  Complete  Medication Review Ocean[employer]) Complete Complete  HRI or Home Care Consult Complete   SW Recovery Care/Counseling Consult Complete   Palliative Care Screening Not Applicable   Skilled Nursing Facility Not Applicable

## 2023-02-25 NOTE — Progress Notes (Signed)
Progress Note   Patient: Luis Donovan TKZ:[id number] DOB: 1945/06/17 DOA: 02/24/2023     0 DOS: the patient was seen and examined on 02/25/2023   Brief hospital admission course:  REHAN HINDMON is a 77 y.o. male with medical history significant of prostate cancer with bone metastasis, chronic pain, GERD, hypertension, hyperlipidemia, class I obesity, PTSD, chronic kidney disease stage IIIb and chronic diastolic heart failure; who presented to the hospital secondary to increased shortness of breath, dyspnea on exertion, orthopnea and noticing over 12 pounds weight gain.  Patient reports symptoms have been present over the last 10 days and worsening.  He was seen by PCP at the [facility] and expressed changes in his medications were made but he noticed no relief or improvement in his condition. Patient is afebrile, denies chest pain, no nausea, no vomiting, no sick contacts, expressed no dysuria, no hematuria, no melena, no hematochezia, no focal weaknesses or any other complaints.   In the ED positive signs of fluid overload and lower extremity swelling appreciated; short winded and unable to speak in full sentences at time of evaluation by EDP.  Patient was slightly hypoxic.  EKG without acute ischemic changes.  Elevated BNP and central vascular congestion on chest x-ray appreciated.  Patient received IV Lasix expressing increase in his urine output and feeling less short of breath.  TRH consulted to place patient in the hospital for further evaluation and management.  Assessment and Plan: 1-acute on chronic diastolic heart failure -Recent echo January 21, 2023 demonstrating ejection fraction 50 to 55% without wall motion abnormalities. -Patient demonstrating excellent response to diuresis -At time of admission approximately 12 pound weight gain and currently already down 6-7 pounds. -Heart rates clip of 31% -Continue IV diuresis for another 24 hours and continue to follow daily weights, strict I's and  O's and low-sodium diet. -No chest pain, no palpitations and reporting currently no orthopnea.  Still mildly short winded with activities and demonstrating signs of LE swelling.  2-chronic renal failure -Appears to be stable and at baseline -Patient with chronic kidney disease stage IIIb. -Continue minimizing nephrotoxic agents, follow renal function trend and electrolytes fluctuation.  3-GERD -Continue PPI.  4-history of metastatic prostate cancer -Continue patient follow-up with oncology service.  5-class I obesity -Body mass index is 32.31 kg/m. -Low-calorie diet and portion control discussed with patient.  6-moderate protein calorie malnutrition -In the setting of chronic disease and most likely component of malignancy -Continue feeding supplements (paying attention and controlling sodium intake).  7-chronic pain syndrome -In the setting of bone metastatic disease from prostate cancer -Continue home analgesic therapy.  8-hyponatremia -Chronic and most likely associated with fluid overload -Continue diuresis -Continue to follow sodium level trend.  9-anemia of chronic disease -In the setting of malignancy and chronic renal failure -No overt bleeding appreciated -No transfusion currently needed -Continue to follow hemoglobin trend.   Subjective:  No chest pain, no palpitations, no nausea, no vomiting.  Reporting short winded sensation and lower extremity swelling (improved).  No orthopnea on today's evaluation.  Physical Exam: Vitals:   02/24/23 1606 02/24/23 2004 02/25/23 0510 02/25/23 1446  BP: (!) 155/97 (!) 143/64 (!) 140/78 131/62  Pulse: 73 73 72 66  Resp: 19 18 16 18   Temp: 98.1 F (36.7 C) 98.4 F (36.9 C) 98.5 F (36.9 C) 98.1 F (36.7 C)  TempSrc:  Oral Oral Oral  SpO2: 98% 96% 99% 100%  Weight: 96.3 kg  93.6 kg   Height: 5\' 7"  (1.702  m)      General exam: Alert, awake, oriented x 3; feeling better and reporting good urine output. Respiratory  system: Improving movement bilaterally; no wheezing, no using accessory muscles and good saturation on room air. Cardiovascular system: Rate controlled, no rubs, no gallops, no JVD. Gastrointestinal system: Abdomen is obese, nondistended, soft and nontender. No organomegaly or masses felt. Normal bowel sounds heard. Central nervous system: No focal neurological deficits. Extremities: No cyanosis or clubbing; 1-2+ edema bilaterally appreciated. Skin: No petechiae. Psychiatry: Judgement and insight appear normal. Mood & affect appropriate.   Data Reviewed: Basic metabolic panel 132, potassium 3.7, chloride 95, bicarb 27, BUN 54, creatinine 1.52 and GFR 48 CBC: White blood cell 6.0, hemoglobin 8.5 platelet count 3 75K.  Family Communication: Wife at bedside.  Disposition: Status is: Inpatient Remains inpatient appropriate because: IV diuresis   Planned Discharge Destination: Home  Time spent: 40 minutes  Author: Vassie Loll, MD 02/25/2023 5:45 PM  For on call review [email or web address].

## 2023-02-26 DIAGNOSIS — E440 Moderate protein-calorie malnutrition: Secondary | ICD-10-CM | POA: Diagnosis not present

## 2023-02-26 DIAGNOSIS — E66811 Obesity, class 1: Secondary | ICD-10-CM

## 2023-02-26 DIAGNOSIS — I5033 Acute on chronic diastolic (congestive) heart failure: Secondary | ICD-10-CM | POA: Diagnosis not present

## 2023-02-26 LAB — BASIC METABOLIC PANEL
Anion gap: 10 (ref 5–15)
BUN: 43 mg/dL — ABNORMAL HIGH (ref 8–23)
CO2: 27 mmol/L (ref 22–32)
Calcium: 8.7 mg/dL — ABNORMAL LOW (ref 8.9–10.3)
Chloride: 97 mmol/L — ABNORMAL LOW (ref 98–111)
Creatinine, Ser: 1.33 mg/dL — ABNORMAL HIGH (ref 0.61–1.24)
GFR, Estimated: 56 mL/min — ABNORMAL LOW (ref >60)
Glucose, Bld: 94 mg/dL (ref 70–99)
Potassium: 3.7 mmol/L (ref 3.5–5.1)
Sodium: 134 mmol/L — ABNORMAL LOW (ref 135–145)

## 2023-02-26 MED ORDER — ADULT MULTIVITAMIN W/MINERALS CH: 1.0000 | ORAL_TABLET | Freq: Every day | ORAL | Status: DC

## 2023-02-26 MED ORDER — MIRTAZAPINE 15 MG PO TABS: 7.5000 mg | ORAL_TABLET | Freq: Every day | ORAL | Status: AC

## 2023-02-26 MED ORDER — ENSURE ENLIVE PO LIQD: 237.0000 mL | Freq: Two times a day (BID) | ORAL | Status: AC

## 2023-02-26 MED ORDER — TORSEMIDE 40 MG PO TABS: 40.0000 mg | ORAL_TABLET | Freq: Two times a day (BID) | ORAL | 2 refills | Status: DC

## 2023-02-26 NOTE — Discharge Summary (Signed)
Physician Discharge Summary   Patient: Luis Donovan MRN: [id number] DOB: [birth year]  Admit date:     02/24/2023  Discharge date: 02/26/23  Discharge Physician: Vassie Loll   PCP: [facility]   Recommendations at discharge:  Repeat basic metabolic panel to follow electrolytes and renal function Reassess blood pressure/volume status with further adjustment to patient's diuretic and antihypertensive management. Continue outpatient follow-up with cardiology/oncology service. Repeat CBC to follow hemoglobin trend/stability.  Discharge Diagnoses: Acute on chronic diastolic heart failure (HCC) Mia Chronic kidney disease stage IIIb Anemia of chronic disease Metastatic prostate cancer Chronic pain syndrome Gastroesophageal reflux disease Malnutrition of moderate degree Class 1 obesity  Brief hospital admission course:  Luis Donovan is a 77 y.o. male with medical history significant of prostate cancer with bone metastasis, chronic pain, GERD, hypertension, hyperlipidemia, class I obesity, PTSD, chronic kidney disease stage IIIb and chronic diastolic heart failure; who presented to the hospital secondary to increased shortness of breath, dyspnea on exertion, orthopnea and noticing over 12 pounds weight gain.  Patient reports symptoms have been present over the last 10 days and worsening.  He was seen by PCP at the [facility] and expressed changes in his medications were made but he noticed no relief or improvement in his condition. Patient is afebrile, denies chest pain, no nausea, no vomiting, no sick contacts, expressed no dysuria, no hematuria, no melena, no hematochezia, no focal weaknesses or any other complaints.   In the ED positive signs of fluid overload and lower extremity swelling appreciated; short winded and unable to speak in full sentences at time of evaluation by EDP.  Patient was slightly hypoxic.  EKG without acute ischemic changes.  Elevated BNP and central vascular  congestion on chest x-ray appreciated.  Patient received IV Lasix expressing increase in his urine output and feeling less short of breath.  TRH consulted to place patient in the hospital for further evaluation and management.  Assessment and Plan: 1-acute on chronic diastolic heart failure -Recent echo January 21, 2023 demonstrating ejection fraction 50 to 55% without wall motion abnormalities. -Patient demonstrating excellent response to diuresis -At time of admission approximately 12 pound weight gain and at time of discharge 10 pounds lighter. -REDs clip of 30-31% -Patient discharged home on torsemide 40 mg twice a day and instruction to continue following adequate hydration, low-sodium diet and to check weight on daily basis. -No chest pain, no palpitations and reporting currently no orthopnea.  Patient feeling breathing back to baseline, no oxygen required. -Outpatient follow-up with PCP in 10 days recommended -Continue patient follow-up with cardiology service.   2-chronic renal failure -Appears to be stable and at baseline -Patient with chronic kidney disease stage IIIb. -Continue minimizing nephrotoxic agents, follow renal function trend and electrolytes fluctuation.   3-GERD -Continue PPI.   4-history of metastatic prostate cancer -Continue patient follow-up with oncology service.   5-class I obesity -Body mass index is 32.31 kg/m. -Low-calorie diet and portion control discussed with patient.   6-moderate protein calorie malnutrition -In the setting of chronic disease and most likely component of malignancy -Continue feeding supplements (paying attention and controlling sodium intake).   7-chronic pain syndrome -In the setting of bone metastatic disease from prostate cancer -Continue home analgesic therapy.   8-hyponatremia -Chronic and most likely associated with fluid overload -Continue diuresis -Continue to follow sodium level trend.   9-anemia of chronic  disease -In the setting of malignancy and chronic renal failure -No overt bleeding appreciated -No transfusion currently needed -  Continue to follow hemoglobin trend.   Consultants: None Procedures performed: See below for x-ray reports. Disposition: Home Diet recommendation: Heart healthy/low-sodium diet.  DISCHARGE MEDICATION: Allergies as of 02/26/2023   No Known Allergies      Medication List     STOP taking these medications    cyclobenzaprine 5 MG tablet Commonly known as: FLEXERIL   morphine 15 MG 12 hr tablet Commonly known as: MS CONTIN       TAKE these medications    albuterol 108 (90 Base) MCG/ACT inhaler Commonly known as: VENTOLIN HFA Inhale 2 puffs into the lungs every 4 (four) hours as needed for wheezing or shortness of breath.   bisacodyl 5 MG EC tablet Commonly known as: DULCOLAX Take 10 mg by mouth daily as needed for moderate constipation.   bisoprolol 10 MG tablet Commonly known as: ZEBETA Take 1 tablet (10 mg total) by mouth daily.   Carboxymethylcellulose Sod PF 0.5 % Soln Place 1 drop into both eyes 2 (two) times daily. For dry eyes   Cholecalciferol 50 MCG (2000 UT) Tabs Take 1 tablet by mouth every morning.   clotrimazole 1 % cream Commonly known as: LOTRIMIN Apply 1 Application topically 2 (two) times daily. Apply a small amount to affected area twice daily for feet.   diclofenac Sodium 1 % Gel Commonly known as: VOLTAREN Apply 2 g topically 4 (four) times daily as needed (pain).   Eliquis 5 MG Tabs tablet Generic drug: apixaban Take 5 mg by mouth 2 (two) times daily.   feeding supplement Liqd Take 237 mLs by mouth 2 (two) times daily between meals.   FLUoxetine 20 MG capsule Commonly known as: PROZAC Take 80 mg by mouth daily.   KETOTIFEN FUMARATE OP Apply 1 drop to eye 2 (two) times daily as needed (allergies). 0.025%   latanoprost 0.005 % ophthalmic solution Commonly known as: XALATAN Place 1 drop into both eyes  at bedtime.   lidocaine 5 % Commonly known as: LIDODERM Place 1 patch onto the skin daily. Apply 1 patch to skin once daily (Apply for  hours, then remove for  hours)   losartan 100 MG tablet Commonly known as: COZAAR Take 50 mg by mouth daily.   mirtazapine 15 MG tablet Commonly known as: REMERON Take 0.5 tablets (7.5 mg total) by mouth at bedtime. For loss of appetite What changed: how much to take   multivitamin with minerals Tabs tablet Take 1 tablet by mouth daily.   omeprazole 20 MG capsule Commonly known as: PRILOSEC Take 20 mg by mouth 2 (two) times daily before a meal. Take on an empty stomach 30 minutes prior to a meal   ondansetron 8 MG tablet Commonly known as: ZOFRAN Take 1 tablet (8 mg total) by mouth every 8 (eight) hours as needed for nausea or vomiting.   Oxycodone HCl 10 MG Tabs Take 10 mg by mouth every 4 (four) hours as needed (severe pain).   polyethylene glycol 17 g packet Commonly known as: MIRALAX / GLYCOLAX Take 17 g by mouth 2 (two) times daily.   potassium chloride 10 MEQ tablet Commonly known as: KLOR-CON M Take 1 tablet (10 mEq total) by mouth every other day.   timolol 0.5 % ophthalmic gel-forming Commonly known as: TIMOPTIC-XR Apply 1 drop to eye daily.   Torsemide 40 MG Tabs Take 40 mg by mouth 2 (two) times daily. What changed:  when to take this additional instructions   traZODone 50 MG tablet Commonly known as: DESYREL  Take 50 mg by mouth at bedtime.   Xtampza ER 9 MG C12a Generic drug: oxyCODONE ER Take 9 mg by mouth every 12 (twelve) hours.        Follow-up Information     [place] Follow up.   Why: A home health nurse has been arranged with [facility]. They will call you to schedule after discharge. Contact information: [address] [place] [address] [phone]         [facility] Schedule an appointment as soon as possible for a visit in 10 day(s).   Contact  information: [address] [facility] [place] [address] [phone]                Discharge Exam: Filed Weights   02/24/23 1606 02/25/23 0510 02/26/23 0504  Weight: 96.3 kg 93.6 kg 92.5 kg   General exam: Alert, awake, oriented x 3; reporting no chest pain, no palpitations, no nausea, no vomiting, no orthopnea and breathing back to baseline. Respiratory system: No using accessory muscles; improved air movement bilaterally and without any wheezes or crackles. Cardiovascular system: Rate controlled, no rubs, no gallops, no JVD. Gastrointestinal system: Abdomen is nondistended, soft and nontender. No organomegaly or masses felt. Normal bowel sounds heard. Central nervous system: No focal neurological deficits. Extremities: No cyanosis or clubbing; trace edema appreciated bilaterally. Skin: No petechiae. Psychiatry: Judgement and insight appear normal. Mood & affect appropriate.    Condition at discharge: Stable and improved.  The results of significant diagnostics from this hospitalization (including imaging, microbiology, ancillary and laboratory) are listed below for reference.   Imaging Studies: DG Chest 2 View  Result Date: 02/24/2023 CLINICAL DATA:  Shortness of breath.  History of prostate cancer EXAM: CHEST - 2 VIEW COMPARISON:  X-ray 01/17/2023 FINDINGS: Left upper chest battery pack for defibrillator with leads along the right side of the heart. Left shoulder reverse arthroplasty. Multifocal sclerotic bone lesions identified consistent with known metastatic disease. Stable cardiopericardial silhouette. No pneumothorax or edema. Tiny pleural effusions. Degenerative changes along the spine. IMPRESSION: Defibrillator.  Tiny pleural effusion versus pleural thickening. Multifocal sclerotic bone lesions consistent with known history of metastatic prostate cancer. Electronically Signed   By: Karen Kays M.D.   On: 02/24/2023 13:35   NM PET (PSMA) SKULL TO MID  THIGH  Result Date: 02/10/2023 CLINICAL DATA:  High risk prostate carcinoma staging. EXAM: NUCLEAR MEDICINE PET SKULL BASE TO THIGH TECHNIQUE: 7.3 mCi Flotufolastat (Posluma) was injected intravenously. Full-ring PET imaging was performed from the skull base to thigh after the radiotracer. CT data was obtained and used for attenuation correction and anatomic localization. COMPARISON:  CT 12/13/2022 FINDINGS: NECK No radiotracer activity in neck lymph nodes. Incidental CT finding: None. CHEST Intensely radiotracer avid LEFT supraclavicular nodes with SUV max equal 21 on image 39. There is relatively small measuring approximately 8 mm each. Intensely radiotracer avid prevascular lymph node measures 7 mm on image 48. Incidental CT finding: No suspicious pulmonary nodules ABDOMEN/PELVIS Prostate: 2 foci moderate radiotracer activity in the posterior LEFT and RIGHT lobe of the prostate gland (image 168). Intense radiotracer avid focus of soft tissue thickening along the posterior margin of the bladder just RIGHT of midline measuring 2.9 x 1.6 cm (image 157/CT series 4). This lesion is adjacent to the RIGHT seminal vesicle has intense radiotracer activity SUV max equal 20.4 on image 158 Lymph nodes: Intensely radiotracer avid bilateral iliac lymph nodes. Example RIGHT external iliac lymph node measures 11 mm  on image 159 with SUV max equal 15.8 Metastatic lymph nodes extend in the retroperitoneum LEFT aorta. Enlarged intense radiotracer avid node LEFT aorta level the kidneys measures 17 mm on image 112. Radiotracer avid retrocrural node on image 94. Liver: No evidence of liver metastasis. Incidental CT finding: Atherosclerotic calcification of the aorta. SKELETON Widespread intensely radiotracer avid skeletal metastasis. Metastasis are near confluent throughout the pelvis, spine, ribs and shoulder girdles. Lesions are accompanied sclerotic changes within the bone on CT. For example lesion at sclerotic lesion at L5 SUV  max equals 27.2 on image 135. Every vertebral body involved. Example activity in the sternum with SUV max equal 20. IMPRESSION: 1. Two foci of moderate radiotracer activity in the posterior LEFT and RIGHT lobe of the prostate gland consistent with primary prostate adenocarcinoma. 2. Larger soft tissue with intensely radiotracer avid lesion along the posterior margin of the bladder and RIGHT seminal vesicle consistent with local extension of prostate adenocarcinoma. 3. Intensely radiotracer avid metastatic adenopathy in the pelvis, retroperitoneum, mediastinum and LEFT supraclavicular nodal station. 4. 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 5.  Aortic Atherosclerosis (ICD10-I70.0). Electronically Signed   By: Genevive Bi M.D.   On: 02/10/2023 11:46    Microbiology: Results for orders placed or performed during the hospital encounter of 01/17/23  MRSA Next Gen by PCR, Nasal     Status: None   Collection Time: 01/18/23  4:22 AM   Specimen: Nasal Mucosa; Nasal Swab  Result Value Ref Range Status   MRSA by PCR Next Gen NOT DETECTED NOT DETECTED Final    Comment: (NOTE) The GeneXpert MRSA Assay (FDA approved for NASAL specimens only), is one component of a comprehensive MRSA colonization surveillance program. It is not intended to diagnose MRSA infection nor to guide or monitor treatment for MRSA infections. Test performance is not FDA approved in patients less than 63 years old. Performed at [facility], [address], [place], [place] [address]     Labs: CBC: Recent Labs  Lab 02/24/23 1210 02/25/23 0436  WBC 6.7 6.0  HGB 8.8* 8.5*  HCT 27.8* 27.9*  MCV 97.5 98.2  PLT 348 375   Basic Metabolic Panel: Recent Labs  Lab 02/24/23 1210 02/24/23 1358 02/25/23 0436 02/26/23 0432  NA 130*  --  132* 134*  K 4.5  --  3.7 3.7  CL 91*  --  95* 97*  CO2 26  --  27 27  GLUCOSE 101*  --  86 94  BUN 63*  --  54*  43*  CREATININE 1.81*  --  1.52* 1.33*  CALCIUM 8.6*  --  8.4* 8.7*  MG  --  2.3  --   --   PHOS  --  3.6  --   --     Discharge time spent: greater than 30 minutes.  Signed: Vassie Loll, MD [facility] 02/26/2023

## 2023-02-26 NOTE — Progress Notes (Signed)
   02/26/23 1021  ReDS Vest / Clip  Station Marker D  Ruler Value 42  ReDS Value Range 36 - 40  ReDS Actual Value 37   MD aware

## 2023-02-26 NOTE — TOC Transition Note (Signed)
Transition of Care [facility]) - CM/SW Discharge Note   Patient Details  Name: Luis Donovan MRN: [id number] Date of Birth: 07-Jan-1946  [facility] Care [facility]) CM/SW Contact:  Villa Herb, LCSWA Phone Number: 02/26/2023, 9:39 AM   Clinical Narrative:    CSW updated that pt will D/C home today. CSW requested [facility] RN orders be placed, MD completed. CSW updated Morrie Sheldon with [facility] of plan for D/C. HH agency info added to AVS for pt to review at D/C. TOC signing off.   Final next level of care: Home w Home Health Services Barriers to Discharge: Barriers Resolved   Patient Goals and CMS Choice CMS Medicare.gov Compare Post Acute Care list provided to:: Patient Choice offered to / list presented to : Patient  Discharge Placement                         Discharge Plan and Services Additional resources added to the After Visit Summary for                            [facility] Arranged: RN [facility] Agency: [facility] Date [facility] Agency Contacted: 02/26/23   Representative spoke with at [facility] Agency: Morrie Sheldon  Social Determinants of Health (SDOH) Interventions SDOH Screenings   Food Insecurity: No Food Insecurity (02/24/2023)  Housing: Patient Declined (02/24/2023)  Transportation Needs: No Transportation Needs (02/24/2023)  Utilities: Not At Risk (02/24/2023)  Tobacco Use: Medium Risk (02/24/2023)     Readmission Risk Interventions    02/26/2023    9:38 AM 01/18/2023   11:34 AM 12/14/2022   10:25 AM  Readmission Risk Prevention Plan  Transportation Screening Complete Complete Complete  HRI or Home Care Consult   Complete  Social Work Consult for Recovery Care Planning/Counseling   Complete  Palliative Care Screening   Complete  Medication Review Oceanographer) Complete Complete Complete  HRI or Home Care Consult Complete Complete   SW Recovery Care/Counseling Consult Complete Complete   Palliative Care Screening Not Applicable Not Applicable   Skilled  Nursing Facility Not Applicable Not Applicable

## 2023-02-26 NOTE — TOC Transition Note (Deleted)
Transition of Care [facility]) - CM/SW Discharge Note   Patient Details  Name: Luis Donovan MRN: [id number] Date of Birth: [birth year]  Transition of Care [facility]) CM/SW Contact:  Villa Herb, LCSWA Phone Number: 02/26/2023, 9:36 AM   Clinical Narrative:    CSW updated that pt will D/C home today. CSW requested [facility] RN orders be placed, MD completed. CSW updated Morrie Sheldon with [facility] of plan for D/C. HH agency info added to AVS for pt to review at D/C. TOC signing off.   Final next level of care: Home w Home Health Services Barriers to Discharge: Barriers Resolved   Patient Goals and CMS Choice CMS Medicare.gov Compare Post Acute Care list provided to:: Patient Choice offered to / list presented to : Patient  Discharge Placement                         Discharge Plan and Services Additional resources added to the After Visit Summary for                            [facility] Arranged: RN [facility] Agency: [facility] Date [facility] Agency Contacted: 02/26/23   Representative spoke with at [facility] Agency: Morrie Sheldon  Social Determinants of Health (SDOH) Interventions SDOH Screenings   Food Insecurity: No Food Insecurity (02/24/2023)  Housing: Patient Declined (02/24/2023)  Transportation Needs: No Transportation Needs (02/24/2023)  Utilities: Not At Risk (02/24/2023)  Tobacco Use: Medium Risk (02/24/2023)     Readmission Risk Interventions    01/18/2023   11:34 AM 12/14/2022   10:25 AM  Readmission Risk Prevention Plan  Transportation Screening Complete Complete  HRI or Home Care Consult  Complete  Social Work Consult for Recovery Care Planning/Counseling  Complete  Palliative Care Screening  Complete  Medication Review Oceanographer) Complete Complete  HRI or Home Care Consult Complete   SW Recovery Care/Counseling Consult Complete   Palliative Care Screening Not Applicable   Skilled Nursing Facility Not Applicable

## 2023-03-08 ENCOUNTER — Encounter (HOSPITAL_COMMUNITY): Payer: Self-pay

## 2023-03-08 ENCOUNTER — Other Ambulatory Visit: Payer: Self-pay

## 2023-03-08 ENCOUNTER — Encounter (HOSPITAL_COMMUNITY)
Admission: RE | Admit: 2023-03-08 | Discharge: 2023-03-08 | Disposition: A | Discharge status: Home / Self Care | Payer: No Typology Code available for payment source | Source: Ambulatory Visit | Attending: Urology | Admitting: Urology

## 2023-03-08 HISTORY — DX: Gastro-esophageal reflux disease without esophagitis: K21.9

## 2023-03-11 ENCOUNTER — Encounter (HOSPITAL_COMMUNITY)
Admission: RE | Disposition: A | Discharge status: Home / Self Care | Payer: Self-pay | Source: Home / Self Care | Attending: Urology

## 2023-03-11 ENCOUNTER — Ambulatory Visit (HOSPITAL_COMMUNITY): Payer: No Typology Code available for payment source | Admitting: Anesthesiology

## 2023-03-11 ENCOUNTER — Ambulatory Visit (HOSPITAL_COMMUNITY)
Admission: RE | Admit: 2023-03-11 | Discharge: 2023-03-11 | Disposition: A | Discharge status: Home / Self Care | Payer: No Typology Code available for payment source | Attending: Urology | Admitting: Urology

## 2023-03-11 ENCOUNTER — Ambulatory Visit (HOSPITAL_BASED_OUTPATIENT_CLINIC_OR_DEPARTMENT_OTHER): Payer: No Typology Code available for payment source | Admitting: Anesthesiology

## 2023-03-11 ENCOUNTER — Encounter (HOSPITAL_COMMUNITY): Payer: Self-pay | Admitting: Urology

## 2023-03-11 DIAGNOSIS — I2510 Atherosclerotic heart disease of native coronary artery without angina pectoris: Secondary | ICD-10-CM | POA: Diagnosis not present

## 2023-03-11 DIAGNOSIS — I110 Hypertensive heart disease with heart failure: Secondary | ICD-10-CM | POA: Insufficient documentation

## 2023-03-11 DIAGNOSIS — D494 Neoplasm of unspecified behavior of bladder: Secondary | ICD-10-CM

## 2023-03-11 DIAGNOSIS — C7911 Secondary malignant neoplasm of bladder: Secondary | ICD-10-CM | POA: Insufficient documentation

## 2023-03-11 DIAGNOSIS — G4730 Sleep apnea, unspecified: Secondary | ICD-10-CM | POA: Insufficient documentation

## 2023-03-11 DIAGNOSIS — K219 Gastro-esophageal reflux disease without esophagitis: Secondary | ICD-10-CM | POA: Diagnosis not present

## 2023-03-11 DIAGNOSIS — C676 Malignant neoplasm of ureteric orifice: Secondary | ICD-10-CM | POA: Diagnosis not present

## 2023-03-11 DIAGNOSIS — Z8673 Personal history of transient ischemic attack (TIA), and cerebral infarction without residual deficits: Secondary | ICD-10-CM | POA: Diagnosis not present

## 2023-03-11 DIAGNOSIS — C61 Malignant neoplasm of prostate: Secondary | ICD-10-CM

## 2023-03-11 DIAGNOSIS — I509 Heart failure, unspecified: Secondary | ICD-10-CM | POA: Insufficient documentation

## 2023-03-11 DIAGNOSIS — F419 Anxiety disorder, unspecified: Secondary | ICD-10-CM | POA: Diagnosis not present

## 2023-03-11 DIAGNOSIS — Z87891 Personal history of nicotine dependence: Secondary | ICD-10-CM | POA: Insufficient documentation

## 2023-03-11 HISTORY — PX: CYSTOSCOPY WITH BIOPSY: SHX5122

## 2023-03-11 HISTORY — PX: CYSTOSCOPY WITH STENT PLACEMENT: SHX5790

## 2023-03-11 HISTORY — PX: TRANSURETHRAL RESECTION OF BLADDER TUMOR: SHX2575

## 2023-03-11 SURGERY — CYSTOSCOPY, WITH BIOPSY
Anesthesia: General | Site: Ureter | Laterality: Right

## 2023-03-11 MED ORDER — DEXAMETHASONE SODIUM PHOSPHATE 10 MG/ML IJ SOLN
INTRAMUSCULAR | Status: AC
Start: 2023-03-11 — End: ?
  Filled 2023-03-11: qty 1

## 2023-03-11 MED ORDER — CHLORHEXIDINE GLUCONATE 0.12 % MT SOLN
15.0000 mL | Freq: Once | OROMUCOSAL | Status: AC
  Administered 2023-03-11: 15 mL via OROMUCOSAL

## 2023-03-11 MED ORDER — SUGAMMADEX SODIUM 200 MG/2ML IV SOLN
INTRAVENOUS | Status: DC | PRN
  Administered 2023-03-11: 200 mg via INTRAVENOUS

## 2023-03-11 MED ORDER — FENTANYL CITRATE PF 50 MCG/ML IJ SOSY
25.0000 ug | PREFILLED_SYRINGE | INTRAMUSCULAR | Status: DC | PRN
  Administered 2023-03-11 (×2): 50 ug via INTRAVENOUS
  Filled 2023-03-11 (×2): qty 1

## 2023-03-11 MED ORDER — DEXAMETHASONE SODIUM PHOSPHATE 10 MG/ML IJ SOLN
INTRAMUSCULAR | Status: DC | PRN
  Administered 2023-03-11: 10 mg via INTRAVENOUS

## 2023-03-11 MED ORDER — EPHEDRINE 5 MG/ML INJ
INTRAVENOUS | Status: AC
  Filled 2023-03-11: qty 5

## 2023-03-11 MED ORDER — EPHEDRINE SULFATE-NACL 50-0.9 MG/10ML-% IV SOSY
PREFILLED_SYRINGE | INTRAVENOUS | Status: DC | PRN
Start: 2023-03-11 — End: 2023-03-11
  Administered 2023-03-11: 5 mg via INTRAVENOUS
  Administered 2023-03-11 (×2): 10 mg via INTRAVENOUS

## 2023-03-11 MED ORDER — LIDOCAINE HCL (CARDIAC) PF 100 MG/5ML IV SOSY
PREFILLED_SYRINGE | INTRAVENOUS | Status: DC | PRN
  Administered 2023-03-11: 100 mg via INTRAVENOUS

## 2023-03-11 MED ORDER — PHENYLEPHRINE HCL (PRESSORS) 10 MG/ML IV SOLN
INTRAVENOUS | Status: DC | PRN
  Administered 2023-03-11 (×2): 80 ug via INTRAVENOUS

## 2023-03-11 MED ORDER — OXYCODONE HCL 5 MG/5ML PO SOLN: 5.0000 mg | Freq: Once | ORAL | Status: AC | PRN

## 2023-03-11 MED ORDER — FENTANYL CITRATE (PF) 100 MCG/2ML IJ SOLN
INTRAMUSCULAR | Status: AC
  Filled 2023-03-11: qty 2

## 2023-03-11 MED ORDER — GLYCOPYRROLATE PF 0.2 MG/ML IJ SOSY
PREFILLED_SYRINGE | INTRAMUSCULAR | Status: AC
Start: 2023-03-11 — End: ?
  Filled 2023-03-11: qty 1

## 2023-03-11 MED ORDER — PROPOFOL 10 MG/ML IV BOLUS
INTRAVENOUS | Status: DC | PRN
  Administered 2023-03-11: 50 mg via INTRAVENOUS
  Administered 2023-03-11: 100 mg via INTRAVENOUS
  Administered 2023-03-11: 50 mg via INTRAVENOUS

## 2023-03-11 MED ORDER — LACTATED RINGERS IV SOLN: INTRAVENOUS | Status: DC | PRN

## 2023-03-11 MED ORDER — LIDOCAINE HCL (PF) 2 % IJ SOLN
INTRAMUSCULAR | Status: AC
  Filled 2023-03-11: qty 5

## 2023-03-11 MED ORDER — ORAL CARE MOUTH RINSE
15.0000 mL | Freq: Once | OROMUCOSAL | Status: AC
Start: 2023-03-11 — End: 2023-03-11

## 2023-03-11 MED ORDER — ONDANSETRON HCL 4 MG/2ML IJ SOLN: 4.0000 mg | Freq: Once | INTRAMUSCULAR | Status: DC | PRN

## 2023-03-11 MED ORDER — HYDROCODONE-ACETAMINOPHEN 5-325 MG PO TABS: 1.0000 | ORAL_TABLET | Freq: Four times a day (QID) | ORAL | 0 refills | Status: DC | PRN

## 2023-03-11 MED ORDER — STERILE WATER FOR IRRIGATION IR SOLN
Status: DC | PRN
  Administered 2023-03-11: 500 mL

## 2023-03-11 MED ORDER — PHENYLEPHRINE 80 MCG/ML (10ML) SYRINGE FOR IV PUSH (FOR BLOOD PRESSURE SUPPORT)
PREFILLED_SYRINGE | INTRAVENOUS | Status: AC
  Filled 2023-03-11: qty 10

## 2023-03-11 MED ORDER — SODIUM CHLORIDE 0.9 % IR SOLN
Status: DC | PRN
  Administered 2023-03-11: 3000 mL via INTRAVESICAL

## 2023-03-11 MED ORDER — CEFAZOLIN SODIUM-DEXTROSE 2-4 GM/100ML-% IV SOLN
2.0000 g | INTRAVENOUS | Status: AC
  Administered 2023-03-11: 2 g via INTRAVENOUS
  Filled 2023-03-11: qty 100

## 2023-03-11 MED ORDER — FENTANYL CITRATE (PF) 250 MCG/5ML IJ SOLN
INTRAMUSCULAR | Status: DC | PRN
  Administered 2023-03-11: 25 ug via INTRAVENOUS
  Administered 2023-03-11: 50 ug via INTRAVENOUS

## 2023-03-11 MED ORDER — ROCURONIUM BROMIDE 10 MG/ML (PF) SYRINGE
PREFILLED_SYRINGE | INTRAVENOUS | Status: DC | PRN
  Administered 2023-03-11: 50 mg via INTRAVENOUS
  Administered 2023-03-11 (×2): 10 mg via INTRAVENOUS

## 2023-03-11 MED ORDER — ONDANSETRON HCL 4 MG/2ML IJ SOLN
INTRAMUSCULAR | Status: AC
  Filled 2023-03-11: qty 2

## 2023-03-11 MED ORDER — OXYCODONE HCL 5 MG PO TABS
5.0000 mg | ORAL_TABLET | Freq: Once | ORAL | Status: AC | PRN
  Administered 2023-03-11: 5 mg via ORAL
  Filled 2023-03-11: qty 1

## 2023-03-11 MED ORDER — ONDANSETRON HCL 4 MG/2ML IJ SOLN
INTRAMUSCULAR | Status: DC | PRN
  Administered 2023-03-11: 4 mg via INTRAVENOUS

## 2023-03-11 MED ORDER — ROCURONIUM BROMIDE 10 MG/ML (PF) SYRINGE
PREFILLED_SYRINGE | INTRAVENOUS | Status: AC
Start: 2023-03-11 — End: ?
  Filled 2023-03-11: qty 10

## 2023-03-11 MED ORDER — PROPOFOL 10 MG/ML IV BOLUS
INTRAVENOUS | Status: AC
  Filled 2023-03-11: qty 20

## 2023-03-11 MED ORDER — MIDAZOLAM HCL 2 MG/2ML IJ SOLN
INTRAMUSCULAR | Status: AC
  Filled 2023-03-11: qty 2

## 2023-03-11 MED ORDER — GLYCOPYRROLATE PF 0.2 MG/ML IJ SOSY
PREFILLED_SYRINGE | INTRAMUSCULAR | Status: DC | PRN
  Administered 2023-03-11: .2 mg via INTRAVENOUS

## 2023-03-11 MED ORDER — BACITRACIN ZINC 500 UNIT/GM EX OINT
TOPICAL_OINTMENT | CUTANEOUS | Status: AC
  Filled 2023-03-11: qty 3.6

## 2023-03-11 SURGICAL SUPPLY — 34 items
BAG DRAIN URO TABLE W/ADPT NS (BAG) ×3 IMPLANT
BAG DRN 8 ADPR NS SKTRN CSTL (BAG) ×2
BAG DRN RND TRDRP ANRFLXCHMBR (UROLOGICAL SUPPLIES) ×2
BAG HAMPER (MISCELLANEOUS) ×3 IMPLANT
BAG URINE DRAIN 2000ML AR STRL (UROLOGICAL SUPPLIES) ×3 IMPLANT
CATH FOLEY 3WAY 30CC 22FR (CATHETERS) IMPLANT
CATH URETL OPEN END 6FR 70 (CATHETERS) IMPLANT
ELECT LOOP 22F BIPOLAR SML (ELECTROSURGICAL) ×2
ELECT REM PT RETURN 9FT ADLT (ELECTROSURGICAL) ×2
ELECTRODE LOOP 22F BIPOLAR SML (ELECTROSURGICAL) ×3 IMPLANT
ELECTRODE REM PT RTRN 9FT ADLT (ELECTROSURGICAL) ×3 IMPLANT
GLOVE BIO SURGEON STRL SZ8 (GLOVE) ×3 IMPLANT
GLOVE BIOGEL PI IND STRL 7.0 (GLOVE) ×6 IMPLANT
GLOVE BIOGEL PI IND STRL 8 (GLOVE) IMPLANT
GLOVE SURG SS PI 8.0 STRL IVOR (GLOVE) IMPLANT
GOWN STRL REUS W/TWL XL LVL3 (GOWN DISPOSABLE) ×3 IMPLANT
GUIDEWIRE STR ZIPWIRE 035X150 (MISCELLANEOUS) IMPLANT
IV NS IRRIG 3000ML ARTHROMATIC (IV SOLUTION) ×6 IMPLANT
KIT CHEMO SPILL (MISCELLANEOUS) ×3 IMPLANT
KIT TURNOVER CYSTO (KITS) ×3 IMPLANT
MANIFOLD NEPTUNE II (INSTRUMENTS) ×3 IMPLANT
NDL HYPO 18GX1.5 BLUNT FILL (NEEDLE) ×3 IMPLANT
NEEDLE HYPO 18GX1.5 BLUNT FILL (NEEDLE)
PACK CYSTO (CUSTOM PROCEDURE TRAY) ×3 IMPLANT
PAD ARMBOARD 7.5X6 YLW CONV (MISCELLANEOUS) ×3 IMPLANT
PLUG CATH AND CAP STER (CATHETERS) IMPLANT
POSITIONER HEAD 8X9X4 ADT (SOFTGOODS) ×3 IMPLANT
STENT URET 6FRX26 CONTOUR (STENTS) IMPLANT
SYR 30ML LL (SYRINGE) ×3 IMPLANT
SYR TOOMEY IRRIG 70ML (MISCELLANEOUS) ×2
SYRINGE TOOMEY IRRIG 70ML (MISCELLANEOUS) ×3 IMPLANT
TOWEL OR 17X26 4PK STRL BLUE (TOWEL DISPOSABLE) ×3 IMPLANT
WATER STERILE IRR 3000ML UROMA (IV SOLUTION) ×3 IMPLANT
WATER STERILE IRR 500ML POUR (IV SOLUTION) ×3 IMPLANT

## 2023-03-11 NOTE — Op Note (Signed)
.  Preoperative diagnosis: bladder tumor  Postoperative diagnosis: Same  Procedure: 1 cystoscopy 2.  Transurethral resection of bladder tumor, medium 3. Right 6x26 JJ ureteral stent placement  Attending: Wilkie Aye  Anesthesia: General  Estimated blood loss: Minimal  Drains: 1. 22 French foley 2. Right 6x26 JJ ureteral stent  Specimens: bladder trigone tumor  Antibiotics: ancef  Findings: 3cm sessile tumor involving right ureteral orifice. Ureteral orifices in normal anatomic location.   Indications: Patient is a 77 year old male with a history of metastatic disease of unknown primary and a bladder tumor found on CT and office cystoscopy. After discussing treatment options, they decided proceed with transurethral resection of a bladder tumor.  Procedure in detail: The patient was brought to the operating room and a brief timeout was done to ensure correct patient, correct procedure, correct site.  General anesthesia was administered patient was placed in dorsal lithotomy position.  Their genitalia was then prepped and draped in usual sterile fashion.  A rigid 22 French cystoscope was passed in the urethra and the bladder.  Bladder was inspected and we noted a 3 cm bladder tumor.  the ureteral orifices were in the normal orthotopic locations.  a 6 french ureteral catheter was then instilled into the right ureteral orifice. We then advanced a zipwire up to the renal pelvis. We then advanced a 6x26 JJ ureteral stetn up the zipwire to the renal pelvis. We then removed the wire and good coil was noted in the bladder under direct vision.  We then removed the cystoscope and placed a resectoscope into the bladder. Using the bipolar resectoscope we removed the bladder tumor down to the base. Hemostasis was then obtained with electrocautery. We then removed the bladder tumor chips and sent them for pathology. We then re-inspected the bladder and found no residula bleeding.  the bladder was then  drained, a 22 French foley was placed and this concluded the procedure which was well tolerated by patient. 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  Complications: None  Condition: Stable, extubated, transferred to PACU  Plan: Patient is to be discharged home and followup in 5 days for foley catheter removal and pathology discussion.

## 2023-03-11 NOTE — Anesthesia Preprocedure Evaluation (Addendum)
Anesthesia Evaluation  Patient identified by MRN, date of birth, ID band Patient awake    Reviewed: Allergy & Precautions, H&P , NPO status , Patient's Chart, lab work & pertinent test results, reviewed documented beta blocker date and time   Airway Mallampati: II  TM Distance: >3 FB Neck ROM: full    Dental  (+) Dental Advisory Given, Partial Upper   Pulmonary shortness of breath and with exertion, sleep apnea , former smoker   Pulmonary exam normal breath sounds clear to auscultation       Cardiovascular hypertension, + CAD and +CHF  Normal cardiovascular exam Rhythm:regular Rate:Normal  Echo OK EF   Neuro/Psych  PSYCHIATRIC DISORDERS Anxiety     CVA  negative psych ROS   GI/Hepatic Neg liver ROS,GERD  ,,  Endo/Other  negative endocrine ROS    Renal/GU negative Renal ROS  negative genitourinary   Musculoskeletal   Abdominal   Peds  Hematology negative hematology ROS (+)   Anesthesia Other Findings   Reproductive/Obstetrics negative OB ROS                             Anesthesia Physical Anesthesia Plan  ASA: 3  Anesthesia Plan: General   Post-op Pain Management:    Induction: Intravenous  PONV Risk Score and Plan: Ondansetron, Dexamethasone and Midazolam  Airway Management Planned: LMA  Additional Equipment: None  Intra-op Plan:   Post-operative Plan:   Informed Consent: I have reviewed the patients History and Physical, chart, labs and discussed the procedure including the risks, benefits and alternatives for the proposed anesthesia with the patient or authorized representative who has indicated his/her understanding and acceptance.     Dental Advisory Given  Plan Discussed with: CRNA  Anesthesia Plan Comments:        Anesthesia Quick Evaluation

## 2023-03-11 NOTE — Anesthesia Postprocedure Evaluation (Signed)
Anesthesia Post Note  Patient: Luis Donovan  Procedure(s) Performed: CYSTOSCOPY WITH BIOPSY TRANSURETHRAL RESECTION OF BLADDER TUMOR (TURBT) CYSTOSCOPY WITH STENT PLACEMENT (Right: Ureter)  Patient location during evaluation: PACU Anesthesia Type: General Level of consciousness: awake and alert Pain management: pain level controlled Vital Signs Assessment: post-procedure vital signs reviewed and stable Respiratory status: spontaneous breathing, nonlabored ventilation, respiratory function stable and patient connected to nasal cannula oxygen Cardiovascular status: blood pressure returned to baseline and stable Postop Assessment: no apparent nausea or vomiting Anesthetic complications: no   There were no known notable events for this encounter.   Last Vitals:  Vitals:   03/11/23 1015 03/11/23 1022  BP: (!) 162/76   Pulse: 74 74  Resp: 15 18  Temp:    SpO2: 99% 94%    Last Pain:  Vitals:   03/11/23 1015  PainSc: 4                  Marvetta Vohs L Dalya Maselli

## 2023-03-11 NOTE — Transfer of Care (Signed)
Immediate Anesthesia Transfer of Care Note  Patient: Luis Donovan  Procedure(s) Performed: CYSTOSCOPY WITH BIOPSY TRANSURETHRAL RESECTION OF BLADDER TUMOR (TURBT) CYSTOSCOPY WITH STENT PLACEMENT (Right: Ureter)  Patient Location: PACU  Anesthesia Type:General  Level of Consciousness: awake, alert , oriented, and patient cooperative  Airway & Oxygen Therapy: Patient Spontanous Breathing and Patient connected to nasal cannula oxygen  Post-op Assessment: Report given to RN, Post -op Vital signs reviewed and stable, and Patient moving all extremities X 4  Post vital signs: Reviewed and stable  Last Vitals:  Vitals Value Taken Time  BP 129/109 03/11/23 0945  Temp 98.2   Pulse 72 03/11/23 0950  Resp 21 03/11/23 0950  SpO2 99 % 03/11/23 0950  Vitals shown include unfiled device data.  Last Pain:  Vitals:   03/11/23 0823  PainSc: 4       Patients Stated Pain Goal: 3 (03/11/23 1610)  Complications: No notable events documented.

## 2023-03-11 NOTE — Progress Notes (Signed)
While going over patient's discharge instructions, pt stated that he felt an urge to have a BM. Pt reports lots of pressure to bladder and rectal area. Pt assisted to bathroom. Upon standing for transfer pt reports that the pressure eased up and he did not feel the urge to have bm anymore. Pt requested to stand upright at the toilet. Noted to have small amount of bloody drainage from penis. Pt assisted back to room. Dr. Ronne Binning notified of reports of pressure and drainage. He arrived to room around 1140 to assess patient. Advised pt and spouse that everything looked WNL, verbal order given to use Bactritracin ointment to tip of penis for several days. Completed this education with spouse and she verbalized understanding. Pt assisted with dressing and discharged.

## 2023-03-11 NOTE — Anesthesia Procedure Notes (Signed)
Procedure Name: Intubation Date/Time: 03/11/2023 8:52 AM  Performed by: Jeanette Caprice, CRNAPre-anesthesia Checklist: Patient identified, Emergency Drugs available, Suction available and Patient being monitored Patient Re-evaluated:Patient Re-evaluated prior to induction Oxygen Delivery Method: Circle system utilized Preoxygenation: Pre-oxygenation with 100% oxygen Induction Type: IV induction Laryngoscope Size: Mac and 3 Grade View: Grade I Tube type: Oral Number of attempts: 1 Airway Equipment and Method: Stylet Placement Confirmation: ETT inserted through vocal cords under direct vision, positive ETCO2 and breath sounds checked- equal and bilateral Secured at: 23 cm Tube secured with: Tape Dental Injury: Teeth and Oropharynx as per pre-operative assessment

## 2023-03-11 NOTE — H&P (Signed)
HPI: Luis Donovan is a 77yo here for Bladder tumor resection. He was hospitalized with AKI and found to have a bladder mass and metastatic disease with an unknown primary. No gross hematuria .      PMH:     Past Medical History:  Diagnosis Date   Anemia     CVA (cerebral vascular accident) (HCC) 07/17/2013   Empyema (HCC)     Erectile dysfunction due to arterial insufficiency     Essential (primary) hypertension     Gastro-esophageal reflux disease with esophagitis     Head injury     Heart disease     Hyperlipidemia, unspecified     Hypertension     Obesity, unspecified     Pneumonia     Post-traumatic stress disorder, unspecified     Sleep apnea, unspecified            Surgical History:      Past Surgical History:  Procedure Laterality Date   CARDIAC DEFIBRILLATOR PLACEMENT       LUNG SURGERY       operation on skin of neck       ROTATOR CUFF REPAIR Left     TENDON REPAIR       VIDEO ASSISTED THORACOSCOPY              Home Medications:  Allergies as of 01/06/2023   No Known Allergies         Medication List           Accurate as of January 06, 2023  2:41 PM. If you have any questions, ask your nurse or doctor.              acetaminophen 325 MG tablet Commonly known as: TYLENOL Take 2 tablets (650 mg total) by mouth every 6 (six) hours as needed for mild pain (or Fever >/= 101).    albuterol 108 (90 Base) MCG/ACT inhaler Commonly known as: VENTOLIN HFA Inhale 2 puffs into the lungs every 4 (four) hours as needed for wheezing or shortness of breath.    atorvastatin 80 MG tablet Commonly known as: LIPITOR Take 40 mg by mouth daily.    Carboxymethylcellulose Sod PF 0.5 % Soln Place 1 drop into both eyes 2 (two) times daily. For dry eyes    cetirizine 10 MG tablet Commonly known as: ZYRTEC Take 10 mg by mouth at bedtime.    Cholecalciferol 50 MCG (2000 UT) Tabs Take 1 tablet by mouth every morning.    clotrimazole 1 % cream Commonly known as:  LOTRIMIN Apply 1 Application topically 2 (two) times daily. Apply a small amount to affected area twice daily for feet.    cyanocobalamin 500 MCG tablet Commonly known as: VITAMIN B12 Take 1 tablet by mouth daily.    diclofenac Sodium 1 % Gel Commonly known as: VOLTAREN Apply 2 g topically 4 (four) times daily as needed (pain).    Eliquis 5 MG Tabs tablet Generic drug: apixaban Take 5 mg by mouth 2 (two) times daily.    eucerin cream Apply 1 Application topically 2 (two) times daily. Apply a small amount to affected area twice daily    FLUoxetine 20 MG capsule Commonly known as: PROZAC Take 80 mg by mouth daily.    guaiFENesin-dextromethorphan 100-10 MG/5ML syrup Commonly known as: ROBITUSSIN DM Take 5 mLs by mouth every 4 (four) hours as needed for cough.    hydrALAZINE 50 MG tablet Commonly known as: APRESOLINE Take 50 mg by mouth 3 (  three) times daily.    KETOTIFEN FUMARATE OP Apply 1 drop to eye 2 (two) times daily as needed (allergies). 0.025%    latanoprost 0.005 % ophthalmic solution Commonly known as: XALATAN Place 1 drop into both eyes at bedtime.    lidocaine 5 % Commonly known as: LIDODERM Place 1 patch onto the skin daily. Apply 1 patch to skin once daily (Apply for  hours, then remove for  hours)    losartan 100 MG tablet Commonly known as: COZAAR Take 0.5 tablets (50 mg total) by mouth daily.    metoprolol succinate 50 MG 24 hr tablet Commonly known as: TOPROL-XL Take 0.5 tablets by mouth daily.    omeprazole 20 MG capsule Commonly known as: PRILOSEC Take 20 mg by mouth 2 (two) times daily before a meal. Take on an empty stomach 30 minutes prior to a meal    ondansetron 8 MG tablet Commonly known as: ZOFRAN Take 1 tablet (8 mg total) by mouth every 8 (eight) hours as needed for nausea or vomiting.    Oxycodone HCl 10 MG Tabs Take 1 tablet (10 mg total) by mouth every 4 (four) hours as needed for up to 15 days.    polyethylene glycol 17 g  packet Commonly known as: MIRALAX / GLYCOLAX Take 17 g by mouth 2 (two) times daily.    senna-docusate 8.6-50 MG tablet Commonly known as: Senokot-S Take 2 tablets by mouth at bedtime.    timolol 0.5 % ophthalmic gel-forming Commonly known as: TIMOPTIC-XR Apply 1 drop to eye daily.    torsemide 20 MG tablet Commonly known as: DEMADEX Take 1 tablet (20 mg total) by mouth 2 (two) times daily. For Fluid    traZODone 50 MG tablet Commonly known as: DESYREL Take 50 mg by mouth at bedtime.             Allergies:  Allergies  No Known Allergies     Family History: No family history on file.       Social History:  reports that he has quit smoking. He has never used smokeless tobacco. He reports that he does not drink alcohol and does not use drugs.   ROS: All other review of systems were reviewed and are negative except what is noted above in HPI   Physical Exam: BP (!) 168/85   Pulse 85   Constitutional:  Alert and oriented, No acute distress. HEENT: [place] AT, moist mucus membranes.  Trachea midline, no masses. Cardiovascular: No clubbing, cyanosis, or edema. Respiratory: Normal respiratory effort, no increased work of breathing. GI: Abdomen is soft, nontender, nondistended, no abdominal masses GU: No CVA tenderness.  Lymph: No cervical or inguinal lymphadenopathy. Skin: No rashes, bruises or suspicious lesions. Neurologic: Grossly intact, no focal deficits, moving all 4 extremities. Psychiatric: Normal mood and affect.   Laboratory Data: Recent Labs       Lab Results  Component Value Date    WBC 8.0 12/31/2022    HGB 7.4 (L) 12/31/2022    HCT 23.2 (L) 12/31/2022    MCV 94.7 12/31/2022    PLT 369 12/31/2022        Recent Labs       Lab Results  Component Value Date    CREATININE 1.72 (H) 12/31/2022        Recent Labs  No results found for: "PSA"     Recent Labs  No results found for: "TESTOSTERONE"     Recent Labs        Lab  Results  Component  Value Date    HGBA1C (H) 09/18/2010      6.2 (NOTE)                                                                       According to the ADA Clinical Practice Recommendations for 2011, when HbA1c is used as a screening test:   >=6.5%   Diagnostic of Diabetes Mellitus           (if abnormal result  is confirmed)  5.7-6.4%   Increased risk of developing Diabetes Mellitus  References:Diagnosis and Classification of Diabetes Mellitus,Diabetes Care,2011,34(Suppl 1):S62-S69 and Standards of Medical Care in         Diabetes - 2011,Diabetes Care,2011,34  (Suppl 1):S11-S61.        Urinalysis Labs (Brief)          Component Value Date/Time    COLORURINE YELLOW 12/30/2022 2054    APPEARANCEUR CLEAR 12/30/2022 2054    LABSPEC 1.015 12/30/2022 2054    PHURINE 5.0 12/30/2022 2054    GLUCOSEU NEGATIVE 12/30/2022 2054    HGBUR NEGATIVE 12/30/2022 2054    BILIRUBINUR NEGATIVE 12/30/2022 2054    KETONESUR NEGATIVE 12/30/2022 2054    PROTEINUR NEGATIVE 12/30/2022 2054    UROBILINOGEN 1.0 09/18/2010 1918    NITRITE NEGATIVE 12/30/2022 2054    LEUKOCYTESUR NEGATIVE 12/30/2022 2054        Recent Labs       Lab Results  Component Value Date    BACTERIA NONE SEEN 12/30/2022        Pertinent Imaging: Ct 12/13/2022: Images reviewed and discussed with the patient  No results found for this or any previous visit.   No results found for this or any previous visit.   No results found for this or any previous visit.   No results found for this or any previous visit.   Results for orders placed during the hospital encounter of 09/12/10   US Renal   Narrative *RADIOLOGY REPORT*   Clinical Data: Renal failure   RENAL/URINARY TRACT ULTRASOUND COMPLETE   Comparison:  Ultrasound of the abdomen of 01/22/2004   Findings:   Right Kidney:  The right kidney measures 8.6 cm sagittally.  No hydronephrosis is seen.  A discrepancy in length of greater than 2 cm suggests an element of atrophy.    Left Kidney:  No hydronephrosis and the left kidney measures 11.0 cm.   Bladder:  The urinary bladder is unremarkable.   IMPRESSION: No hydronephrosis.  There is discrepancy in renal size with the right kidney smaller than the left as noted above.   Original Report Authenticated By: Juline Patch, M.D.   No valid procedures specified. No results found for this or any previous visit.   No results found for this or any previous visit.     Assessment & Plan:     Bladder mass -We dsicussed the management of bladder masses including observation versus endoscopic resection and after discussing the options the patient elects for resection

## 2023-03-15 ENCOUNTER — Telehealth: Payer: Self-pay | Admitting: Urology

## 2023-03-15 LAB — SURGICAL PATHOLOGY

## 2023-03-15 NOTE — Telephone Encounter (Signed)
Returned call to wife and made her aware that patient may be having bladder spasms which is not uncommon. Wife also advised to increase patients fluid intake to help with flushing out his bladder.  Wife states urine is noted to be freely flowing into urinary bag with leakage/bladder spasms at times.  Wife informed that if patient is unable to pass urine or develops a fever to call office or go to ER or urgent care if office is closed.  Wife voiced understanding and will keep scheduled ov on Wednesday.

## 2023-03-15 NOTE — Telephone Encounter (Signed)
Patient wife called states that his catheter is leaking around it, and it is getting increasingly worse.  She states that the urine still has blood in it as well he had the procedure done last Wednesday and she is worried he may be getting an infection.  Please advise

## 2023-03-16 ENCOUNTER — Encounter (HOSPITAL_COMMUNITY): Payer: Self-pay | Admitting: Urology

## 2023-03-17 ENCOUNTER — Ambulatory Visit: Payer: No Typology Code available for payment source

## 2023-03-17 VITALS — BP 137/78 | HR 71

## 2023-03-17 DIAGNOSIS — N3289 Other specified disorders of bladder: Secondary | ICD-10-CM

## 2023-03-17 MED ORDER — CIPROFLOXACIN HCL 500 MG PO TABS
500.0000 mg | ORAL_TABLET | Freq: Once | ORAL | Status: AC
  Administered 2023-03-17: 500 mg via ORAL

## 2023-03-17 NOTE — Progress Notes (Signed)
Fill and Pull Catheter Removal  Patient is present today for a catheter removal.  Patient was cleaned and prepped in a sterile fashion of sterile water/ saline was instilled into the bladder when the patient felt the urge to urinate. 30ml of water was then drained from the balloon.  A 24FR foley cath was removed from the bladder no complications were noted .  Patient as then given some time to void on their own.  Patient can void  on their own after some time.  Patient tolerated well.  Performed by: Marchelle Folks RN  Follow up/ Additional notes: keep scheduled follow up with visit next week for post op

## 2023-03-24 ENCOUNTER — Ambulatory Visit: Payer: No Typology Code available for payment source | Admitting: Urology

## 2023-03-24 VITALS — BP 112/63 | HR 59

## 2023-03-24 DIAGNOSIS — N3289 Other specified disorders of bladder: Secondary | ICD-10-CM

## 2023-03-24 DIAGNOSIS — N1330 Unspecified hydronephrosis: Secondary | ICD-10-CM | POA: Diagnosis not present

## 2023-03-24 DIAGNOSIS — C61 Malignant neoplasm of prostate: Secondary | ICD-10-CM | POA: Diagnosis not present

## 2023-03-24 LAB — MICROSCOPIC EXAMINATION
RBC, Urine: >30 /[HPF] — AB (ref 0–2)
WBC, UA: >30 /[HPF] — AB (ref 0–5)

## 2023-03-24 LAB — URINALYSIS, ROUTINE W REFLEX MICROSCOPIC
Bilirubin, UA: NEGATIVE
Glucose, UA: NEGATIVE
Ketones, UA: NEGATIVE
Nitrite, UA: NEGATIVE
Specific Gravity, UA: 1.01 (ref 1.005–1.030)
Urobilinogen, Ur: 4 mg/dL — ABNORMAL HIGH (ref 0.2–1.0)
pH, UA: 6.5 (ref 5.0–7.5)

## 2023-03-24 NOTE — Progress Notes (Signed)
03/24/2023 12:11 PM   Alveria Apley 24-Aug-1945 [id number]  Referring provider: [facility], [facility] [address] [place],  [place] [address]  Followup bladder tumor resection   HPI: Mr Bolger is a 77yo here for followup after bladder tumor resection. Pathology adenocarcinoma of the prostate. He was started on firmagon 2 weeks ago at medical oncology at the [facility] in [place]. UA concerning for infection. He denies nay hematuria. He has a right ureteral stent in place   PMH: Past Medical History:  Diagnosis Date   Anemia    CVA (cerebral vascular accident) (HCC) 07/17/2013   Empyema (HCC)    Erectile dysfunction due to arterial insufficiency    Essential (primary) hypertension    Gastro-esophageal reflux disease with esophagitis    GERD (gastroesophageal reflux disease)    Head injury    Heart disease    Hyperlipidemia, unspecified    Hypertension    Obesity, unspecified    Pneumonia    Post-traumatic stress disorder, unspecified    Sleep apnea, unspecified     Surgical History: Past Surgical History:  Procedure Laterality Date   CARDIAC DEFIBRILLATOR PLACEMENT     CYSTOSCOPY WITH BIOPSY N/A 03/11/2023   Procedure: CYSTOSCOPY WITH BIOPSY;  Surgeon: Malen Gauze, MD;  Location: [facility];  Service: Urology;  Laterality: N/A;   CYSTOSCOPY WITH STENT PLACEMENT Right 03/11/2023   Procedure: CYSTOSCOPY WITH STENT PLACEMENT;  Surgeon: Malen Gauze, MD;  Location: [facility];  Service: Urology;  Laterality: Right;   LUNG SURGERY     VATS   operation on skin of neck     cyst removal   ROTATOR CUFF REPAIR Left    TENDON REPAIR     TRANSURETHRAL RESECTION OF BLADDER TUMOR N/A 03/11/2023   Procedure: TRANSURETHRAL RESECTION OF BLADDER TUMOR (TURBT);  Surgeon: Malen Gauze, MD;  Location: [facility];  Service: Urology;  Laterality: N/A;   VIDEO ASSISTED THORACOSCOPY      Home Medications:  Allergies as of 03/24/2023   No Known Allergies       Medication List        Accurate as of March 24, 2023 12:11 PM. If you have any questions, ask your nurse or doctor.          albuterol 108 (90 Base) MCG/ACT inhaler Commonly known as: VENTOLIN HFA Inhale 2 puffs into the lungs every 4 (four) hours as needed for wheezing or shortness of breath.   bisacodyl 5 MG EC tablet Commonly known as: DULCOLAX Take 10 mg by mouth daily as needed for moderate constipation.   bisoprolol 10 MG tablet Commonly known as: ZEBETA Take 1 tablet (10 mg total) by mouth daily.   Carboxymethylcellulose Sod PF 0.5 % Soln Place 1 drop into both eyes 2 (two) times daily. For dry eyes   clotrimazole 1 % cream Commonly known as: LOTRIMIN Apply 1 Application topically 2 (two) times daily. Apply a small amount to affected area twice daily for feet.   diclofenac Sodium 1 % Gel Commonly known as: VOLTAREN Apply 2 g topically 4 (four) times daily as needed (pain).   Eliquis 5 MG Tabs tablet Generic drug: apixaban Take 5 mg by mouth 2 (two) times daily.   feeding supplement Liqd Take 237 mLs by mouth 2 (two) times daily between meals.   FLUoxetine 20 MG capsule Commonly known as: PROZAC Take 80 mg by mouth daily.   HYDROcodone-acetaminophen 5-325 MG tablet Commonly known as: Norco Take 1 tablet by mouth every  6 (six) hours as needed for moderate pain (pain score 4-6).   KETOTIFEN FUMARATE OP Apply 1 drop to eye 2 (two) times daily as needed (allergies). 0.025%   latanoprost 0.005 % ophthalmic solution Commonly known as: XALATAN Place 1 drop into both eyes at bedtime.   lidocaine 5 % Commonly known as: LIDODERM Place 1 patch onto the skin daily. Apply 1 patch to skin once daily (Apply for  hours, then remove for  hours)   losartan 100 MG tablet Commonly known as: COZAAR Take 50 mg by mouth daily.   mirtazapine 15 MG tablet Commonly known as: REMERON Take 0.5 tablets (7.5 mg total) by mouth at bedtime. For loss of appetite    multivitamin with minerals Tabs tablet Take 1 tablet by mouth daily.   omeprazole 20 MG capsule Commonly known as: PRILOSEC Take 20 mg by mouth 2 (two) times daily before a meal. Take on an empty stomach 30 minutes prior to a meal   ondansetron 8 MG tablet Commonly known as: ZOFRAN Take 1 tablet (8 mg total) by mouth every 8 (eight) hours as needed for nausea or vomiting.   Oxycodone HCl 10 MG Tabs Take 10 mg by mouth every 4 (four) hours as needed (severe pain).   polyethylene glycol 17 g packet Commonly known as: MIRALAX / GLYCOLAX Take 17 g by mouth 2 (two) times daily.   potassium chloride 10 MEQ tablet Commonly known as: KLOR-CON M Take 1 tablet (10 mEq total) by mouth every other day.   timolol 0.5 % ophthalmic gel-forming Commonly known as: TIMOPTIC-XR Place 1 drop into both eyes daily.   Torsemide 40 MG Tabs Take 40 mg by mouth 2 (two) times daily.   traZODone 50 MG tablet Commonly known as: DESYREL Take 50 mg by mouth at bedtime.   vitamin D3 50 MCG (2000 UT) Caps Take 2,000 Units by mouth daily.   Xtampza ER 9 MG C12a Generic drug: oxyCODONE ER Take 9 mg by mouth every 12 (twelve) hours.        Allergies: No Known Allergies  Family History: No family history on file.  Social History:  reports that he has quit smoking. He has never used smokeless tobacco. He reports that he does not drink alcohol and does not use drugs.  ROS: All other review of systems were reviewed and are negative except what is noted above in HPI  Physical Exam: BP 112/63   Pulse (!) 59   Constitutional:  Alert and oriented, No acute distress. HEENT: [place] AT, moist mucus membranes.  Trachea midline, no masses. Cardiovascular: No clubbing, cyanosis, or edema. Respiratory: Normal respiratory effort, no increased work of breathing. GI: Abdomen is soft, nontender, nondistended, no abdominal masses GU: No CVA tenderness.  Lymph: No cervical or inguinal lymphadenopathy. Skin: No  rashes, bruises or suspicious lesions. Neurologic: Grossly intact, no focal deficits, moving all 4 extremities. Psychiatric: Normal mood and affect.  Laboratory Data: Lab Results  Component Value Date   WBC 6.0 02/25/2023   HGB 8.5 (L) 02/25/2023   HCT 27.9 (L) 02/25/2023   MCV 98.2 02/25/2023   PLT 375 02/25/2023    Lab Results  Component Value Date   CREATININE 1.33 (H) 02/26/2023    No results found for: "PSA"  No results found for: "TESTOSTERONE"  Lab Results  Component Value Date   HGBA1C (H) 09/18/2010    6.2 (NOTE)  According to the ADA Clinical Practice Recommendations for 2011, when HbA1c is used as a screening test:   >=6.5%   Diagnostic of Diabetes Mellitus           (if abnormal result  is confirmed)  5.7-6.4%   Increased risk of developing Diabetes Mellitus  References:Diagnosis and Classification of Diabetes Mellitus,Diabetes Care,2011,34(Suppl 1):S62-S69 and Standards of Medical Care in         Diabetes - 2011,Diabetes Care,2011,34  (Suppl 1):S11-S61.    Urinalysis    Component Value Date/Time   COLORURINE AMBER (A) 01/18/2023 0211   APPEARANCEUR HAZY (A) 01/18/2023 0211   LABSPEC 1.018 01/18/2023 0211   PHURINE 5.0 01/18/2023 0211   GLUCOSEU NEGATIVE 01/18/2023 0211   HGBUR NEGATIVE 01/18/2023 0211   BILIRUBINUR NEGATIVE 01/18/2023 0211   KETONESUR NEGATIVE 01/18/2023 0211   PROTEINUR NEGATIVE 01/18/2023 0211   UROBILINOGEN 1.0 09/18/2010 1918   NITRITE NEGATIVE 01/18/2023 0211   LEUKOCYTESUR NEGATIVE 01/18/2023 0211    Lab Results  Component Value Date   BACTERIA NONE SEEN 12/30/2022    Pertinent Imaging: *** No results found for this or any previous visit.  Results for orders placed during the hospital encounter of 01/17/23  US Venous Img Lower Bilateral (DVT)  Narrative CLINICAL DATA:  Dyspnea, short of breath, lower extremity edema  EXAM: BILATERAL LOWER EXTREMITY  VENOUS DOPPLER ULTRASOUND  TECHNIQUE: Gray-scale sonography with graded compression, as well as color Doppler and duplex ultrasound were performed to evaluate the lower extremity deep venous systems from the level of the common femoral vein and including the common femoral, femoral, profunda femoral, popliteal and calf veins including the posterior tibial, peroneal and gastrocnemius veins when visible. The superficial great saphenous vein was also interrogated. Spectral Doppler was utilized to evaluate flow at rest and with distal augmentation maneuvers in the common femoral, femoral and popliteal veins.  COMPARISON:  None Available.  FINDINGS: RIGHT LOWER EXTREMITY  Common Femoral Vein: No evidence of thrombus. Normal compressibility, respiratory phasicity and response to augmentation.  Saphenofemoral Junction: No evidence of thrombus. Normal compressibility and flow on color Doppler imaging.  Profunda Femoral Vein: No evidence of thrombus. Normal compressibility and flow on color Doppler imaging.  Femoral Vein: No evidence of thrombus. Normal compressibility, respiratory phasicity and response to augmentation.  Popliteal Vein: No evidence of thrombus. Normal compressibility, respiratory phasicity and response to augmentation.  Calf Veins: No evidence of thrombus. Normal compressibility and flow on color Doppler imaging.  Superficial Great Saphenous Vein: No evidence of thrombus. Normal compressibility.  Venous Reflux:  None.  Other Findings:  None.  LEFT LOWER EXTREMITY  Common Femoral Vein: No evidence of thrombus. Normal compressibility, respiratory phasicity and response to augmentation.  Saphenofemoral Junction: No evidence of thrombus. Normal compressibility and flow on color Doppler imaging.  Profunda Femoral Vein: No evidence of thrombus. Normal compressibility and flow on color Doppler imaging.  Femoral Vein: No evidence of thrombus. Normal  compressibility, respiratory phasicity and response to augmentation.  Popliteal Vein: No evidence of thrombus. Normal compressibility, respiratory phasicity and response to augmentation.  Calf Veins: No evidence of thrombus. Normal compressibility and flow on color Doppler imaging.  Superficial Great Saphenous Vein: No evidence of thrombus. Normal compressibility.  Venous Reflux:  None.  Other Findings:  None.  IMPRESSION: No evidence of deep venous thrombosis in either lower extremity.   Electronically Signed By: Malachy Moan M.D. On: 01/20/2023 13:29  No results found for this or any previous visit.  No results found for  this or any previous visit.  Results for orders placed during the hospital encounter of 09/12/10  US Renal  Narrative *RADIOLOGY REPORT*  Clinical Data: Renal failure  RENAL/URINARY TRACT ULTRASOUND COMPLETE  Comparison:  Ultrasound of the abdomen of 01/22/2004  Findings:  Right Kidney:  The right kidney measures 8.6 cm sagittally.  No hydronephrosis is seen.  A discrepancy in length of greater than 2 cm suggests an element of atrophy.  Left Kidney:  No hydronephrosis and the left kidney measures 11.0 cm.  Bladder:  The urinary bladder is unremarkable.  IMPRESSION: No hydronephrosis.  There is discrepancy in renal size with the right kidney smaller than the left as noted above.  Original Report Authenticated By: Juline Patch, M.D.  No valid procedures specified. No results found for this or any previous visit.  No results found for this or any previous visit.   Assessment & Plan:    1. Prostate cancer -Management per medical oncology at the [facility] - Urinalysis, Routine w reflex microscopic  2. Right hydronephrosis -followup 3 months with renal US and possible cystoscopy with stent removal   No follow-ups on file.  Wilkie Aye, MD  [facility] [place]

## 2023-03-26 LAB — URINE CULTURE

## 2023-03-28 ENCOUNTER — Encounter: Payer: Self-pay | Admitting: Urology

## 2023-03-28 NOTE — Patient Instructions (Signed)

## 2023-04-02 ENCOUNTER — Encounter (HOSPITAL_COMMUNITY): Payer: Self-pay

## 2023-04-02 ENCOUNTER — Ambulatory Visit (HOSPITAL_COMMUNITY): Payer: No Typology Code available for payment source | Attending: Urology

## 2023-04-09 ENCOUNTER — Ambulatory Visit (HOSPITAL_COMMUNITY)
Admission: RE | Admit: 2023-04-09 | Discharge: 2023-04-09 | Disposition: A | Discharge status: Home / Self Care | Payer: No Typology Code available for payment source | Source: Ambulatory Visit | Attending: Urology | Admitting: Urology

## 2023-04-09 DIAGNOSIS — C61 Malignant neoplasm of prostate: Secondary | ICD-10-CM | POA: Insufficient documentation

## 2023-04-12 ENCOUNTER — Telehealth: Payer: Self-pay | Admitting: Urology

## 2023-04-12 NOTE — Telephone Encounter (Signed)
Patient's wife states that patients urine is dark and has dysuria. Wife made aware that next available will be with provider on Wednesday and that patient should try to reach out to PCP or urgent care. Wife states that PCP is in [place] and they do not wish to go there. Wednesday appointment accepted. Wife informed if patient spikes a fever or if symptoms worsen to go to urgent care or ER. Wife voiced understanding and will call office to cancel Wednesday appointment if he goes to another location.

## 2023-04-12 NOTE — Telephone Encounter (Signed)
Problems with urine, wants someone to call back [phone]

## 2023-04-14 ENCOUNTER — Ambulatory Visit: Payer: No Typology Code available for payment source | Admitting: Urology

## 2023-04-23 NOTE — Progress Notes (Signed)
GU Location of Tumor / Histology: Prostate Ca  Biopsy   02/24/2023 Dr. Estanislado Pandy DG Chest 2 View CLINICAL DATA: Shortness of breath. History of prostate cancer.   IMPRESSION: Defibrillator.  Tiny pleural effusion versus pleural thickening. Multifocal sclerotic bone lesions consistent with known history of metastatic prostate cancer.  02/05/2023 Dr. Jeryl Columbia NM PET ([facility]) Skull to Mid Thigh CLINICAL DATA: High risk prostate carcinoma staging.   IMPRESSION: 1. Two foci of moderate radiotracer activity in the posterior LEFT and RIGHT lobe of the prostate gland consistent with primary prostate adenocarcinoma. 2. Larger soft tissue with intensely radiotracer avid lesion along the posterior margin of the bladder and RIGHT seminal vesicle consistent with local extension of prostate adenocarcinoma. 3. Intensely radiotracer avid metastatic adenopathy in the pelvis, retroperitoneum, mediastinum and LEFT supraclavicular nodal station. 4. 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 5.  Aortic Atherosclerosis (ICD10-I70.0).   Past/Anticipated interventions by urology, if any:   Past/Anticipated interventions by medical oncology, if any:   Weight changes, if any: {:18581}  IPSS: SHIM:  Bowel/Bladder complaints, if any: {:18581}   Nausea/Vomiting, if any: {:18581}  Pain issues, if any:  {:18581}  SAFETY ISSUES: Prior radiation? {:18581} Pacemaker/ICD? ICD  Possible current pregnancy?  Male Is the patient on methotrexate? No  Current Complaints / other details:

## 2023-04-25 ENCOUNTER — Encounter: Payer: Self-pay | Admitting: Urology

## 2023-04-25 DIAGNOSIS — C61 Malignant neoplasm of prostate: Secondary | ICD-10-CM | POA: Insufficient documentation

## 2023-04-25 NOTE — Progress Notes (Signed)
Radiation Oncology         [phone] ________________________________  Initial outpatient Consultation-CT SIMULATION SAME DAY  Name: Luis Donovan MRN: [id number]  Date of Service: 04/26/2023 DOB: Aug 18, 1945  WU:[id number], Caren Macadam, MD   REFERRING PHYSICIAN: Jeryl Columbia, MD  DIAGNOSIS: 77 y/o man with painful osseous metastases in the left hip/low back secondary to stage IV, Gleason 5+4, adenocarcinoma of the prostate with widespread metastatic disease at diagnosis.    ICD-10-CM   1. Malignant neoplasm of prostate metastatic to bone (HCC)  C61    C79.51     2. Prostate cancer metastatic to multiple sites [facility])  C61       HISTORY OF PRESENT ILLNESS: Luis Donovan is a 77 y.o. male seen at the request of his medical oncologist at the [facility], Dr. Manya Silvas. He initially presented to the ED on 12/13/22 with worsening back and chest pain. He underwent angio CT C/A/P at that time, which revealed mild right hydroureteronephrosis, which appears due to occlusion from an irregular mass involving the posterior urinary bladder in the region of the right ureterovesical junction in addition to extensive lymphadenopathy in the chest, abdomen and pelvis as well as diffuse sclerotic densities throughout the visualized skeleton. A PSA obtained in the hospital showed significant elevation at 33.36. He was referred to Dr. Ronne Binning in urology for further work up, but this was delayed due to patient having multiple hospitalizations secondary to COVID infection on 12/18/22 and acute renal failure on 12/30/22. When he was seen in consultation on 01/06/23, an in-office cystoscopy confirmed a 4 cm bladder neck lesion and an enlarged prostate. After discussion, the plan was to proceed with TURBT in the near future. He was also connected with medical oncology at both the [facility] and here at the [facility]. Unfortunately, the patient was again hospitalized on 01/17/23 with acute heart failure, AKI,  and transaminitis and on 02/24/23 with recurrent heart failure, thus again, delaying his work up.   He did undergo a staging PSMA PET scan on 02/05/23 showing two foci of moderate radiotracer activity in the posterior left and right lobe of the prostate gland with a larger intensely radiotracer-avid soft tissue lesion along the posterior margin of the bladder and right seminal vesicle, consistent with local extension of prostate adenocarcinoma. Additionally, there was intensely radiotracer-avid metastatic adenopathy in the pelvis, retroperitoneum, mediastinum, and left supraclavicular nodal stations and widespread intensely radiotracer-avid skeletal metastasis throughout the pelvis, spine, ribs, and shoulder girdles.  He was able to proceed with TURBT on 03/11/23 under the care of Dr. Ronne Binning and final surgical pathology confirmed high grade prostatic adenocarcinoma, Gleason score 5+4, involving 90% of the resected tissue. He was started on systemic treatment with Degarelix ADT and darolutamide on 03/17/23, by Dr. Manya Silvas, his medical oncologist at the [facility]. His PSA responded well, decreasing to 2.77 on 04/14/23. He received his second dose of degarelix on 04/14/23 and has noted significant improvement in his bony pain aside from persistent pain in the posteriolateral left hip.  The pain is occasionally 10/10 on the pain scale but does respond to the pain medication that he has. The pain does radiate into the left buttock and down the left leg to the knee but he denies any paraesthesias or focal weakness. His activities are limited due to pain so he has noted some deconditioning and is hoping to get back active once the left hip is treated and the pain better controlled. Depending on his performance  status, they are planning to start Docetaxel as well. He has a scheduled follow up with his oncologist at the [name] on Wednesday, 04/28/23 to further discuss additional treatment.   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 insufficiency    Essential (primary) hypertension    Gastro-esophageal reflux disease with esophagitis    GERD (gastroesophageal reflux disease)    Head injury    Heart disease    Hyperlipidemia, unspecified    Hypertension    Obesity, unspecified    Pneumonia    Post-traumatic stress disorder, unspecified    Sleep apnea, unspecified       PAST SURGICAL HISTORY: Past Surgical History:  Procedure Laterality Date   CARDIAC DEFIBRILLATOR PLACEMENT     CYSTOSCOPY WITH BIOPSY N/A 03/11/2023   Procedure: CYSTOSCOPY WITH BIOPSY;  Surgeon: Malen Gauze, MD;  Location: [facility];  Service: Urology;  Laterality: N/A;   CYSTOSCOPY WITH STENT PLACEMENT Right 03/11/2023   Procedure: CYSTOSCOPY WITH STENT PLACEMENT;  Surgeon: Malen Gauze, MD;  Location: [facility];  Service: Urology;  Laterality: Right;   LUNG SURGERY     VATS   operation on skin of neck     cyst removal   ROTATOR CUFF REPAIR Left    TENDON REPAIR     TRANSURETHRAL RESECTION OF BLADDER TUMOR N/A 03/11/2023   Procedure: TRANSURETHRAL RESECTION OF BLADDER TUMOR (TURBT);  Surgeon: Malen Gauze, MD;  Location: [facility];  Service: Urology;  Laterality: N/A;   VIDEO ASSISTED THORACOSCOPY      FAMILY HISTORY: History reviewed. No pertinent family history.  SOCIAL HISTORY:  Social History   Socioeconomic History   Marital status: Married    Spouse name: beverly   Number of children: 4   Years of education: Not on file   Highest education level: Not on file  Occupational History   Occupation: retired    Associate Professor: RETIRED  Tobacco Use   Smoking status: Former   Smokeless tobacco: Never  Advertising account planner   Vaping status: Never Used  Substance and Sexual Activity   Alcohol use: No   Drug use: No   Sexual activity: Never     Birth control/protection: None  Other Topics Concern   Not on file  Social History Narrative   Not on file   Social Determinants of Health   Financial Resource Strain: Not on file  Food Insecurity: No Food Insecurity (04/26/2023)   Hunger Vital Sign    Worried About Running Out of Food in the Last Year: Never true    Ran Out of Food in the Last Year: Never true  Transportation Needs: No Transportation Needs (04/26/2023)   PRAPARE - Administrator, Civil Service (Medical): No    Lack of Transportation (Non-Medical): No  Physical Activity: Not on file  Stress: Not on file  Social Connections: Not on file  Intimate Partner Violence: Not At Risk (04/26/2023)   Humiliation, Afraid, Rape, and Kick questionnaire    Fear of Current or Ex-Partner: No    Emotionally Abused: No    Physically Abused: No    Sexually Abused: No    ALLERGIES: Patient has no known allergies.  MEDICATIONS:  Current Outpatient Medications  Medication Sig Dispense Refill   amiodarone (PACERONE) 200 MG tablet Take by mouth.  darolutamide (NUBEQA) 300 MG tablet Take by mouth.     rosuvastatin (CRESTOR) 10 MG tablet Take 0.5 tablets by mouth daily.     torsemide (DEMADEX) 20 MG tablet Take by mouth. Takes 20 mg in morning and 40 mg in evenings.     albuterol (VENTOLIN HFA) 108 (90 Base) MCG/ACT inhaler Inhale 2 puffs into the lungs every 4 (four) hours as needed for wheezing or shortness of breath.     apixaban (ELIQUIS) 5 MG TABS tablet Take 5 mg by mouth 2 (two) times daily.     bisacodyl (DULCOLAX) 5 MG EC tablet Take 10 mg by mouth daily as needed for moderate constipation.     bisoprolol (ZEBETA) 10 MG tablet Take 1 tablet (10 mg total) by mouth daily. 30 tablet 2   Carboxymethylcellulose Sod PF 0.5 % SOLN Place 1 drop into both eyes 2 (two) times daily. For dry eyes     Cholecalciferol (VITAMIN D3) 50 MCG (2000 UT) CAPS Take 2,000 Units by mouth daily.     clotrimazole (LOTRIMIN) 1 % cream Apply  1 Application topically 2 (two) times daily. Apply a small amount to affected area twice daily for feet.     diclofenac Sodium (VOLTAREN) 1 % GEL Apply 2 g topically 4 (four) times daily as needed (pain).     feeding supplement (ENSURE ENLIVE / ENSURE PLUS) LIQD Take 237 mLs by mouth 2 (two) times daily between meals.     FLUoxetine (PROZAC) 20 MG capsule Take 80 mg by mouth daily.     HYDROcodone-acetaminophen (NORCO) 5-325 MG tablet Take 1 tablet by mouth every 6 (six) hours as needed for moderate pain (pain score 4-6). 30 tablet 0   KETOTIFEN FUMARATE OP Apply 1 drop to eye 2 (two) times daily as needed (allergies). 0.025%     latanoprost (XALATAN) 0.005 % ophthalmic solution Place 1 drop into both eyes at bedtime.     lidocaine (LIDODERM) 5 % Place 1 patch onto the skin daily. Apply 1 patch to skin once daily (Apply for  hours, then remove for  hours)     losartan (COZAAR) 100 MG tablet Take 50 mg by mouth daily.     mirtazapine (REMERON) 15 MG tablet Take 0.5 tablets (7.5 mg total) by mouth at bedtime. For loss of appetite     Multiple Vitamin (MULTIVITAMIN WITH MINERALS) TABS tablet Take 1 tablet by mouth daily.     omeprazole (PRILOSEC) 20 MG capsule Take 20 mg by mouth 2 (two) times daily before a meal. Take on an empty stomach 30 minutes prior to a meal     ondansetron (ZOFRAN) 8 MG tablet Take 1 tablet (8 mg total) by mouth every 8 (eight) hours as needed for nausea or vomiting. 30 tablet 2   oxyCODONE ER (XTAMPZA ER) 9 MG C12A Take 9 mg by mouth every 12 (twelve) hours. 30 capsule 0   Oxycodone HCl 10 MG TABS Take 10 mg by mouth every 4 (four) hours as needed (severe pain).     polyethylene glycol (MIRALAX / GLYCOLAX) 17 g packet Take 17 g by mouth 2 (two) times daily. 14 each 0   potassium chloride (KLOR-CON M) 10 MEQ tablet Take 1 tablet (10 mEq total) by mouth every other day. 15 tablet 1   timolol (TIMOPTIC-XR) 0.5 % ophthalmic gel-forming Place 1 drop into both eyes daily.      traZODone (DESYREL) 50 MG tablet Take 50 mg by mouth at bedtime.  No current facility-administered medications for this encounter.    REVIEW OF SYSTEMS:  On review of systems, the patient reports that he is doing well overall. He currently denies any chest pain, increased shortness of breath, productive cough, fevers, chills, night sweats, or recent unintended weight changes. He denies any bowel or bladder disturbances, and denies abdominal pain, nausea or vomiting. As above in the HPI, he reports left posterior-lateral left hip pain but no other sites of focal pain and no associated neurologic symptoms. A complete review of systems is obtained and is otherwise negative.    PHYSICAL EXAM:  Wt Readings from Last 3 Encounters:  04/26/23 201 lb 4 oz (91.3 kg)  03/11/23 203 lb 14.8 oz (92.5 kg)  03/08/23 204 lb (92.5 kg)   Temp Readings from Last 3 Encounters:  04/26/23 (!) 97.1 F (36.2 C) (Temporal)  03/11/23 97.7 F (36.5 C) (Oral)  02/26/23 98.3 F (36.8 C) (Oral)   BP Readings from Last 3 Encounters:  04/26/23 127/65  03/24/23 112/63  03/17/23 137/78   Pulse Readings from Last 3 Encounters:  04/26/23 (!) 54  03/24/23 (!) 59  03/17/23 71   Pain Assessment Pain Score: 8  Pain Loc: Hip (left)/10  In general this is a well appearing African American man in no acute distress. He's alert and oriented x4 and appropriate throughout the examination. Cardiopulmonary assessment is negative for acute distress and he exhibits normal effort.     KPS = 60  100 - Normal; no complaints; no evidence of disease. 90   - Able to carry on normal activity; minor signs or symptoms of disease. 80   - Normal activity with effort; some signs or symptoms of disease. 66   - Cares for self; unable to carry on normal activity or to do active work. 60   - Requires occasional assistance, but is able to care for most of his personal needs. 50   - Requires considerable assistance and frequent medical  care. 40   - Disabled; requires special care and assistance. 30   - Severely disabled; hospital admission is indicated although death not imminent. 20   - Very sick; hospital admission necessary; active supportive treatment necessary. 10   - Moribund; fatal processes progressing rapidly. 0     - Dead  Karnofsky DA, Abelmann WH, Craver LS and Burchenal [facility] [phone]) The use of the nitrogen mustards in the palliative treatment of carcinoma: with particular reference to bronchogenic carcinoma Cancer 1 634-56  LABORATORY DATA:  Lab Results  Component Value Date   WBC 6.0 02/25/2023   HGB 8.5 (L) 02/25/2023   HCT 27.9 (L) 02/25/2023   MCV 98.2 02/25/2023   PLT 375 02/25/2023   Lab Results  Component Value Date   NA 134 (L) 02/26/2023   K 3.7 02/26/2023   CL 97 (L) 02/26/2023   CO2 27 02/26/2023   Lab Results  Component Value Date   ALT 133 (H) 01/27/2023   AST 43 (H) 01/27/2023   ALKPHOS 624 (H) 01/27/2023   BILITOT 0.8 01/27/2023     RADIOGRAPHY: Ultrasound renal complete  Result Date: 04/09/2023 CLINICAL DATA:  Right hydronephrosis EXAM: RENAL / URINARY TRACT ULTRASOUND COMPLETE COMPARISON:  PET-CT 02/05/2023 FINDINGS: Right Kidney: Renal measurements: 9.7 x 5.7 x 5.7 cm = volume: 167.7 mL. Echogenicity within normal limits. No mass or hydronephrosis visualized. Left Kidney: Renal measurements: 10.7 x 6.5 x 5.9 cm = volume: 211.4 mL. Echogenicity within normal limits. No mass or hydronephrosis visualized. Bladder:  Appears normal for degree of bladder distention. Other: None. IMPRESSION: No hydronephrosis. Electronically Signed   By: Annia Belt M.D.   On: 04/09/2023 15:36      IMPRESSION/PLAN: 1. 77 y.o. man with painful osseous metastases in the left hip/low back secondary to stage IV, Gleason 5+4, adenocarcinoma of the prostate with widespread metastatic disease at diagnosis.  Today, we talked to the patient and and his wife about the findings and workup thus far. We discussed the  natural history of metastatic prostate carcinoma and general treatment, highlighting the role of palliative radiotherapy in the management of painful metastases. We discussed the available radiation techniques, and focused on the details and logistics of delivery. The recommendation is for a 2 week course of daily, palliative radiation to the left hip/pelvis. We reviewed the anticipated acute and late sequelae associated with radiation in this setting. The patient was encouraged to ask questions that were answered to his stated satisfaction and he is comfortable and in agreement with the plan.  He has freely signed written consent to proceed today in the office and a copy of this document will be placed in his medical record.  He will proceed with CT simulation/treatment planning following our visit this morning, in anticipation of beginning his daily radiation treatment later this week.  We will share our discussion with his team at the [facility] to keep everyone in the loop prior to his scheduled follow-up visit with Dr. Manya Silvas on Wednesday, 04/28/2023.  We enjoyed meeting him and his wife today and look forward to continuing to participate in his care.  We personally spent 70 minutes in this encounter including chart review, reviewing radiological studies, meeting face-to-face with the patient, entering orders, coordinating care and completing documentation.    Marguarite Arbour, PA-C    Margaretmary Dys, MD  [facility]  Radiation Oncology Direct Dial: [phone]  Fax: [phone] [email or web address]  Skype  [email or web address]   This document serves as a record of services personally performed by Margaretmary Dys, MD and Marcello Fennel, PA-C. It was created on their behalf by Mickie Bail, a trained medical scribe. The creation of this record is based on the scribe's personal observations and the provider's statements to them. This document has been checked and approved by the attending provider.

## 2023-04-26 ENCOUNTER — Ambulatory Visit
Admission: RE | Admit: 2023-04-26 | Discharge: 2023-04-26 | Disposition: A | Discharge status: Home / Self Care | Payer: No Typology Code available for payment source | Source: Ambulatory Visit | Attending: Radiation Oncology | Admitting: Radiation Oncology

## 2023-04-26 ENCOUNTER — Encounter: Payer: Self-pay | Admitting: Radiation Oncology

## 2023-04-26 VITALS — BP 127/65 | HR 54 | Temp 97.1°F | Resp 18 | Ht 69.0 in | Wt 201.2 lb

## 2023-04-26 DIAGNOSIS — N1330 Unspecified hydronephrosis: Secondary | ICD-10-CM | POA: Diagnosis not present

## 2023-04-26 DIAGNOSIS — Z7901 Long term (current) use of anticoagulants: Secondary | ICD-10-CM | POA: Insufficient documentation

## 2023-04-26 DIAGNOSIS — C7951 Secondary malignant neoplasm of bone: Secondary | ICD-10-CM | POA: Insufficient documentation

## 2023-04-26 DIAGNOSIS — Z79899 Other long term (current) drug therapy: Secondary | ICD-10-CM | POA: Insufficient documentation

## 2023-04-26 DIAGNOSIS — Z8673 Personal history of transient ischemic attack (TIA), and cerebral infarction without residual deficits: Secondary | ICD-10-CM | POA: Diagnosis not present

## 2023-04-26 DIAGNOSIS — E785 Hyperlipidemia, unspecified: Secondary | ICD-10-CM | POA: Diagnosis not present

## 2023-04-26 DIAGNOSIS — Z510 Encounter for antineoplastic radiation therapy: Secondary | ICD-10-CM | POA: Diagnosis present

## 2023-04-26 DIAGNOSIS — C775 Secondary and unspecified malignant neoplasm of intrapelvic lymph nodes: Secondary | ICD-10-CM | POA: Insufficient documentation

## 2023-04-26 DIAGNOSIS — C61 Malignant neoplasm of prostate: Secondary | ICD-10-CM | POA: Insufficient documentation

## 2023-04-26 DIAGNOSIS — K219 Gastro-esophageal reflux disease without esophagitis: Secondary | ICD-10-CM | POA: Diagnosis not present

## 2023-04-26 DIAGNOSIS — I110 Hypertensive heart disease with heart failure: Secondary | ICD-10-CM | POA: Diagnosis not present

## 2023-04-26 DIAGNOSIS — F4310 Post-traumatic stress disorder, unspecified: Secondary | ICD-10-CM | POA: Diagnosis not present

## 2023-04-26 DIAGNOSIS — N3289 Other specified disorders of bladder: Secondary | ICD-10-CM | POA: Diagnosis not present

## 2023-04-26 DIAGNOSIS — G4730 Sleep apnea, unspecified: Secondary | ICD-10-CM | POA: Diagnosis not present

## 2023-04-26 DIAGNOSIS — N401 Enlarged prostate with lower urinary tract symptoms: Secondary | ICD-10-CM | POA: Diagnosis not present

## 2023-04-26 DIAGNOSIS — R338 Other retention of urine: Secondary | ICD-10-CM | POA: Insufficient documentation

## 2023-04-26 NOTE — Progress Notes (Signed)
  Radiation Oncology         [phone] ________________________________  Name: Luis Donovan MRN: [id number]  Date: 04/26/2023  DOB: 02-Feb-1946  SIMULATION AND TREATMENT PLANNING NOTE    ICD-10-CM   1. Malignant neoplasm of prostate metastatic to bone (HCC)  C61    C79.51       DIAGNOSIS:  77 y/o man with painful osseous metastases in the left hip/low back secondary to stage IV, Gleason 5+4, adenocarcinoma of the prostate with widespread metastatic disease at diagnosis.   NARRATIVE:  The patient was brought to the CT Simulation planning suite.  Identity was confirmed.  All relevant records and images related to the planned course of therapy were reviewed.  The patient freely provided informed written consent to proceed with treatment after reviewing the details related to the planned course of therapy. The consent form was witnessed and verified by the simulation staff.  Then, the patient was set-up in a stable reproducible  supine position for radiation therapy.  CT images were obtained.  Surface markings were placed.  The CT images were loaded into the planning software.  Then the target and avoidance structures were contoured.  Treatment planning then occurred.  The radiation prescription was entered and confirmed.  Then, I designed and supervised the construction of a total of 3 medically necessary complex treatment devices consisting of leg positioner and MLC apertures to cover the treated hip area.  I have requested : 3D Simulation  I have requested a DVH of the following structures: Rectum, Bladder, femoral heads and target.  PLAN:  The patient will receive 30 Gy in 10 fractions.  ________________________________  Artist Pais Kathrynn Running, M.D.

## 2023-04-27 DIAGNOSIS — Z510 Encounter for antineoplastic radiation therapy: Secondary | ICD-10-CM | POA: Diagnosis not present

## 2023-04-29 ENCOUNTER — Ambulatory Visit: Payer: No Typology Code available for payment source

## 2023-04-30 ENCOUNTER — Ambulatory Visit: Payer: No Typology Code available for payment source

## 2023-05-03 ENCOUNTER — Ambulatory Visit: Payer: No Typology Code available for payment source

## 2023-05-04 ENCOUNTER — Other Ambulatory Visit: Payer: Self-pay

## 2023-05-04 ENCOUNTER — Ambulatory Visit
Admission: RE | Admit: 2023-05-04 | Discharge: 2023-05-04 | Discharge status: Home / Self Care | Payer: No Typology Code available for payment source | Source: Ambulatory Visit | Attending: Radiation Oncology

## 2023-05-04 DIAGNOSIS — Z510 Encounter for antineoplastic radiation therapy: Secondary | ICD-10-CM | POA: Diagnosis not present

## 2023-05-04 LAB — RAD ONC ARIA SESSION SUMMARY
Course Elapsed Days: 0
Plan Fractions Treated to Date: 1
Plan Prescribed Dose Per Fraction: 3 Gy
Plan Total Fractions Prescribed: 10
Plan Total Prescribed Dose: 30 Gy
Reference Point Dosage Given to Date: 3 Gy
Reference Point Session Dosage Given: 3 Gy
Session Number: 1

## 2023-05-05 ENCOUNTER — Other Ambulatory Visit: Payer: Self-pay

## 2023-05-05 ENCOUNTER — Ambulatory Visit
Admission: RE | Admit: 2023-05-05 | Discharge: 2023-05-05 | Disposition: A | Discharge status: Home / Self Care | Payer: No Typology Code available for payment source | Source: Ambulatory Visit | Attending: Radiation Oncology | Admitting: Radiation Oncology

## 2023-05-05 DIAGNOSIS — Z510 Encounter for antineoplastic radiation therapy: Secondary | ICD-10-CM | POA: Diagnosis not present

## 2023-05-05 LAB — RAD ONC ARIA SESSION SUMMARY
Course Elapsed Days: 1
Plan Fractions Treated to Date: 2
Plan Prescribed Dose Per Fraction: 3 Gy
Plan Total Fractions Prescribed: 10
Plan Total Prescribed Dose: 30 Gy
Reference Point Dosage Given to Date: 6 Gy
Reference Point Session Dosage Given: 3 Gy
Session Number: 2

## 2023-05-06 ENCOUNTER — Ambulatory Visit
Admission: RE | Admit: 2023-05-06 | Discharge: 2023-05-06 | Disposition: A | Discharge status: Home / Self Care | Payer: No Typology Code available for payment source | Source: Ambulatory Visit | Attending: Radiation Oncology | Admitting: Radiation Oncology

## 2023-05-06 ENCOUNTER — Other Ambulatory Visit: Payer: Self-pay

## 2023-05-06 DIAGNOSIS — Z510 Encounter for antineoplastic radiation therapy: Secondary | ICD-10-CM | POA: Diagnosis not present

## 2023-05-06 LAB — RAD ONC ARIA SESSION SUMMARY
Course Elapsed Days: 2
Plan Fractions Treated to Date: 3
Plan Prescribed Dose Per Fraction: 3 Gy
Plan Total Fractions Prescribed: 10
Plan Total Prescribed Dose: 30 Gy
Reference Point Dosage Given to Date: 9 Gy
Reference Point Session Dosage Given: 3 Gy
Session Number: 3

## 2023-05-07 ENCOUNTER — Ambulatory Visit
Admission: RE | Admit: 2023-05-07 | Discharge: 2023-05-07 | Disposition: A | Discharge status: Home / Self Care | Payer: No Typology Code available for payment source | Source: Ambulatory Visit | Attending: Radiation Oncology | Admitting: Radiation Oncology

## 2023-05-07 ENCOUNTER — Other Ambulatory Visit: Payer: Self-pay

## 2023-05-07 DIAGNOSIS — Z510 Encounter for antineoplastic radiation therapy: Secondary | ICD-10-CM | POA: Diagnosis not present

## 2023-05-07 LAB — RAD ONC ARIA SESSION SUMMARY
Course Elapsed Days: 3
Plan Fractions Treated to Date: 4
Plan Prescribed Dose Per Fraction: 3 Gy
Plan Total Fractions Prescribed: 10
Plan Total Prescribed Dose: 30 Gy
Reference Point Dosage Given to Date: 12 Gy
Reference Point Session Dosage Given: 3 Gy
Session Number: 4

## 2023-05-10 ENCOUNTER — Ambulatory Visit
Admission: RE | Admit: 2023-05-10 | Discharge: 2023-05-10 | Disposition: A | Discharge status: Home / Self Care | Payer: No Typology Code available for payment source | Source: Ambulatory Visit | Attending: Radiation Oncology | Admitting: Radiation Oncology

## 2023-05-10 ENCOUNTER — Other Ambulatory Visit: Payer: Self-pay

## 2023-05-10 DIAGNOSIS — Z510 Encounter for antineoplastic radiation therapy: Secondary | ICD-10-CM | POA: Diagnosis not present

## 2023-05-10 LAB — RAD ONC ARIA SESSION SUMMARY
Course Elapsed Days: 6
Plan Fractions Treated to Date: 5
Plan Prescribed Dose Per Fraction: 3 Gy
Plan Total Fractions Prescribed: 10
Plan Total Prescribed Dose: 30 Gy
Reference Point Dosage Given to Date: 15 Gy
Reference Point Session Dosage Given: 3 Gy
Session Number: 5

## 2023-05-11 ENCOUNTER — Ambulatory Visit
Admission: RE | Admit: 2023-05-11 | Discharge: 2023-05-11 | Disposition: A | Discharge status: Home / Self Care | Payer: No Typology Code available for payment source | Source: Ambulatory Visit | Attending: Radiation Oncology | Admitting: Radiation Oncology

## 2023-05-11 ENCOUNTER — Other Ambulatory Visit: Payer: Self-pay

## 2023-05-11 DIAGNOSIS — Z510 Encounter for antineoplastic radiation therapy: Secondary | ICD-10-CM | POA: Diagnosis not present

## 2023-05-11 LAB — RAD ONC ARIA SESSION SUMMARY
Course Elapsed Days: 7
Plan Fractions Treated to Date: 6
Plan Prescribed Dose Per Fraction: 3 Gy
Plan Total Fractions Prescribed: 10
Plan Total Prescribed Dose: 30 Gy
Reference Point Dosage Given to Date: 18 Gy
Reference Point Session Dosage Given: 3 Gy
Session Number: 6

## 2023-05-13 ENCOUNTER — Ambulatory Visit: Payer: No Typology Code available for payment source

## 2023-05-13 ENCOUNTER — Ambulatory Visit
Admission: RE | Admit: 2023-05-13 | Discharge: 2023-05-13 | Disposition: A | Discharge status: Home / Self Care | Payer: No Typology Code available for payment source | Source: Ambulatory Visit | Attending: Radiation Oncology | Admitting: Radiation Oncology

## 2023-05-13 ENCOUNTER — Other Ambulatory Visit: Payer: Self-pay

## 2023-05-13 DIAGNOSIS — Z510 Encounter for antineoplastic radiation therapy: Secondary | ICD-10-CM | POA: Diagnosis not present

## 2023-05-13 LAB — RAD ONC ARIA SESSION SUMMARY
Course Elapsed Days: 9
Plan Fractions Treated to Date: 7
Plan Prescribed Dose Per Fraction: 3 Gy
Plan Total Fractions Prescribed: 10
Plan Total Prescribed Dose: 30 Gy
Reference Point Dosage Given to Date: 21 Gy
Reference Point Session Dosage Given: 3 Gy
Session Number: 7

## 2023-05-14 ENCOUNTER — Ambulatory Visit
Admission: RE | Admit: 2023-05-14 | Discharge: 2023-05-14 | Disposition: A | Discharge status: Home / Self Care | Payer: No Typology Code available for payment source | Source: Ambulatory Visit | Attending: Radiation Oncology | Admitting: Radiation Oncology

## 2023-05-14 ENCOUNTER — Other Ambulatory Visit: Payer: Self-pay

## 2023-05-14 DIAGNOSIS — Z510 Encounter for antineoplastic radiation therapy: Secondary | ICD-10-CM | POA: Diagnosis not present

## 2023-05-14 LAB — RAD ONC ARIA SESSION SUMMARY
Course Elapsed Days: 10
Plan Fractions Treated to Date: 8
Plan Prescribed Dose Per Fraction: 3 Gy
Plan Total Fractions Prescribed: 10
Plan Total Prescribed Dose: 30 Gy
Reference Point Dosage Given to Date: 24 Gy
Reference Point Session Dosage Given: 3 Gy
Session Number: 8

## 2023-05-17 ENCOUNTER — Ambulatory Visit
Admission: RE | Admit: 2023-05-17 | Discharge: 2023-05-17 | Disposition: A | Discharge status: Home / Self Care | Payer: No Typology Code available for payment source | Source: Ambulatory Visit | Attending: Radiation Oncology | Admitting: Radiation Oncology

## 2023-05-17 ENCOUNTER — Other Ambulatory Visit: Payer: Self-pay

## 2023-05-17 ENCOUNTER — Ambulatory Visit: Payer: No Typology Code available for payment source

## 2023-05-17 DIAGNOSIS — Z510 Encounter for antineoplastic radiation therapy: Secondary | ICD-10-CM | POA: Diagnosis not present

## 2023-05-17 LAB — RAD ONC ARIA SESSION SUMMARY
Course Elapsed Days: 13
Plan Fractions Treated to Date: 9
Plan Prescribed Dose Per Fraction: 3 Gy
Plan Total Fractions Prescribed: 10
Plan Total Prescribed Dose: 30 Gy
Reference Point Dosage Given to Date: 27 Gy
Reference Point Session Dosage Given: 3 Gy
Session Number: 9

## 2023-05-18 ENCOUNTER — Other Ambulatory Visit: Payer: Self-pay

## 2023-05-18 ENCOUNTER — Ambulatory Visit
Admission: RE | Admit: 2023-05-18 | Discharge: 2023-05-18 | Disposition: A | Discharge status: Home / Self Care | Payer: No Typology Code available for payment source | Source: Ambulatory Visit | Attending: Radiation Oncology

## 2023-05-18 DIAGNOSIS — Z510 Encounter for antineoplastic radiation therapy: Secondary | ICD-10-CM | POA: Diagnosis not present

## 2023-05-18 LAB — RAD ONC ARIA SESSION SUMMARY
Course Elapsed Days: 14
Plan Fractions Treated to Date: 10
Plan Prescribed Dose Per Fraction: 3 Gy
Plan Total Fractions Prescribed: 10
Plan Total Prescribed Dose: 30 Gy
Reference Point Dosage Given to Date: 30 Gy
Reference Point Session Dosage Given: 3 Gy
Session Number: 10

## 2023-05-20 NOTE — Radiation Completion Notes (Signed)
 Patient Name: LUCKY, TROTTA MRN: [id number] Date of Birth: Feb 25, 1946 Referring Physician: BONNI BARRE, M.D. Date of Service: 2023-05-20 Radiation Oncologist: Adina Barge, M.D. [facility]                             RADIATION ONCOLOGY END OF TREATMENT NOTE     Diagnosis: C79.51 Secondary malignant neoplasm of bone Intent: Palliative     ==========DELIVERED PLANS==========  First Treatment Date: 2023-05-04 Last Treatment Date: 2023-05-18   Plan Name: Pelvis_L Site: Hip, Left Technique: 3D Mode: Photon Dose Per Fraction: 3 Gy Prescribed Dose (Delivered / Prescribed): 30 Gy / 30 Gy Prescribed Fxs (Delivered / Prescribed): 10 / 10     ==========ON TREATMENT VISIT DATES========== 2023-05-07, 2023-05-14     ==========UPCOMING VISITS========== 06/25/2023 [facility] [place] OFFICE VISIT Sherrilee Belvie CROME, MD  06/15/2023 [facility]-RADIATION ONC POST TREATMENT CALL [facility]-POST TREATMENT        ==========APPENDIX - ON TREATMENT VISIT NOTES==========   See weekly On Treatment Notes in Epic for details in the Media tab (listed as Progress notes on the On Treatment Visit Dates listed above).

## 2023-05-26 DIAGNOSIS — I10 Essential (primary) hypertension: Secondary | ICD-10-CM | POA: Diagnosis not present

## 2023-05-26 DIAGNOSIS — R7989 Other specified abnormal findings of blood chemistry: Secondary | ICD-10-CM | POA: Diagnosis not present

## 2023-05-26 DIAGNOSIS — Z7960 Long term (current) use of unspecified immunomodulators and immunosuppressants: Secondary | ICD-10-CM | POA: Diagnosis not present

## 2023-05-26 DIAGNOSIS — C61 Malignant neoplasm of prostate: Secondary | ICD-10-CM | POA: Diagnosis not present

## 2023-05-26 DIAGNOSIS — R079 Chest pain, unspecified: Secondary | ICD-10-CM | POA: Diagnosis not present

## 2023-05-26 DIAGNOSIS — R55 Syncope and collapse: Secondary | ICD-10-CM | POA: Diagnosis not present

## 2023-05-26 DIAGNOSIS — Z950 Presence of cardiac pacemaker: Secondary | ICD-10-CM | POA: Diagnosis not present

## 2023-05-26 DIAGNOSIS — I517 Cardiomegaly: Secondary | ICD-10-CM | POA: Diagnosis not present

## 2023-06-15 ENCOUNTER — Ambulatory Visit
Admission: RE | Admit: 2023-06-15 | Discharge: 2023-06-15 | Disposition: A | Discharge status: Home / Self Care | Payer: No Typology Code available for payment source | Source: Ambulatory Visit | Attending: Radiation Oncology | Admitting: Radiation Oncology

## 2023-06-15 NOTE — Progress Notes (Signed)
  Radiation Oncology         [phone] ________________________________  Name: Luis Donovan MRN: [id number]  Date of Service: 06/15/2023  DOB: 08-03-1945  Post Treatment Telephone Note  Diagnosis:  C79.51 Secondary malignant neoplasm of bone (as documented in provider EOT note)  The patient was not available for call today. Voicemail left.  The patient is scheduled for ongoing care with Dr. Ronne Binning in medical oncology. The patient was encouraged to call if he  develops concerns or questions regarding radiation.    Ruel Favors, LPN

## 2023-06-22 ENCOUNTER — Other Ambulatory Visit (HOSPITAL_COMMUNITY): Payer: Self-pay | Admitting: Student in an Organized Health Care Education/Training Program

## 2023-06-22 DIAGNOSIS — Z8546 Personal history of malignant neoplasm of prostate: Secondary | ICD-10-CM

## 2023-06-22 DIAGNOSIS — M25552 Pain in left hip: Secondary | ICD-10-CM

## 2023-06-25 ENCOUNTER — Other Ambulatory Visit: Payer: Self-pay

## 2023-06-25 ENCOUNTER — Ambulatory Visit: Payer: No Typology Code available for payment source | Admitting: Urology

## 2023-06-25 DIAGNOSIS — N1330 Unspecified hydronephrosis: Secondary | ICD-10-CM

## 2023-06-29 ENCOUNTER — Ambulatory Visit
Admission: EM | Admit: 2023-06-29 | Discharge: 2023-06-29 | Disposition: A | Discharge status: Home / Self Care | Payer: No Typology Code available for payment source | Attending: Nurse Practitioner | Admitting: Nurse Practitioner

## 2023-06-29 DIAGNOSIS — B349 Viral infection, unspecified: Secondary | ICD-10-CM

## 2023-06-29 DIAGNOSIS — R6889 Other general symptoms and signs: Secondary | ICD-10-CM

## 2023-06-29 LAB — POC COVID19/FLU A&B COMBO
Covid Antigen, POC: NEGATIVE
Influenza A Antigen, POC: NEGATIVE
Influenza B Antigen, POC: NEGATIVE

## 2023-06-29 MED ORDER — FLUTICASONE PROPIONATE 50 MCG/ACT NA SUSP: 2.0000 | Freq: Every day | NASAL | 0 refills | Status: AC

## 2023-06-29 MED ORDER — CETIRIZINE HCL 10 MG PO TABS
10.0000 mg | ORAL_TABLET | Freq: Every day | ORAL | 0 refills | Status: DC
Start: 2023-06-29 — End: 2023-10-06

## 2023-06-29 NOTE — ED Triage Notes (Signed)
Pt reports he has some nasal congestion and lethargic x 3 days

## 2023-06-29 NOTE — Discharge Instructions (Signed)
The COVID/flu test was negative. Take medication as prescribed.  Continue use of your albuterol inhaler as needed for shortness of breath. Increase fluids.  Recommend Pedialyte or Gatorade to help prevent dehydration. May take over-the-counter Tylenol as needed for pain, fever, or general discomfort. Go to the emergency department immediately if you experience worsening shortness of breath, difficulty breathing, or other concerns. Symptoms should improve over the next 5 to 7 days.  If symptoms fail to improve, or appear to be worsening, you may follow-up in this clinic or with your primary care physician for further evaluation. Follow-up as needed.

## 2023-06-29 NOTE — ED Provider Notes (Signed)
[facility]    CSN: [id number] Arrival date & time: 06/29/23  1446      History   Chief Complaint No chief complaint on file.   HPI Luis Donovan is a 78 y.o. male.   The history is provided by the patient and the spouse.   Patient presents with a 2-day history of fatigue, chills, nasal congestion, and shortness of breath.  Denies fever, headache, ear pain, nasal congestion, cough, wheezing, chest pain, abdominal pain, nausea, vomiting, diarrhea, or rash.  Patient denies any obvious known sick contacts.  With regard to his shortness of breath, patient states that he has shortness of breath at baseline.  States that his symptoms have not gotten any worse.  He is not taking any medication for symptoms.  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   Bladder tumor 03/11/2023   Class 1 obesity 02/26/2023   Malnutrition of moderate degree 02/25/2023   CHF exacerbation (HCC) 02/25/2023   Acute on chronic diastolic heart failure (HCC) 02/24/2023   PAF (paroxysmal atrial fibrillation) (HCC) 01/26/2023   Demand ischemia (HCC) 01/26/2023   Anemia 01/26/2023   Elevated alkaline phosphatase in newborn 01/21/2023   Hyponatremia 01/18/2023   Shortness of breath 01/18/2023   GERD without esophagitis 01/18/2023   Elevated d-dimer 01/18/2023    Hyperkalemia 01/18/2023   Elevated transaminase level 01/18/2023   Medication management 01/01/2023   Palliative care encounter 12/31/2022   Goals of care, counseling/discussion 12/31/2022   Cancer associated pain 12/31/2022   Counseling and coordination of care 12/31/2022   Constipation 12/31/2022   Acute kidney injury (nontraumatic) (HCC) 12/30/2022   Presumed Metastatic cancer (HCC) 12/13/2022   MI with VFib Arrest in 2005/CAD S/P percutaneous coronary angioplasty 12/13/2022   CKD stage 3a, GFR 45-59 ml/min (HCC) 12/13/2022   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 12/13/2022   Erectile dysfunction due to arterial insufficiency    Essential (primary) hypertension    Gastro-esophageal reflux disease with esophagitis    Hyperlipidemia, unspecified    Obesity, unspecified    Post-traumatic stress disorder, unspecified    Sleep apnea, unspecified    H/o  CVA (cerebral vascular accident) /2015 07/17/2013    Past Surgical History:  Procedure Laterality Date   CARDIAC DEFIBRILLATOR PLACEMENT     CYSTOSCOPY WITH BIOPSY N/A 03/11/2023   Procedure: CYSTOSCOPY WITH BIOPSY;  Surgeon: Malen Gauze, MD;  Location: [facility];  Service: Urology;  Laterality: N/A;   CYSTOSCOPY WITH STENT PLACEMENT Right 03/11/2023   Procedure: CYSTOSCOPY WITH STENT PLACEMENT;  Surgeon: Malen Gauze, MD;  Location: [facility];  Service: Urology;  Laterality: Right;   LUNG SURGERY     VATS   operation on skin of neck     cyst removal   ROTATOR CUFF REPAIR Left  TENDON REPAIR     TRANSURETHRAL RESECTION OF BLADDER TUMOR N/A 03/11/2023   Procedure: TRANSURETHRAL RESECTION OF BLADDER TUMOR (TURBT);  Surgeon: Malen Gauze, MD;  Location: [facility];  Service: Urology;  Laterality: N/A;   VIDEO ASSISTED THORACOSCOPY         Home Medications    Prior to Admission medications   Medication Sig Start Date End Date Taking? Authorizing Provider  cetirizine (ZYRTEC) 10 MG tablet Take 1 tablet  (10 mg total) by mouth daily. 06/29/23  Yes Leath-Warren, Sadie Haber, NP  fluticasone (FLONASE) 50 MCG/ACT nasal spray Place 2 sprays into both nostrils daily. 06/29/23  Yes Leath-Warren, Sadie Haber, NP  albuterol (VENTOLIN HFA) 108 (90 Base) MCG/ACT inhaler Inhale 2 puffs into the lungs every 4 (four) hours as needed for wheezing or shortness of breath. 07/16/22   [provider]  amiodarone (PACERONE) 200 MG tablet Take by mouth. 03/25/23   [provider]  apixaban (ELIQUIS) 5 MG TABS tablet Take 5 mg by mouth 2 (two) times daily.    [provider]  bisacodyl (DULCOLAX) 5 MG EC tablet Take 10 mg by mouth daily as needed for moderate constipation. 02/22/23   [provider]  bisoprolol (ZEBETA) 10 MG tablet Take 1 tablet (10 mg total) by mouth daily. 01/28/23   Johnson, Clanford L, MD  Carboxymethylcellulose Sod PF 0.5 % SOLN Place 1 drop into both eyes 2 (two) times daily. For dry eyes    [provider]  Cholecalciferol (VITAMIN D3) 50 MCG (2000 UT) CAPS Take 2,000 Units by mouth daily.    [provider]  clotrimazole (LOTRIMIN) 1 % cream Apply 1 Application topically 2 (two) times daily. Apply a small amount to affected area twice daily for feet.    [provider]  darolutamide (NUBEQA) 300 MG tablet Take by mouth. 03/30/23   [provider]  diclofenac Sodium (VOLTAREN) 1 % GEL Apply 2 g topically 4 (four) times daily as needed (pain). 07/16/22   [provider]  feeding supplement (ENSURE ENLIVE / ENSURE PLUS) LIQD Take 237 mLs by mouth 2 (two) times daily between meals. 02/26/23   Vassie Loll, MD  FLUoxetine (PROZAC) 20 MG capsule Take 80 mg by mouth daily. 03/03/22   [provider]  HYDROcodone-acetaminophen (NORCO) 5-325 MG tablet Take 1 tablet by mouth every 6 (six) hours as needed for moderate pain (pain score 4-6). 03/11/23   McKenzie, Mardene Celeste, MD  KETOTIFEN FUMARATE OP Apply 1 drop to eye 2 (two)  times daily as needed (allergies). 0.025%    [provider]  latanoprost (XALATAN) 0.005 % ophthalmic solution Place 1 drop into both eyes at bedtime.    [provider]  lidocaine (LIDODERM) 5 % Place 1 patch onto the skin daily. Apply 1 patch to skin once daily (Apply for  hours, then remove for  hours) 07/16/22   [provider]  losartan (COZAAR) 100 MG tablet Take 50 mg by mouth daily. 01/04/23   [provider]  mirtazapine (REMERON) 15 MG tablet Take 0.5 tablets (7.5 mg total) by mouth at bedtime. For loss of appetite 02/26/23   Vassie Loll, MD  Multiple Vitamin (MULTIVITAMIN WITH MINERALS) TABS tablet Take 1 tablet by mouth daily. 02/26/23   Vassie Loll, MD  omeprazole (PRILOSEC) 20 MG capsule Take 20 mg by mouth 2 (two) times daily before a meal. Take on an empty stomach 30 minutes prior to a meal    [provider]  ondansetron (ZOFRAN) 8 MG tablet Take 1 tablet (8 mg total) by mouth every 8 (eight) hours as needed for nausea or vomiting. 01/14/23   Pickenpack-Cousar, Arty Baumgartner, NP  oxyCODONE ER (XTAMPZA ER) 9 MG C12A Take 9 mg by mouth every 12 (twelve) hours. 01/14/23   Pickenpack-Cousar, Arty Baumgartner, NP  Oxycodone HCl 10 MG TABS Take 10 mg by mouth every 4 (four) hours as needed (severe pain). 02/16/23   [provider]  polyethylene glycol (MIRALAX / GLYCOLAX) 17 g packet Take 17 g by mouth 2 (two) times daily. 01/13/23   Pickenpack-Cousar, Arty Baumgartner, NP  potassium chloride (KLOR-CON M) 10 MEQ tablet Take 1 tablet (10 mEq total) by mouth every other day. 01/28/23   Johnson, Clanford L, MD  rosuvastatin (CRESTOR) 10 MG tablet Take 0.5 tablets by mouth daily. 04/01/23   [provider]  timolol (TIMOPTIC-XR) 0.5 % ophthalmic gel-forming Place 1 drop into both eyes daily. 08/13/22   [provider]  torsemide (DEMADEX) 20 MG tablet Take by mouth. Takes 20 mg in morning and 40 mg in evenings. 04/13/23   [provider]   traZODone (DESYREL) 50 MG tablet Take 50 mg by mouth at bedtime. 02/16/13   [provider]    Family History History reviewed. No pertinent family history.  Social History Social History   Tobacco Use   Smoking status: Former   Smokeless tobacco: Never  Vaping Use   Vaping status: Never Used  Substance Use Topics   Alcohol use: No   Drug use: No     Allergies   Docetaxel   Review of Systems Review of Systems Per HPI  Physical Exam Triage Vital Signs ED Triage Vitals  Encounter Vitals Group     BP 06/29/23 1453 (!) 168/80     Systolic BP Percentile --      Diastolic BP Percentile --      Pulse Rate 06/29/23 1453 77     Resp 06/29/23 1453 20     Temp 06/29/23 1453 98.4 F (36.9 C)     Temp Source 06/29/23 1453 Oral     SpO2 06/29/23 1453 95 %     Weight --      Height --      Head Circumference --      Peak Flow --      Pain Score 06/29/23 1455 0     Pain Loc --      Pain Education --      Exclude from Growth Chart --    No data found.  Updated Vital Signs BP (!) 168/80 (BP Location: Left Arm)   Pulse 77   Temp 98.4 F (36.9 C) (Oral)   Resp 20   SpO2 95%   Visual Acuity Right Eye Distance:   Left Eye Distance:   Bilateral Distance:    Right Eye Near:   Left Eye Near:    Bilateral Near:     Physical Exam Vitals and nursing note reviewed.  Constitutional:      General: He is not in acute distress.    Appearance: Normal appearance.  HENT:     Head: Normocephalic.     Right Ear: Tympanic membrane, ear canal and external ear normal.     Left Ear: Tympanic membrane, ear canal and external ear normal.     Nose: Rhinorrhea present.     Right Turbinates: Enlarged and swollen.     Left Turbinates: Enlarged and swollen.     Right  Sinus: No maxillary sinus tenderness or frontal sinus tenderness.     Left Sinus: No maxillary sinus tenderness or frontal sinus tenderness.     Mouth/Throat:     Lips: Pink.     Mouth: Mucous membranes are  moist.     Pharynx: Uvula midline. Postnasal drip present. No pharyngeal swelling, oropharyngeal exudate, posterior oropharyngeal erythema or uvula swelling.  Eyes:     Extraocular Movements: Extraocular movements intact.     Conjunctiva/sclera: Conjunctivae normal.     Pupils: Pupils are equal, round, and reactive to light.  Cardiovascular:     Rate and Rhythm: Normal rate and regular rhythm.     Pulses: Normal pulses.     Heart sounds: Normal heart sounds.  Pulmonary:     Effort: Pulmonary effort is normal. No respiratory distress.     Breath sounds: Normal breath sounds. No stridor. No wheezing, rhonchi or rales.  Abdominal:     General: Bowel sounds are normal.     Palpations: Abdomen is soft.     Tenderness: There is no abdominal tenderness.  Musculoskeletal:     Cervical back: Normal range of motion.  Lymphadenopathy:     Cervical: No cervical adenopathy.  Skin:    General: Skin is warm and dry.  Neurological:     General: No focal deficit present.     Mental Status: He is alert and oriented to person, place, and time.  Psychiatric:        Mood and Affect: Mood normal.        Behavior: Behavior normal.      UC Treatments / Results  Labs (all labs ordered are listed, but only abnormal results are displayed) Labs Reviewed  POC COVID19/FLU A&B COMBO - Normal    EKG   Radiology No results found.  Procedures Procedures (including critical care time)  Medications Ordered in UC Medications - No data to display  Initial Impression / Assessment and Plan / UC Course  I have reviewed the triage vital signs and the nursing notes.  Pertinent labs & imaging results that were available during my care of the patient were reviewed by me and considered in my medical decision making (see chart for details).  COVID/flu test was negative.  Symptoms consistent with viral illness.  Will treat nasal congestion with fluticasone 50 mcg nasal spray and with cetirizine 10 mg daily.   Supportive care recommendations were provided and discussed with the patient to include fluids, rest, and over-the-counter analgesics.  Discussed indications regarding follow-up.  Patient was in agreement with this plan of care and verbalized understanding.  All questions were answered.  Patient stable for discharge.   Final Clinical Impressions(s) / UC Diagnoses   Final diagnoses:  Flu-like symptoms  Viral illness     Discharge Instructions      The COVID/flu test was negative. Take medication as prescribed.  Continue use of your albuterol inhaler as needed for shortness of breath. Increase fluids.  Recommend Pedialyte or Gatorade to help prevent dehydration. May take over-the-counter Tylenol as needed for pain, fever, or general discomfort. Go to the emergency department immediately if you experience worsening shortness of breath, difficulty breathing, or other concerns. Symptoms should improve over the next 5 to 7 days.  If symptoms fail to improve, or appear to be worsening, you may follow-up in this clinic or with your primary care physician for further evaluation. Follow-up as needed.     ED Prescriptions     Medication Sig Dispense Auth.  Provider   fluticasone (FLONASE) 50 MCG/ACT nasal spray Place 2 sprays into both nostrils daily. 16 g Leath-Warren, Sadie Haber, NP   cetirizine (ZYRTEC) 10 MG tablet Take 1 tablet (10 mg total) by mouth daily. 30 tablet Leath-Warren, Sadie Haber, NP      PDMP not reviewed this encounter.   Abran Cantor, NP 06/29/23 1523

## 2023-07-01 ENCOUNTER — Other Ambulatory Visit (HOSPITAL_COMMUNITY): Payer: Self-pay | Admitting: Student in an Organized Health Care Education/Training Program

## 2023-07-01 DIAGNOSIS — C61 Malignant neoplasm of prostate: Secondary | ICD-10-CM

## 2023-07-01 DIAGNOSIS — M25552 Pain in left hip: Secondary | ICD-10-CM

## 2023-07-05 ENCOUNTER — Ambulatory Visit (HOSPITAL_COMMUNITY): Payer: No Typology Code available for payment source

## 2023-07-09 ENCOUNTER — Ambulatory Visit (HOSPITAL_COMMUNITY): Admission: RE | Admit: 2023-07-09 | Payer: No Typology Code available for payment source | Source: Ambulatory Visit

## 2023-07-12 ENCOUNTER — Ambulatory Visit: Payer: No Typology Code available for payment source | Admitting: Urology

## 2023-07-20 ENCOUNTER — Ambulatory Visit (HOSPITAL_COMMUNITY)
Admission: RE | Admit: 2023-07-20 | Discharge: 2023-07-20 | Disposition: A | Discharge status: Home / Self Care | Payer: No Typology Code available for payment source | Source: Ambulatory Visit | Attending: Urology | Admitting: Urology

## 2023-07-20 DIAGNOSIS — N1330 Unspecified hydronephrosis: Secondary | ICD-10-CM | POA: Diagnosis present

## 2023-08-06 ENCOUNTER — Encounter: Payer: Self-pay | Admitting: Emergency Medicine

## 2023-08-06 ENCOUNTER — Ambulatory Visit
Admission: EM | Admit: 2023-08-06 | Discharge: 2023-08-06 | Disposition: A | Discharge status: Home / Self Care | Attending: Family Medicine | Admitting: Family Medicine

## 2023-08-06 DIAGNOSIS — R159 Full incontinence of feces: Secondary | ICD-10-CM | POA: Diagnosis not present

## 2023-08-06 DIAGNOSIS — R32 Unspecified urinary incontinence: Secondary | ICD-10-CM | POA: Diagnosis not present

## 2023-08-06 HISTORY — DX: Malignant neoplasm of prostate: C61

## 2023-08-06 NOTE — ED Triage Notes (Signed)
Diarrhea x 3 days

## 2023-08-07 NOTE — ED Provider Notes (Signed)
 [facility]   [id number] 08/06/23 Arrival Time: 1732  ASSESSMENT & PLAN:  1. Urinary and fecal incontinence    Reported nighttime bowel/bladder incontinence with h/o prostate cancer. Cannot r/o invasive dz causing current symptoms. Discussed that he needs MRI of abd/pelvis. Is seen at [facility] hospital and doubts he could be seen in a reasonable time frame. Recommend ED evaluation. Reports that he would like to go home and think about it.  Without gross neurological abnormalities here.  Reviewed expectations re: course of current medical issues. Questions answered. Outlined signs and symptoms indicating need for more acute intervention. Patient verbalized understanding. After Visit Summary given.   SUBJECTIVE: History from: patient.  Luis Donovan is a 78 y.o. male who reports waking after having bowel movement while sleeping at night. "Didn't even know I did it". Past three evenings. With urinary incontinence also. H/O prostate cancer. Denies daytime bowel/bladder incontinence. Denies extremity sensation changes or weakness.  Denies recent travel/new medications/diet changes.  Past Surgical History:  Procedure Laterality Date   CARDIAC DEFIBRILLATOR PLACEMENT     CYSTOSCOPY WITH BIOPSY N/A 03/11/2023   Procedure: CYSTOSCOPY WITH BIOPSY;  Surgeon: Malen Gauze, MD;  Location: [facility];  Service: Urology;  Laterality: N/A;   CYSTOSCOPY WITH STENT PLACEMENT Right 03/11/2023   Procedure: CYSTOSCOPY WITH STENT PLACEMENT;  Surgeon: Malen Gauze, MD;  Location: [facility];  Service: Urology;  Laterality: Right;   LUNG SURGERY     VATS   operation on skin of neck     cyst removal   ROTATOR CUFF REPAIR Left    TENDON REPAIR     TRANSURETHRAL RESECTION OF BLADDER TUMOR N/A 03/11/2023   Procedure: TRANSURETHRAL RESECTION OF BLADDER TUMOR (TURBT);  Surgeon: Malen Gauze, MD;  Location: [facility];  Service: Urology;  Laterality: N/A;   VIDEO ASSISTED THORACOSCOPY       OBJECTIVE:  Vitals:   08/06/23 1737  BP: 137/67  Pulse: (!) 54  Resp: 18  Temp: 98 F (36.7 C)  TempSrc: Oral  SpO2: 95%    General appearance: alert; no distress Abdomen: soft; non-distended Extremities: no edema; symmetrical with no gross deformities Skin: warm; dry Neurologic: normal gait and LE movement Psychological: alert and cooperative; normal mood and affect   Allergies  Allergen Reactions   Docetaxel Shortness Of Breath                                               Past Medical History:  Diagnosis Date   Anemia    CVA (cerebral vascular accident) (HCC) 07/17/2013   Empyema (HCC)    Erectile dysfunction due to arterial insufficiency    Essential (primary) hypertension    Gastro-esophageal reflux disease with esophagitis    GERD (gastroesophageal reflux disease)    Head injury    Heart disease    Hyperlipidemia, unspecified    Hypertension    Obesity, unspecified    Pneumonia    Post-traumatic stress disorder, unspecified    Prostate cancer (HCC)    Sleep apnea, unspecified    Social History   Socioeconomic History   Marital status: Married    Spouse name: beverly   Number of children: 4   Years of education: Not on file   Highest education level: Not on file  Occupational History   Occupation: retired    Associate Professor: RETIRED  Tobacco Use   Smoking status: Former   Smokeless tobacco: Never  Vaping Use   Vaping status: Never Used  Substance and Sexual Activity   Alcohol use: No   Drug use: No   Sexual activity: Never    Birth control/protection: None  Other Topics Concern   Not on file  Social History Narrative   Not on file   Social Drivers of Health   Financial Resource Strain: Not on file  Food Insecurity: No Food Insecurity (04/26/2023)   Hunger Vital Sign    Worried About Running Out of Food in the Last Year: Never true    Ran Out of Food in the Last Year: Never true  Transportation Needs: No Transportation Needs (04/26/2023)    PRAPARE - Administrator, Civil Service (Medical): No    Lack of Transportation (Non-Medical): No  Physical Activity: Not on file  Stress: Not on file  Social Connections: Not on file  Intimate Partner Violence: Not At Risk (05/26/2023)   Received from [facility]   HITS    Over the last 12 months how often did your partner physically hurt you?: Never    Over the last 12 months how often did your partner insult you or talk down to you?: Never    Over the last 12 months how often did your partner threaten you with physical harm?: Never    Over the last 12 months how often did your partner scream or curse at you?: Never   History reviewed. No pertinent family history.    Mardella Layman, MD 08/07/23 1002

## 2023-08-11 ENCOUNTER — Ambulatory Visit: Payer: No Typology Code available for payment source | Admitting: Urology

## 2023-08-11 VITALS — BP 160/79 | HR 60

## 2023-08-11 DIAGNOSIS — N3289 Other specified disorders of bladder: Secondary | ICD-10-CM

## 2023-08-11 DIAGNOSIS — Z466 Encounter for fitting and adjustment of urinary device: Secondary | ICD-10-CM | POA: Diagnosis not present

## 2023-08-11 DIAGNOSIS — N1330 Unspecified hydronephrosis: Secondary | ICD-10-CM

## 2023-08-11 LAB — URINALYSIS, ROUTINE W REFLEX MICROSCOPIC
Bilirubin, UA: NEGATIVE
Glucose, UA: NEGATIVE
Ketones, UA: NEGATIVE
Nitrite, UA: NEGATIVE
Protein,UA: NEGATIVE
Specific Gravity, UA: 1.01 (ref 1.005–1.030)
Urobilinogen, Ur: 1 mg/dL (ref 0.2–1.0)
pH, UA: 7.5 (ref 5.0–7.5)

## 2023-08-11 LAB — MICROSCOPIC EXAMINATION: Bacteria, UA: NONE SEEN

## 2023-08-11 MED ORDER — CIPROFLOXACIN HCL 500 MG PO TABS
500.0000 mg | ORAL_TABLET | Freq: Once | ORAL | Status: AC
  Administered 2023-08-11: 500 mg via ORAL

## 2023-08-11 NOTE — Progress Notes (Signed)
   08/11/23  CC: prostate cancer   HPI: Mr Luis Donovan is a 77yo here for stent removal Blood pressure (!) 160/79, pulse 60. NED. A&Ox3.   No respiratory distress   Abd soft, NT, ND Normal phallus with bilateral descended testicles  Cystoscopy Procedure Note  Patient identification was confirmed, informed consent was obtained, and patient was prepped using Betadine solution.  Lidocaine jelly was administered per urethral meatus.     Pre-Procedure: - Inspection reveals a normal caliber ureteral meatus.  Procedure: The flexible cystoscope was introduced without difficulty - No urethral strictures/lesions are present. - Enlarged prostate  - Normal bladder neck - Bilateral ureteral orifices identified - Bladder mucosa  reveals no ulcers, tumors, or lesions - No bladder stones - No trabeculation  Usin a grasper the right ureteral stent was removed intact   Post-Procedure: - Patient tolerated the procedure well  Assessment/ Plan: Followup 6 weeks with a renal US  No follow-ups on file.  Wilkie Aye, MD

## 2023-08-15 ENCOUNTER — Encounter: Payer: Self-pay | Admitting: Urology

## 2023-08-15 NOTE — Patient Instructions (Signed)

## 2023-08-27 ENCOUNTER — Ambulatory Visit (HOSPITAL_COMMUNITY)
Admission: RE | Admit: 2023-08-27 | Discharge: 2023-08-27 | Disposition: A | Discharge status: Home / Self Care | Payer: No Typology Code available for payment source | Source: Ambulatory Visit | Attending: Student in an Organized Health Care Education/Training Program | Admitting: Student in an Organized Health Care Education/Training Program

## 2023-08-27 DIAGNOSIS — C61 Malignant neoplasm of prostate: Secondary | ICD-10-CM

## 2023-08-27 DIAGNOSIS — M25552 Pain in left hip: Secondary | ICD-10-CM | POA: Diagnosis present

## 2023-08-27 MED ORDER — IOHEXOL 300 MG/ML  SOLN
100.0000 mL | Freq: Once | INTRAMUSCULAR | Status: AC | PRN
  Administered 2023-08-27: 100 mL via INTRAVENOUS

## 2023-09-17 ENCOUNTER — Ambulatory Visit (HOSPITAL_COMMUNITY): Admission: RE | Admit: 2023-09-17 | Source: Ambulatory Visit

## 2023-09-24 ENCOUNTER — Ambulatory Visit: Admitting: Urology

## 2023-09-29 ENCOUNTER — Ambulatory Visit: Admitting: Gastroenterology

## 2023-09-29 NOTE — Progress Notes (Deleted)
 GI Office Note    Referring Provider: [facility] Primary Care Physician:  [facility]  Primary Gastroenterologist:  Chief Complaint   No chief complaint on file.    History of Present Illness   Luis Donovan is a 78 y.o. male presenting today for further evaluation GERD, anemia, colonoscopy.   Colonoscopy 2015: diverticulosis. Repeat in 10 years.   GERD/dysphagia: resolved on omeprazole BID. No prior EGD. Requesting EGD for Barrett's screening.   Prostate/bladder cancer. Metastatic prostate cancer, involving the bladder, unable to tolerate chemo. Back to home therapy.   Acute anemia, given blood while in hospital. Ferritin "extremely elevated, but TIBC, iron, iron sat normal". Hgb still low.   PE with start of eliquis  01/2023.   Moderate MR on ECHO 2024.  25 pound with loass in one year since diagnosied with cancer Constipation, miralax   Htn, hld, cae s/p arrest and stent in 2005, ICD 2009, replacement in 2015, OSA on CPAP, CKD, PTSD, PE on Eliquis , shoulder surg, left elbow, CVA, Afib with RVR Moviprep .  Labs 08/2023: Hgb 10.5, Hct 30.9, MCV 94.2, Platelet 242, alb 4.4, Tbili 0.5, AP 187H, AST 47H, ALT 41, ANA neg, actin IgG 4, neg, AMA, neg, iron 150, TIBC 264, iron sat 57, ferritin 4258H, Hep A IgM neg, HbsAg neg, ep B core IgM, neg, HBC DNA ned, HCV RNA neg, Hemochromatosis markers nega   CT chest/abd/pelvis angiography 11/2022: celiac, SMA patent. IMA not well seen. Gallbladder unremarkable. Spleen normlal. Liver normal. No bowel wall thickening. Mild mediastinal adenompathy with extensive periaortic and right iliac adnopathy most c/w metastatic disease or possibly lymphoma. Right inguinal adenopaty. Osseous metastases. Mild right hydroureteronephrosis appears to b due to occlusion from irregular mass invoving the posterior portion of the urinary bladder in region of right ureterovesical junction.   GB US : sludge noted, mild wall thickening,  measuring 5mm, no murphhy sign.   HIDA: GBEF 7  ECHO: LVEF 50-55%, mild to moderate MR  Medications   Current Outpatient Medications  Medication Sig Dispense Refill   albuterol  (VENTOLIN  HFA) 108 (90 Base) MCG/ACT inhaler Inhale 2 puffs into the lungs every 4 (four) hours as needed for wheezing or shortness of breath.     amiodarone (PACERONE) 200 MG tablet Take by mouth.     apixaban  (ELIQUIS ) 5 MG TABS tablet Take 5 mg by mouth 2 (two) times daily.     bisacodyl  (DULCOLAX) 5 MG EC tablet Take 10 mg by mouth daily as needed for moderate constipation.     bisoprolol  (ZEBETA ) 10 MG tablet Take 1 tablet (10 mg total) by mouth daily. 30 tablet 2   Carboxymethylcellulose Sod PF 0.5 % SOLN Place 1 drop into both eyes 2 (two) times daily. For dry eyes     cetirizine  (ZYRTEC ) 10 MG tablet Take 1 tablet (10 mg total) by mouth daily. 30 tablet 0   Cholecalciferol  (VITAMIN D3) 50 MCG (2000 UT) CAPS Take 2,000 Units by mouth daily.     clotrimazole (LOTRIMIN) 1 % cream Apply 1 Application topically 2 (two) times daily. Apply a small amount to affected area twice daily for feet.     darolutamide (NUBEQA) 300 MG tablet Take by mouth.     diclofenac Sodium (VOLTAREN) 1 % GEL Apply 2 g topically 4 (four) times daily as needed (pain).     feeding supplement (ENSURE ENLIVE / ENSURE PLUS) LIQD Take 237 mLs by mouth 2 (two) times daily between meals.     FLUoxetine  (  PROZAC ) 20 MG capsule Take 80 mg by mouth daily.     fluticasone  (FLONASE ) 50 MCG/ACT nasal spray Place 2 sprays into both nostrils daily. 16 g 0   HYDROcodone -acetaminophen  (NORCO) 5-325 MG tablet Take 1 tablet by mouth every 6 (six) hours as needed for moderate pain (pain score 4-6). 30 tablet 0   KETOTIFEN  FUMARATE OP Apply 1 drop to eye 2 (two) times daily as needed (allergies). 0.025%     latanoprost  (XALATAN ) 0.005 % ophthalmic solution Place 1 drop into both eyes at bedtime.     lidocaine  (LIDODERM ) 5 % Place 1 patch onto the skin daily.  Apply 1 patch to skin once daily (Apply for  hours, then remove for  hours)     losartan  (COZAAR ) 100 MG tablet Take 50 mg by mouth daily.     mirtazapine  (REMERON ) 15 MG tablet Take 0.5 tablets (7.5 mg total) by mouth at bedtime. For loss of appetite     Multiple Vitamin (MULTIVITAMIN WITH MINERALS) TABS tablet Take 1 tablet by mouth daily.     omeprazole (PRILOSEC) 20 MG capsule Take 20 mg by mouth 2 (two) times daily before a meal. Take on an empty stomach 30 minutes prior to a meal     ondansetron  (ZOFRAN ) 8 MG tablet Take 1 tablet (8 mg total) by mouth every 8 (eight) hours as needed for nausea or vomiting. 30 tablet 2   oxyCODONE  ER (XTAMPZA  ER) 9 MG C12A Take 9 mg by mouth every 12 (twelve) hours. 30 capsule 0   Oxycodone  HCl 10 MG TABS Take 10 mg by mouth every 4 (four) hours as needed (severe pain).     polyethylene glycol (MIRALAX  / GLYCOLAX ) 17 g packet Take 17 g by mouth 2 (two) times daily. 14 each 0   potassium chloride  (KLOR-CON  M) 10 MEQ tablet Take 1 tablet (10 mEq total) by mouth every other day. 15 tablet 1   rosuvastatin (CRESTOR) 10 MG tablet Take 0.5 tablets by mouth daily.     timolol  (TIMOPTIC -XR) 0.5 % ophthalmic gel-forming Place 1 drop into both eyes daily.     torsemide  (DEMADEX ) 20 MG tablet Take by mouth. Takes 20 mg in morning and 40 mg in evenings.     traZODone  (DESYREL ) 50 MG tablet Take 50 mg by mouth at bedtime.     No current facility-administered medications for this visit.    Allergies   Allergies as of 09/29/2023 - Review Complete 08/15/2023  Allergen Reaction Noted   Docetaxel Shortness Of Breath 05/26/2023    Past Medical History   Past Medical History:  Diagnosis Date   Anemia    CVA (cerebral vascular accident) (HCC) 07/17/2013   Empyema (HCC)    Erectile dysfunction due to arterial insufficiency    Essential (primary) hypertension    Gastro-esophageal reflux disease with esophagitis    GERD (gastroesophageal reflux disease)    Head  injury    Heart disease    Hyperlipidemia, unspecified    Hypertension    Obesity, unspecified    Pneumonia    Post-traumatic stress disorder, unspecified    Prostate cancer (HCC)    Sleep apnea, unspecified     Past Surgical History   Past Surgical History:  Procedure Laterality Date   CARDIAC DEFIBRILLATOR PLACEMENT     CYSTOSCOPY WITH BIOPSY N/A 03/11/2023   Procedure: CYSTOSCOPY WITH BIOPSY;  Surgeon: Marco Severs, MD;  Location: [facility];  Service: Urology;  Laterality: N/A;   CYSTOSCOPY WITH STENT PLACEMENT  Right 03/11/2023   Procedure: CYSTOSCOPY WITH STENT PLACEMENT;  Surgeon: Marco Severs, MD;  Location: [facility];  Service: Urology;  Laterality: Right;   LUNG SURGERY     VATS   operation on skin of neck     cyst removal   ROTATOR CUFF REPAIR Left    TENDON REPAIR     TRANSURETHRAL RESECTION OF BLADDER TUMOR N/A 03/11/2023   Procedure: TRANSURETHRAL RESECTION OF BLADDER TUMOR (TURBT);  Surgeon: Marco Severs, MD;  Location: [facility];  Service: Urology;  Laterality: N/A;   VIDEO ASSISTED THORACOSCOPY      Past Family History   No family history on file.  Past Social History   Social History   Socioeconomic History   Marital status: Married    Spouse name: beverly   Number of children: 4   Years of education: Not on file   Highest education level: Not on file  Occupational History   Occupation: retired    Associate Professor: RETIRED  Tobacco Use   Smoking status: Former   Smokeless tobacco: Never  Advertising account planner   Vaping status: Never Used  Substance and Sexual Activity   Alcohol  use: No   Drug use: No   Sexual activity: Never    Birth control/protection: None  Other Topics Concern   Not on file  Social History Narrative   Not on file   Social Drivers of Health   Financial Resource Strain: Not on file  Food Insecurity: No Food Insecurity (04/26/2023)   Hunger Vital Sign    Worried About Running Out of Food in the Last Year: Never true    Ran  Out of Food in the Last Year: Never true  Transportation Needs: No Transportation Needs (04/26/2023)   PRAPARE - Administrator, Civil Service (Medical): No    Lack of Transportation (Non-Medical): No  Physical Activity: Not on file  Stress: Not on file  Social Connections: Not on file  Intimate Partner Violence: Not At Risk (05/26/2023)   Received from [facility]   HITS    Over the last 12 months how often did your partner physically hurt you?: Never    Over the last 12 months how often did your partner insult you or talk down to you?: Never    Over the last 12 months how often did your partner threaten you with physical harm?: Never    Over the last 12 months how often did your partner scream or curse at you?: Never    Review of Systems   General: Negative for anorexia, weight loss, fever, chills, fatigue, weakness. Eyes: Negative for vision changes.  ENT: Negative for hoarseness, difficulty swallowing , nasal congestion. CV: Negative for chest pain, angina, palpitations, dyspnea on exertion, peripheral edema.  Respiratory: Negative for dyspnea at rest, dyspnea on exertion, cough, sputum, wheezing.  GI: See history of present illness. GU:  Negative for dysuria, hematuria, urinary incontinence, urinary frequency, nocturnal urination.  MS: Negative for joint pain, low back pain.  Derm: Negative for rash or itching.  Neuro: Negative for weakness, abnormal sensation, seizure, frequent headaches, memory loss,  confusion.  Psych: Negative for anxiety, depression, suicidal ideation, hallucinations.  Endo: Negative for unusual weight change.  Heme: Negative for bruising or bleeding. Allergy: Negative for rash or hives.  Physical Exam   There were no vitals taken for this visit.   General: Well-nourished, well-developed in no acute distress.  Head: Normocephalic, atraumatic.   Eyes: Conjunctiva pink, no icterus. Mouth:  Oropharyngeal mucosa moist and pink , no lesions  erythema or exudate. Neck: Supple without thyromegaly, masses, or lymphadenopathy.  Lungs: Clear to auscultation bilaterally.  Heart: Regular rate and rhythm, no murmurs rubs or gallops.  Abdomen: Bowel sounds are normal, nontender, nondistended, no hepatosplenomegaly or masses,  no abdominal bruits or hernia, no rebound or guarding.   Rectal: *** Extremities: No lower extremity edema. No clubbing or deformities.  Neuro: Alert and oriented x 4 , grossly normal neurologically.  Skin: Warm and dry, no rash or jaundice.   Psych: Alert and cooperative, normal mood and affect.  Labs   *** Imaging Studies   No results found.  Assessment       PLAN   ***   Trudie Fuse. Harles Lied, MHS, PA-C [facility]

## 2023-10-05 ENCOUNTER — Encounter (HOSPITAL_COMMUNITY): Payer: Self-pay

## 2023-10-05 ENCOUNTER — Emergency Department (HOSPITAL_COMMUNITY)

## 2023-10-05 ENCOUNTER — Observation Stay (HOSPITAL_COMMUNITY)
Admission: EM | Admit: 2023-10-05 | Discharge: 2023-10-06 | Disposition: A | Discharge status: Home / Self Care | Attending: Family Medicine | Admitting: Family Medicine

## 2023-10-05 ENCOUNTER — Other Ambulatory Visit: Payer: Self-pay

## 2023-10-05 DIAGNOSIS — N184 Chronic kidney disease, stage 4 (severe): Secondary | ICD-10-CM | POA: Insufficient documentation

## 2023-10-05 DIAGNOSIS — R0789 Other chest pain: Principal | ICD-10-CM | POA: Diagnosis present

## 2023-10-05 DIAGNOSIS — R7989 Other specified abnormal findings of blood chemistry: Secondary | ICD-10-CM | POA: Insufficient documentation

## 2023-10-05 DIAGNOSIS — Z8673 Personal history of transient ischemic attack (TIA), and cerebral infarction without residual deficits: Secondary | ICD-10-CM | POA: Insufficient documentation

## 2023-10-05 DIAGNOSIS — Z95810 Presence of automatic (implantable) cardiac defibrillator: Secondary | ICD-10-CM | POA: Diagnosis not present

## 2023-10-05 DIAGNOSIS — E876 Hypokalemia: Secondary | ICD-10-CM | POA: Insufficient documentation

## 2023-10-05 DIAGNOSIS — I5032 Chronic diastolic (congestive) heart failure: Secondary | ICD-10-CM | POA: Insufficient documentation

## 2023-10-05 DIAGNOSIS — Z79899 Other long term (current) drug therapy: Secondary | ICD-10-CM | POA: Insufficient documentation

## 2023-10-05 DIAGNOSIS — Z7901 Long term (current) use of anticoagulants: Secondary | ICD-10-CM | POA: Diagnosis not present

## 2023-10-05 DIAGNOSIS — N189 Chronic kidney disease, unspecified: Secondary | ICD-10-CM | POA: Diagnosis present

## 2023-10-05 DIAGNOSIS — K219 Gastro-esophageal reflux disease without esophagitis: Secondary | ICD-10-CM | POA: Diagnosis not present

## 2023-10-05 DIAGNOSIS — I4901 Ventricular fibrillation: Secondary | ICD-10-CM | POA: Insufficient documentation

## 2023-10-05 DIAGNOSIS — N179 Acute kidney failure, unspecified: Secondary | ICD-10-CM | POA: Diagnosis not present

## 2023-10-05 DIAGNOSIS — E66811 Obesity, class 1: Secondary | ICD-10-CM | POA: Diagnosis not present

## 2023-10-05 DIAGNOSIS — I2510 Atherosclerotic heart disease of native coronary artery without angina pectoris: Secondary | ICD-10-CM | POA: Diagnosis not present

## 2023-10-05 DIAGNOSIS — Z6834 Body mass index (BMI) 34.0-34.9, adult: Secondary | ICD-10-CM | POA: Insufficient documentation

## 2023-10-05 DIAGNOSIS — E782 Mixed hyperlipidemia: Secondary | ICD-10-CM | POA: Diagnosis present

## 2023-10-05 DIAGNOSIS — C61 Malignant neoplasm of prostate: Secondary | ICD-10-CM | POA: Diagnosis not present

## 2023-10-05 DIAGNOSIS — I130 Hypertensive heart and chronic kidney disease with heart failure and stage 1 through stage 4 chronic kidney disease, or unspecified chronic kidney disease: Secondary | ICD-10-CM | POA: Insufficient documentation

## 2023-10-05 DIAGNOSIS — I482 Chronic atrial fibrillation, unspecified: Secondary | ICD-10-CM | POA: Insufficient documentation

## 2023-10-05 DIAGNOSIS — R079 Chest pain, unspecified: Secondary | ICD-10-CM

## 2023-10-05 LAB — TROPONIN I (HIGH SENSITIVITY)
Troponin I (High Sensitivity): 23 ng/L — ABNORMAL HIGH (ref <18)
Troponin I (High Sensitivity): 29 ng/L — ABNORMAL HIGH (ref <18)

## 2023-10-05 LAB — CBC
HCT: 25.7 % — ABNORMAL LOW (ref 39.0–52.0)
Hemoglobin: 8.4 g/dL — ABNORMAL LOW (ref 13.0–17.0)
MCH: 31.8 pg (ref 26.0–34.0)
MCHC: 32.7 g/dL (ref 30.0–36.0)
MCV: 97.3 fL (ref 80.0–100.0)
Platelets: 220 10*3/uL (ref 150–400)
RBC: 2.64 MIL/uL — ABNORMAL LOW (ref 4.22–5.81)
RDW: 14.4 % (ref 11.5–15.5)
WBC: 9 10*3/uL (ref 4.0–10.5)
nRBC: 0 % (ref 0.0–0.2)

## 2023-10-05 NOTE — ED Triage Notes (Signed)
 Pt presents to ED with c/o chest pain and sob that started today.

## 2023-10-05 NOTE — ED Provider Notes (Signed)
 [facility] Emergency Department Provider Note MRN:  [id number]  Arrival date & time: 10/06/23     Chief Complaint   Chest Pain (sob)   History of Present Illness   Luis Donovan is a 78 y.o. year-old male with a history of stroke, stage IV prostate cancer, MI presenting to the ED with chief complaint of chest pain.  Burning chest pain and dyspnea on exertion for the past 24 hours, not going away.  Feeling tired.  Denies dizziness or diaphoresis, no nausea or vomiting.  Review of Systems  A thorough review of systems was obtained and all systems are negative except as noted in the HPI and PMH.   Patient's Health History    Past Medical History:  Diagnosis Date   Anemia    CVA (cerebral vascular accident) (HCC) 07/17/2013   Empyema (HCC)    Erectile dysfunction due to arterial insufficiency    Essential (primary) hypertension    Gastro-esophageal reflux disease with esophagitis    GERD (gastroesophageal reflux disease)    Head injury    Heart disease    Hyperlipidemia, unspecified    Hypertension    Obesity, unspecified    Pneumonia    Post-traumatic stress disorder, unspecified    Prostate cancer (HCC)    Sleep apnea, unspecified     Past Surgical History:  Procedure Laterality Date   CARDIAC DEFIBRILLATOR PLACEMENT     CYSTOSCOPY WITH BIOPSY N/A 03/11/2023   Procedure: CYSTOSCOPY WITH BIOPSY;  Surgeon: Marco Severs, MD;  Location: [facility];  Service: Urology;  Laterality: N/A;   CYSTOSCOPY WITH STENT PLACEMENT Right 03/11/2023   Procedure: CYSTOSCOPY WITH STENT PLACEMENT;  Surgeon: Marco Severs, MD;  Location: [facility];  Service: Urology;  Laterality: Right;   LUNG SURGERY     VATS   operation on skin of neck     cyst removal   ROTATOR CUFF REPAIR Left    TENDON REPAIR     TRANSURETHRAL RESECTION OF BLADDER TUMOR N/A 03/11/2023   Procedure: TRANSURETHRAL RESECTION OF BLADDER TUMOR (TURBT);  Surgeon: Marco Severs, MD;   Location: [facility];  Service: Urology;  Laterality: N/A;   VIDEO ASSISTED THORACOSCOPY      History reviewed. No pertinent family history.  Social History   Socioeconomic History   Marital status: Married    Spouse name: beverly   Number of children: 4   Years of education: Not on file   Highest education level: Not on file  Occupational History   Occupation: retired    Associate Professor: RETIRED  Tobacco Use   Smoking status: Former   Smokeless tobacco: Never  Advertising account planner   Vaping status: Never Used  Substance and Sexual Activity   Alcohol  use: No   Drug use: No   Sexual activity: Never    Birth control/protection: None  Other Topics Concern   Not on file  Social History Narrative   Not on file   Social Drivers of Health   Financial Resource Strain: Not on file  Food Insecurity: No Food Insecurity (04/26/2023)   Hunger Vital Sign    Worried About Running Out of Food in the Last Year: Never true    Ran Out of Food in the Last Year: Never true  Transportation Needs: No Transportation Needs (04/26/2023)   PRAPARE - Administrator, Civil Service (Medical): No    Lack of Transportation (Non-Medical): No  Physical Activity: Not on file  Stress: Not on file  Social Connections: Not on file  Intimate Partner Violence: Not At Risk (05/26/2023)   [facility]   HITS    Over the last 12 months how often did your partner physically hurt you?: Never    Over the last 12 months how often did your partner insult you or talk down to you?: Never    Over the last 12 months how often did your partner threaten you with physical harm?: Never    Over the last 12 months how often did your partner scream or curse at you?: Never     Physical Exam   Vitals:   10/06/23 0030 10/06/23 0045  BP: (!) 149/103 (!) 141/59  Pulse: 88 92  Resp: 17 19  Temp:    SpO2: 98% 91%    CONSTITUTIONAL: Well-appearing, NAD NEURO/PSYCH:  Alert and oriented x 3, no focal deficits EYES:   eyes equal and reactive ENT/NECK:  no LAD, no JVD CARDIO: Regular rate, well-perfused, normal S1 and S2 PULM:  CTAB no wheezing or rhonchi GI/GU:  non-distended, non-tender MSK/SPINE:  No gross deformities, no edema SKIN:  no rash, atraumatic   *Additional and/or pertinent findings included in MDM below  Diagnostic and Interventional Summary    EKG Interpretation Date/Time:  Tuesday Oct 05 2023 23:52:50 EDT Ventricular Rate:  102 PR Interval:  116 QRS Duration:  99 QT Interval:  362 QTC Calculation: 472 R Axis:   75  Text Interpretation: Atrial fibrillation Repol abnrm, severe global ischemia (LM/MVD) Confirmed by Gwenetta Lennert [phone]) on 10/05/2023 11:54:56 PM       Labs Reviewed  BASIC METABOLIC PANEL WITH GFR - Abnormal; Notable for the following components:      Result Value   Potassium 3.3 (*)    Chloride 97 (*)    Glucose, Bld 121 (*)    BUN 69 (*)    Creatinine, Ser 2.46 (*)    Calcium  8.7 (*)    GFR, Estimated 27 (*)    All other components within normal limits  CBC - Abnormal; Notable for the following components:   RBC 2.64 (*)    Hemoglobin 8.4 (*)    HCT 25.7 (*)    All other components within normal limits  TROPONIN I (HIGH SENSITIVITY) - Abnormal; Notable for the following components:   Troponin I (High Sensitivity) 23 (*)    All other components within normal limits  TROPONIN I (HIGH SENSITIVITY) - Abnormal; Notable for the following components:   Troponin I (High Sensitivity) 29 (*)    All other components within normal limits    DG Chest 2 View  Final Result      Medications  alum & mag hydroxide-simeth (MAALOX/MYLANTA) 200-200-20 MG/5ML suspension 30 mL (has no administration in time range)  sodium chloride  0.9 % bolus 500 mL (has no administration in time range)  aspirin chewable tablet 324 mg (has no administration in time range)  nitroGLYCERIN  (NITROSTAT ) SL tablet 0.4 mg (has no administration in time range)     Procedures  /  Critical  Care .Critical Care  Performed by: Edson Graces, MD Authorized by: Edson Graces, MD   Critical care provider statement:    Critical care time (minutes):  35   Critical care was necessary to treat or prevent imminent or life-threatening deterioration of the following conditions: Chest pain with elevated troponin.   Critical care was time spent personally by me on the following activities:  Development of treatment plan with patient or surrogate,  discussions with consultants, evaluation of patient's response to treatment, examination of patient, ordering and review of laboratory studies, ordering and review of radiographic studies, ordering and performing treatments and interventions, pulse oximetry, re-evaluation of patient's condition and review of old charts   ED Course and Medical Decision Making  Initial Impression and Ddx Chest pain with dyspnea on exertion, history of MI, history of A-fib, history of stage IV prostate cancer.  With his MI back in 2005 he had V-fib arrest.  Patient is well-appearing, mildly tachycardic in A-fib.  No acute distress.  No significant leg pain or swelling.  Endorses full compliance with his medications, including Eliquis .  Differential diagnosis includes ACS, pneumonia, pulmonary edema, GERD  Past medical/surgical history that increases complexity of ED encounter: See above  Interpretation of Diagnostics I personally reviewed the EKG and my interpretation is as follows: A-fib  Labs reveal acute kidney injury.  Minimally elevated troponin  Patient Reassessment and Ultimate Disposition/Management    Given the chest pain with elevated troponin, acute kidney injury patient is admitted to hospital service.   Patient management required discussion with the following services or consulting groups:  Hospitalist Service  Complexity of Problems Addressed Acute illness or injury that poses threat of life of bodily function  Additional Data Reviewed and  Analyzed Further history obtained from: Further history from spouse/family member  Additional Factors Impacting ED Encounter Risk Consideration of hospitalization  Merrick Abe. Harless Lien, MD [facility] Emergency Medicine [facility] [email or web address]  Final Clinical Impressions(s) / ED Diagnoses     ICD-10-CM   1. Chest pain, unspecified type  R07.9     2. AKI (acute kidney injury) (HCC)  N17.9       ED Discharge Orders     None        Discharge Instructions Discussed with and Provided to Patient:   Discharge Instructions   None      Edson Graces, MD 10/06/23 0100

## 2023-10-06 ENCOUNTER — Observation Stay (HOSPITAL_COMMUNITY)

## 2023-10-06 ENCOUNTER — Ambulatory Visit (HOSPITAL_COMMUNITY)

## 2023-10-06 ENCOUNTER — Other Ambulatory Visit (HOSPITAL_COMMUNITY): Payer: Self-pay | Admitting: *Deleted

## 2023-10-06 DIAGNOSIS — I4891 Unspecified atrial fibrillation: Secondary | ICD-10-CM | POA: Diagnosis not present

## 2023-10-06 DIAGNOSIS — K219 Gastro-esophageal reflux disease without esophagitis: Secondary | ICD-10-CM

## 2023-10-06 DIAGNOSIS — R7989 Other specified abnormal findings of blood chemistry: Secondary | ICD-10-CM | POA: Insufficient documentation

## 2023-10-06 DIAGNOSIS — I482 Chronic atrial fibrillation, unspecified: Secondary | ICD-10-CM | POA: Insufficient documentation

## 2023-10-06 DIAGNOSIS — R0789 Other chest pain: Secondary | ICD-10-CM | POA: Diagnosis not present

## 2023-10-06 DIAGNOSIS — R079 Chest pain, unspecified: Secondary | ICD-10-CM

## 2023-10-06 DIAGNOSIS — N184 Chronic kidney disease, stage 4 (severe): Secondary | ICD-10-CM

## 2023-10-06 DIAGNOSIS — E782 Mixed hyperlipidemia: Secondary | ICD-10-CM

## 2023-10-06 DIAGNOSIS — E876 Hypokalemia: Secondary | ICD-10-CM | POA: Insufficient documentation

## 2023-10-06 DIAGNOSIS — N179 Acute kidney failure, unspecified: Secondary | ICD-10-CM

## 2023-10-06 LAB — ECHOCARDIOGRAM COMPLETE
AR max vel: 1.65 cm2
AV Area VTI: 1.82 cm2
AV Area mean vel: 1.78 cm2
AV Mean grad: 7 mmHg
AV Peak grad: 13.8 mmHg
Ao pk vel: 1.86 m/s
Area-P 1/2: 4.06 cm2
Height: 67 in
MV M vel: 5.19 m/s
MV Peak grad: 107.7 mmHg
S' Lateral: 3 cm
Weight: 3477.98 [oz_av]

## 2023-10-06 LAB — BASIC METABOLIC PANEL WITH GFR
Anion gap: 10 (ref 5–15)
BUN: 69 mg/dL — ABNORMAL HIGH (ref 8–23)
CO2: 27 mmol/L (ref 22–32)
Calcium: 8.7 mg/dL — ABNORMAL LOW (ref 8.9–10.3)
Chloride: 97 mmol/L — ABNORMAL LOW (ref 98–111)
Creatinine, Ser: 2.46 mg/dL — ABNORMAL HIGH (ref 0.61–1.24)
GFR, Estimated: 27 mL/min — ABNORMAL LOW (ref >60)
Glucose, Bld: 121 mg/dL — ABNORMAL HIGH (ref 70–99)
Potassium: 3.3 mmol/L — ABNORMAL LOW (ref 3.5–5.1)
Sodium: 138 mmol/L (ref 135–145)

## 2023-10-06 LAB — TROPONIN I (HIGH SENSITIVITY)
Troponin I (High Sensitivity): 41 ng/L — ABNORMAL HIGH (ref <18)
Troponin I (High Sensitivity): 37 ng/L — ABNORMAL HIGH (ref <18)

## 2023-10-06 LAB — PHOSPHORUS: Phosphorus: 4.4 mg/dL (ref 2.5–4.6)

## 2023-10-06 LAB — MAGNESIUM: Magnesium: 2.2 mg/dL (ref 1.7–2.4)

## 2023-10-06 MED ORDER — NITROGLYCERIN 0.4 MG SL SUBL
SUBLINGUAL_TABLET | SUBLINGUAL | Status: AC
  Filled 2023-10-06: qty 1

## 2023-10-06 MED ORDER — NITROGLYCERIN 0.4 MG SL SUBL: 0.4000 mg | SUBLINGUAL_TABLET | Freq: Once | SUBLINGUAL | Status: AC

## 2023-10-06 MED ORDER — ACETAMINOPHEN 325 MG PO TABS: 650.0000 mg | ORAL_TABLET | Freq: Four times a day (QID) | ORAL | Status: DC | PRN

## 2023-10-06 MED ORDER — NITROGLYCERIN 0.4 MG SL SUBL
SUBLINGUAL_TABLET | SUBLINGUAL | Status: AC
  Administered 2023-10-06: 0.4 mg via SUBLINGUAL
  Filled 2023-10-06: qty 1

## 2023-10-06 MED ORDER — ROSUVASTATIN CALCIUM 10 MG PO TABS
5.0000 mg | ORAL_TABLET | Freq: Every day | ORAL | Status: DC
  Administered 2023-10-06: 5 mg via ORAL
  Filled 2023-10-06: qty 1

## 2023-10-06 MED ORDER — PANTOPRAZOLE SODIUM 40 MG PO TBEC: 40.0000 mg | DELAYED_RELEASE_TABLET | Freq: Two times a day (BID) | ORAL | 0 refills | Status: AC

## 2023-10-06 MED ORDER — LOSARTAN POTASSIUM 50 MG PO TABS
50.0000 mg | ORAL_TABLET | Freq: Every day | ORAL | Status: DC
  Administered 2023-10-06: 50 mg via ORAL
  Filled 2023-10-06: qty 1

## 2023-10-06 MED ORDER — OXYCODONE HCL ER 10 MG PO T12A
10.0000 mg | EXTENDED_RELEASE_TABLET | Freq: Two times a day (BID) | ORAL | Status: DC
  Administered 2023-10-06: 10 mg via ORAL
  Filled 2023-10-06: qty 1

## 2023-10-06 MED ORDER — SODIUM CHLORIDE 0.9 % IV BOLUS
500.0000 mL | Freq: Once | INTRAVENOUS | Status: AC
  Administered 2023-10-06: 500 mL via INTRAVENOUS

## 2023-10-06 MED ORDER — ALUM & MAG HYDROXIDE-SIMETH 200-200-20 MG/5ML PO SUSP
30.0000 mL | Freq: Once | ORAL | Status: AC
  Administered 2023-10-06: 30 mL via ORAL
  Filled 2023-10-06: qty 30

## 2023-10-06 MED ORDER — AMIODARONE HCL 200 MG PO TABS
200.0000 mg | ORAL_TABLET | Freq: Every day | ORAL | Status: DC
  Filled 2023-10-06: qty 1

## 2023-10-06 MED ORDER — ONDANSETRON HCL 4 MG PO TABS
4.0000 mg | ORAL_TABLET | Freq: Four times a day (QID) | ORAL | Status: DC | PRN
Start: 2023-10-06 — End: 2023-10-07

## 2023-10-06 MED ORDER — POTASSIUM CHLORIDE CRYS ER 20 MEQ PO TBCR
40.0000 meq | EXTENDED_RELEASE_TABLET | Freq: Once | ORAL | Status: AC
  Administered 2023-10-06: 40 meq via ORAL
  Filled 2023-10-06: qty 2

## 2023-10-06 MED ORDER — NITROGLYCERIN 0.4 MG SL SUBL: 0.4000 mg | SUBLINGUAL_TABLET | SUBLINGUAL | Status: DC | PRN

## 2023-10-06 MED ORDER — PANTOPRAZOLE SODIUM 40 MG PO TBEC
40.0000 mg | DELAYED_RELEASE_TABLET | Freq: Every day | ORAL | Status: DC
  Administered 2023-10-06: 40 mg via ORAL
  Filled 2023-10-06: qty 1

## 2023-10-06 MED ORDER — AMIODARONE HCL 200 MG PO TABS: 200.0000 mg | ORAL_TABLET | Freq: Two times a day (BID) | ORAL | 0 refills | Status: DC

## 2023-10-06 MED ORDER — ONDANSETRON HCL 4 MG/2ML IJ SOLN: 4.0000 mg | Freq: Four times a day (QID) | INTRAMUSCULAR | Status: DC | PRN

## 2023-10-06 MED ORDER — ACETAMINOPHEN 650 MG RE SUPP: 650.0000 mg | Freq: Four times a day (QID) | RECTAL | Status: DC | PRN

## 2023-10-06 MED ORDER — APIXABAN 5 MG PO TABS
5.0000 mg | ORAL_TABLET | Freq: Two times a day (BID) | ORAL | Status: DC
  Administered 2023-10-06: 5 mg via ORAL
  Filled 2023-10-06: qty 1

## 2023-10-06 MED ORDER — ASPIRIN 81 MG PO CHEW
324.0000 mg | CHEWABLE_TABLET | Freq: Once | ORAL | Status: AC
  Administered 2023-10-06: 324 mg via ORAL
  Filled 2023-10-06: qty 4

## 2023-10-06 MED ORDER — LACTATED RINGERS IV SOLN: INTRAVENOUS | Status: AC

## 2023-10-06 MED ORDER — OXYCODONE HCL 5 MG PO TABS: 10.0000 mg | ORAL_TABLET | ORAL | Status: DC | PRN

## 2023-10-06 MED ORDER — AMIODARONE HCL 200 MG PO TABS
200.0000 mg | ORAL_TABLET | Freq: Two times a day (BID) | ORAL | Status: DC
  Administered 2023-10-06: 200 mg via ORAL

## 2023-10-06 NOTE — Progress Notes (Signed)
   10/06/23 0216  Vitals  Temp 98.1 F (36.7 C)  Temp Source Oral  BP (!) 157/89  MAP (mmHg) 107  BP Location Right Arm  BP Method Automatic  Patient Position (if appropriate) Lying  Pulse Rate 91  Pulse Rate Source Monitor  ECG Heart Rate 91  Resp 19  Level of Consciousness  Level of Consciousness Alert  MEWS COLOR  MEWS Score Color Green  Oxygen Therapy  SpO2 100 %  O2 Device Room Air  Height and Weight  Weight 98.6 kg  Type of Scale Used Standing  Type of Weight Actual  BMI (Calculated) 34.04  MEWS Score  MEWS Temp 0  MEWS Systolic 0  MEWS Pulse 0  MEWS RR 0  MEWS LOC 0  MEWS Score 0

## 2023-10-06 NOTE — Discharge Summary (Signed)
 Physician Discharge Summary   Patient: Luis Donovan MRN: [id number] DOB: 05-Jun-1945  Admit date:     10/05/2023  Discharge date: 10/06/23  Discharge Physician: Wynetta Heckle   PCP: [facility]   Recommendations at discharge:  Follow up with cardiology, EP at the [place]. Not maintaining NSR despite amiodarone (reloading with 200mg  BID x3 weeks) and has resting bradycardia limiting options, consider EP reevaluation after discharge. Consider ischemic evaluation (admitting for atypical chest pain with mild demand ischemia, resolved symptoms, downtrending troponin and no WMA on echocardiogram).  Follow up with GI, trial PPI x4 weeks for atypical chest pain and belching.   Discharge Diagnoses: Principal Problem:   Atypical chest pain Active Problems:   Mixed hyperlipidemia   Acute kidney injury superimposed on chronic kidney disease (HCC)   GERD without esophagitis   Elevated troponin   Hypokalemia   Chronic atrial fibrillation with RVR [facility])  Hospital Course: No notes on file  Assessment and Plan: Chest pain: Atypical with mild troponin elevation (chronic, last recheck trending down 41 > 37), associated with GI symptoms, somewhat relieved in setting of NTG ?esophageal spasm.  - Start PPI with plan for 4 weeks trial period, follow up with GI (has follow up soon for colonoscopy) - Given hx CAD s/p PCI, continue anticoagulation in lieu of antiplatelet, lowest dose metoprolol , and high intensity statin.  AKI on CKD IV: SCr ranges 1.3 - 2.7 on all our labs dating back to 2012. Increases threshold for cardiac catheterization.  - Avoid nephrotoxins, monitor at follow up. Continue home torsemide . - Renal U/S showed no hydronephrosis (had been scheduled for this as outpatient, ordered by Dr. Claretta Croft).   Atrial fibrillation with RVR: Possibly precipitant of demand ischemia, now back to NSR/SB.  - Load amiodarone orally per cardiology, f/u with EP after discharge. Continue  eliquis    Metastatic prostate CA:  - Continue outpatient follow up, darolutamide - Continue chronic opioid pain medications  Hypokalemia: Supplemented, continue standing supplement at DC  Class 1 obesity: Body mass index is 34.05 kg/m.   Consultants: Cardiology, Dr. Amanda Jungling Procedures performed: Echocardiogram  Disposition: Home Diet recommendation:  Cardiac diet DISCHARGE MEDICATION: Allergies as of 10/06/2023       Reactions   Docetaxel Shortness Of Breath        Medication List     PAUSE taking these medications    Pacerone 200 MG tablet Wait to take this until: October 27, 2023 Generic drug: amiodarone Take 200 mg by mouth daily. You also have another medication with the same name that you may need to continue taking.       STOP taking these medications    bisoprolol  10 MG tablet Commonly known as: ZEBETA    omeprazole 20 MG capsule Commonly known as: PRILOSEC Replaced by: pantoprazole  40 MG tablet   potassium chloride  10 MEQ tablet Commonly known as: KLOR-CON        TAKE these medications    bisacodyl  5 MG EC tablet Commonly known as: DULCOLAX Take 10 mg by mouth daily as needed for moderate constipation.   Carboxymethylcellulose Sod PF 0.5 % Soln Place 1 drop into both eyes 2 (two) times daily. For dry eyes   clotrimazole 1 % cream Commonly known as: LOTRIMIN Apply 1 Application topically 2 (two) times daily. Apply a small amount to affected area twice daily for feet.   cyanocobalamin  500 MCG tablet Commonly known as: VITAMIN B12 Take 1 tablet by mouth daily.   darolutamide 300 MG tablet Commonly known  as: NUBEQA Take 600 mg by mouth 2 (two) times daily with a meal.   diclofenac Sodium 1 % Gel Commonly known as: VOLTAREN Apply 2 g topically 4 (four) times daily as needed (pain).   Eliquis  5 MG Tabs tablet Generic drug: apixaban  Take 5 mg by mouth 2 (two) times daily.   feeding supplement Liqd Take 237 mLs by mouth 2 (two) times daily  between meals.   FLUoxetine  40 MG capsule Commonly known as: PROZAC  Take 40 mg by mouth daily.   fluticasone  50 MCG/ACT nasal spray Commonly known as: FLONASE  Place 2 sprays into both nostrils daily.   gabapentin 100 MG capsule Commonly known as: NEURONTIN Take 1 capsule by mouth at bedtime.   KETOTIFEN  FUMARATE OP Apply 1 drop to eye 2 (two) times daily as needed (allergies). 0.025%   latanoprost  0.005 % ophthalmic solution Commonly known as: XALATAN  Place 1 drop into both eyes at bedtime.   metoprolol  succinate 50 MG 24 hr tablet Commonly known as: TOPROL -XL Take 0.5 tablets by mouth daily.   mirtazapine  15 MG tablet Commonly known as: REMERON  Take 0.5 tablets (7.5 mg total) by mouth at bedtime. For loss of appetite   ondansetron  8 MG tablet Commonly known as: ZOFRAN  Take 1 tablet (8 mg total) by mouth every 8 (eight) hours as needed for nausea or vomiting.   Oxycodone  HCl 10 MG Tabs Take 10 mg by mouth every 4 (four) hours as needed (severe pain).   amiodarone 200 MG tablet Commonly known as: Pacerone Take 1 tablet (200 mg total) by mouth 2 (two) times daily for 21 days. What changed: Another medication with the same name was paused. Ask your nurse or doctor if you should take this medication.   pantoprazole  40 MG tablet Commonly known as: PROTONIX  Take 1 tablet (40 mg total) by mouth 2 (two) times daily. Replaces: omeprazole 20 MG capsule   polyethylene glycol 17 g packet Commonly known as: MIRALAX  / GLYCOLAX  Take 17 g by mouth 2 (two) times daily.   potassium chloride  10 MEQ tablet Commonly known as: KLOR-CON  M Take 1 tablet (10 mEq total) by mouth every other day. What changed: when to take this   rosuvastatin 10 MG tablet Commonly known as: CRESTOR Take 0.5 tablets by mouth daily.   torsemide  20 MG tablet Commonly known as: DEMADEX  Take by mouth. Takes 20 mg in morning and 40 mg in evenings.   traZODone  50 MG tablet Commonly known as:  DESYREL  Take 50 mg by mouth at bedtime.   vitamin D3 50 MCG (2000 UT) Caps Take 2,000 Units by mouth daily.   Xtampza  ER 9 MG C12a Generic drug: oxyCODONE  ER Take 9 mg by mouth every 12 (twelve) hours.        Follow-up Information     [facility].   Contact information: [address] [place] [address] [phone]         Marco Severs, MD Follow up.   Specialty: Urology Contact information: [address]  [place] [address] [phone]                Discharge Exam: Cleavon Curls Weights   10/05/23 2040 10/06/23 0216  Weight: 96.6 kg 98.6 kg  BP (!) 128/46 (BP Location: Right Arm)   Pulse (!) 56   Temp 99.3 F (37.4 C) (Oral)   Resp 18   Ht 5\' 7"  (1.702 m)   Wt 98.6 kg   SpO2 99%  BMI 34.05 kg/m   No distress Clear, nonlabored RRR  Condition at discharge: stable  The results of significant diagnostics from this hospitalization (including imaging, microbiology, ancillary and laboratory) are listed below for reference.   Imaging Studies: ECHOCARDIOGRAM COMPLETE Result Date: 10/06/2023    ECHOCARDIOGRAM REPORT   Patient Name:   AH BOTT Date of Exam: 10/06/2023 Medical Rec #:  [id number]      Height:       67.0 in Accession #:    [id number]     Weight:       217.4 lb Date of Birth:  1945-10-27     BSA:          2.095 m Patient Age:    77 years       BP:           137/53 mmHg Patient Gender: M              HR:           57 bpm. Exam Location:  [name] Procedure: 2D Echo, Cardiac Doppler and Color Doppler (Both Spectral and Color            Flow Doppler were utilized during procedure). Indications:    Chest Pain R07.9  History:        Patient has prior history of Echocardiogram examinations, most                 recent 01/21/2023. CHF, Previous Myocardial Infarction and CAD,                 Stroke, Arrythmias:Atrial Fibrillation; Risk Factors:Sleep                 Apnea, Hypertension and  Dyslipidemia. Hx of prostate cancer.  Sonographer:    Denese Finn RCS Referring Phys: [id number] CADENCE H FURTH IMPRESSIONS  1. Left ventricular ejection fraction, by estimation, is 60 to 65%. The left ventricle has normal function. The left ventricle has no regional wall motion abnormalities. There is moderate left ventricular hypertrophy. Left ventricular diastolic parameters are consistent with Grade II diastolic dysfunction (pseudonormalization). Elevated left atrial pressure.  2. Right ventricular systolic function is normal. The right ventricular size is normal. There is moderately elevated pulmonary artery systolic pressure.  3. Left atrial size was moderately dilated.  4. Right atrial size was mildly dilated.  5. The mitral valve is abnormal. Mild mitral valve regurgitation. Moderate mitral annular calcification.  6. The tricuspid valve is abnormal.  7. The aortic valve is tricuspid. Aortic valve regurgitation is not visualized. No aortic stenosis is present.  8. The inferior vena cava is normal in size with greater than 50% respiratory variability, suggesting right atrial pressure of 3 mmHg. FINDINGS  Left Ventricle: Left ventricular ejection fraction, by estimation, is 60 to 65%. The left ventricle has normal function. The left ventricle has no regional wall motion abnormalities. The left ventricular internal cavity size was normal in size. There is  moderate left ventricular hypertrophy. Left ventricular diastolic parameters are consistent with Grade II diastolic dysfunction (pseudonormalization). Elevated left atrial pressure. Right Ventricle: The right ventricular size is normal. Right vetricular wall thickness was not well visualized. Right ventricular systolic function is normal. There is moderately elevated pulmonary artery systolic pressure. The tricuspid regurgitant velocity is 3.61 m/s, and with an assumed right atrial pressure of 3 mmHg, the estimated right ventricular systolic pressure is 55.1  mmHg. Left Atrium: Left atrial size was moderately dilated. Right Atrium: Right atrial  size was mildly dilated. Pericardium: There is no evidence of pericardial effusion. Mitral Valve: The mitral valve is abnormal. There is moderate thickening of the mitral valve leaflet(s). There is moderate calcification of the mitral valve leaflet(s). Moderate mitral annular calcification. Mild mitral valve regurgitation. The mean mitral valve gradient is 2.0 mmHg. Tricuspid Valve: The tricuspid valve is abnormal. Tricuspid valve regurgitation is mild . No evidence of tricuspid stenosis. Aortic Valve: The aortic valve is tricuspid. Aortic valve regurgitation is not visualized. No aortic stenosis is present. Aortic valve mean gradient measures 7.0 mmHg. Aortic valve peak gradient measures 13.8 mmHg. Aortic valve area, by VTI measures 1.82  cm. Pulmonic Valve: The pulmonic valve was not well visualized. Pulmonic valve regurgitation is not visualized. No evidence of pulmonic stenosis. Aorta: The aortic root is normal in size and structure. Venous: The inferior vena cava is normal in size with greater than 50% respiratory variability, suggesting right atrial pressure of 3 mmHg. IAS/Shunts: No atrial level shunt detected by color flow Doppler. Additional Comments: A device lead is visualized in the right ventricle and right atrium.  LEFT VENTRICLE PLAX 2D LVIDd:         5.00 cm   Diastology LVIDs:         3.00 cm   LV e' medial:    5.11 cm/s LV PW:         1.20 cm   LV E/e' medial:  31.3 LV IVS:        1.40 cm   LV e' lateral:   6.42 cm/s LVOT diam:     2.00 cm   LV E/e' lateral: 24.9 LV SV:         84 LV SV Index:   40 LVOT Area:     3.14 cm  RIGHT VENTRICLE RV S prime:     7.18 cm/s TAPSE (M-mode): 1.7 cm LEFT ATRIUM              Index        RIGHT ATRIUM           Index LA diam:        5.00 cm  2.39 cm/m   RA Area:     23.50 cm LA Vol (A2C):   67.2 ml  32.07 ml/m  RA Volume:   74.70 ml  35.65 ml/m LA Vol (A4C):   128.0 ml  61.09 ml/m LA Biplane Vol: 91.5 ml  43.67 ml/m  AORTIC VALVE AV Area (Vmax):    1.65 cm AV Area (Vmean):   1.78 cm AV Area (VTI):     1.82 cm AV Vmax:           186.00 cm/s AV Vmean:          122.000 cm/s AV VTI:            0.463 m AV Peak Grad:      13.8 mmHg AV Mean Grad:      7.0 mmHg LVOT Vmax:         97.50 cm/s LVOT Vmean:        69.000 cm/s LVOT VTI:          0.268 m LVOT/AV VTI ratio: 0.58  AORTA Ao Root diam: 3.40 cm MITRAL VALVE                TRICUSPID VALVE MV Area (PHT): 4.06 cm     TR Peak grad:   52.1 mmHg MV Mean grad:  2.0 mmHg  TR Vmax:        361.00 cm/s MV Decel Time: 187 msec MR Peak grad: 107.7 mmHg    SHUNTS MR Mean grad: 71.0 mmHg     Systemic VTI:  0.27 m MR Vmax:      519.00 cm/s   Systemic Diam: 2.00 cm MR Vmean:     400.0 cm/s MV E velocity: 160.00 cm/s MV A velocity: 100.00 cm/s MV E/A ratio:  1.60 Armida Lander MD Electronically signed by Armida Lander MD Signature Date/Time: 10/06/2023/4:48:25 PM    Final    US  RENAL Result Date: 10/06/2023 CLINICAL DATA:  6300 Hydronephrosis 6300 EXAM: RENAL / URINARY TRACT ULTRASOUND COMPLETE COMPARISON:  July 20, 2023 FINDINGS: Right Kidney: Renal measurements: 10.9 x 5.8 x 6.3 cm = volume: 280 mL. Normal echogenicity. No mass. No hydronephrosis or nephrolithiasis. Left Kidney: Renal measurements: 11.6 x 6.6 x 5.5 cm = volume: 221 mL. Normal echogenicity. No mass. No hydronephrosis or nephrolithiasis. Bladder: Partially distended with otherwise expected wall thickening for level of distention. The previously noted lobular mass on the prior PET-CT was not well visualized on today's ultrasound. Other: Small hilar splenule present. IMPRESSION: No hydronephrosis or nephrolithiasis. Electronically Signed   By: Rance Burrows M.D.   On: 10/06/2023 14:54   DG Chest 2 View Result Date: 10/05/2023 CLINICAL DATA:  Chest pain EXAM: CHEST - 2 VIEW COMPARISON:  02/24/2023 FINDINGS: Left AICD remains in place, unchanged. Heart and mediastinal  contours are within normal limits. Lungs are clear. No effusions. Extensive sclerotic lesions throughout the visualized skeleton compatible with sclerotic metastatic disease. IMPRESSION: No acute cardiopulmonary disease. Extensive sclerotic osseous metastases. Electronically Signed   By: Janeece Mechanic M.D.   On: 10/05/2023 21:29    Microbiology: Results for orders placed or performed in visit on 08/11/23  Microscopic Examination     Status: Abnormal   Collection Time: 08/11/23 10:47 AM   Urine  Result Value Ref Range Status   WBC, UA 6-10 (A) 0 - 5 /hpf Final   RBC, Urine 3-10 (A) 0 - 2 /hpf Final   Epithelial Cells (non renal) 0-10 0 - 10 /hpf Final   Bacteria, UA None seen None seen/Few Final    Labs: CBC: Recent Labs  Lab 10/05/23 2120  WBC 9.0  HGB 8.4*  HCT 25.7*  MCV 97.3  PLT 220   Basic Metabolic Panel: Recent Labs  Lab 10/05/23 2120 10/06/23 0713  NA 138  --   K 3.3*  --   CL 97*  --   CO2 27  --   GLUCOSE 121*  --   BUN 69*  --   CREATININE 2.46*  --   CALCIUM  8.7*  --   MG  --  2.2  PHOS  --  4.4   Liver Function Tests: No results for input(s): "AST", "ALT", "ALKPHOS", "BILITOT", "PROT", "ALBUMIN " in the last 168 hours. CBG: No results for input(s): "GLUCAP" in the last 168 hours.  Discharge time spent: greater than 30 minutes.  Signed: Wynetta Heckle, MD [facility] 10/06/2023

## 2023-10-06 NOTE — H&P (Signed)
 History and Physical    Patient: Luis Donovan:[id number] DOB: 12-07-1945 DOA: 10/05/2023 DOS: the patient was seen and examined on 10/06/2023 PCP: [facility]  Patient coming from: Home  Chief Complaint:  Chief Complaint  Patient presents with   Chest Pain    sob   HPI: Luis Donovan is a 78 y.o. male with medical history significant of hypertension, hyperlipidemia, GERD, chronic diastolic heart failure, chronic kidney disease stage IIIb, prostate cancer with bone metastasis, and atrial fibrillation on Eliquis  who presents to the emergency department due to midsternal chest pain which started yesterday.  Pain was described as burning in nature, it was persistent, nonradiating and nonreproducible.  He denies nausea and vomiting.  ED Course:  In the emergency department, respiratory rate was 22/min, other vital signs are within normal range.  Workup in the ED showed normocytic anemia.  BMP was normal except for potassium of 3.3, chloride 97, blood glucose 121, BUN/creatinine 6 9/2.46 (baseline creatinine at 1.3-1.5)..  Troponin 23 > 29. Chest x-ray showed no acute cardiopulmonary disease GI cocktail was given, aspirin 325 mg and nitroglycerin  sublingual x 1 was given.  IV hydration of 500 mL NS was provided.  TRH was asked to admit patient  Review of Systems: Review of systems as noted in the HPI. All other systems reviewed and are negative.   Past Medical History:  Diagnosis Date   Anemia    CVA (cerebral vascular accident) (HCC) 07/17/2013   Empyema (HCC)    Erectile dysfunction due to arterial insufficiency    Essential (primary) hypertension    Gastro-esophageal reflux disease with esophagitis    GERD (gastroesophageal reflux disease)    Head injury    Heart disease    Hyperlipidemia, unspecified    Hypertension    Obesity, unspecified    Pneumonia    Post-traumatic stress disorder, unspecified    Prostate cancer (HCC)    Sleep apnea, unspecified     Past Surgical History:  Procedure Laterality Date   CARDIAC DEFIBRILLATOR PLACEMENT     CYSTOSCOPY WITH BIOPSY N/A 03/11/2023   Procedure: CYSTOSCOPY WITH BIOPSY;  Surgeon: Marco Severs, MD;  Location: [facility];  Service: Urology;  Laterality: N/A;   CYSTOSCOPY WITH STENT PLACEMENT Right 03/11/2023   Procedure: CYSTOSCOPY WITH STENT PLACEMENT;  Surgeon: Marco Severs, MD;  Location: [facility];  Service: Urology;  Laterality: Right;   LUNG SURGERY     VATS   operation on skin of neck     cyst removal   ROTATOR CUFF REPAIR Left    TENDON REPAIR     TRANSURETHRAL RESECTION OF BLADDER TUMOR N/A 03/11/2023   Procedure: TRANSURETHRAL RESECTION OF BLADDER TUMOR (TURBT);  Surgeon: Marco Severs, MD;  Location: [facility];  Service: Urology;  Laterality: N/A;   VIDEO ASSISTED THORACOSCOPY      Social History:  reports that he has quit smoking. He has never used smokeless tobacco. He reports that he does not drink alcohol  and does not use drugs.   Allergies  Allergen Reactions   Docetaxel Shortness Of Breath    History reviewed. No pertinent family history.   Prior to Admission medications   Medication Sig Start Date End Date Taking? Authorizing Provider  albuterol  (VENTOLIN  HFA) 108 (90 Base) MCG/ACT inhaler Inhale 2 puffs into the lungs every 4 (four) hours as needed for wheezing or shortness of breath. 07/16/22   [provider]  amiodarone (PACERONE) 200 MG tablet Take by mouth. 03/25/23  [provider]  apixaban  (ELIQUIS ) 5 MG TABS tablet Take 5 mg by mouth 2 (two) times daily.    [provider]  bisacodyl  (DULCOLAX) 5 MG EC tablet Take 10 mg by mouth daily as needed for moderate constipation. 02/22/23   [provider]  bisoprolol  (ZEBETA ) 10 MG tablet Take 1 tablet (10 mg total) by mouth daily. 01/28/23   Johnson, Clanford L, MD  Carboxymethylcellulose Sod PF 0.5 % SOLN Place 1 drop into both eyes 2 (two) times daily. For dry eyes     [provider]  cetirizine  (ZYRTEC ) 10 MG tablet Take 1 tablet (10 mg total) by mouth daily. 06/29/23   Leath-Warren, Belen Bowers, NP  Cholecalciferol  (VITAMIN D3) 50 MCG (2000 UT) CAPS Take 2,000 Units by mouth daily.    [provider]  clotrimazole (LOTRIMIN) 1 % cream Apply 1 Application topically 2 (two) times daily. Apply a small amount to affected area twice daily for feet.    [provider]  cyanocobalamin  (VITAMIN B12) 500 MCG tablet Take 1 tablet by mouth daily. 06/17/23   [provider]  darolutamide (NUBEQA) 300 MG tablet Take by mouth. 03/30/23   [provider]  diclofenac Sodium (VOLTAREN) 1 % GEL Apply 2 g topically 4 (four) times daily as needed (pain). 07/16/22   [provider]  feeding supplement (ENSURE ENLIVE / ENSURE PLUS) LIQD Take 237 mLs by mouth 2 (two) times daily between meals. 02/26/23   Justina Oman, MD  FLUoxetine  (PROZAC ) 20 MG capsule Take 80 mg by mouth daily. 03/03/22   [provider]  fluticasone  (FLONASE ) 50 MCG/ACT nasal spray Place 2 sprays into both nostrils daily. 06/29/23   Leath-Warren, Belen Bowers, NP  gabapentin (NEURONTIN) 100 MG capsule Take 1 capsule by mouth at bedtime. 09/08/23   [provider]  hydrALAZINE  (APRESOLINE ) 50 MG tablet Take 50 mg by mouth 3 (three) times daily. 06/17/23   [provider]  HYDROcodone -acetaminophen  (NORCO) 5-325 MG tablet Take 1 tablet by mouth every 6 (six) hours as needed for moderate pain (pain score 4-6). 03/11/23   McKenzie, Arden Beck, MD  KETOTIFEN  FUMARATE OP Apply 1 drop to eye 2 (two) times daily as needed (allergies). 0.025%    [provider]  latanoprost  (XALATAN ) 0.005 % ophthalmic solution Place 1 drop into both eyes at bedtime.    [provider]  lidocaine  (LIDODERM ) 5 % Place 1 patch onto the skin daily. Apply 1 patch to skin once daily (Apply for  hours, then remove for  hours) 07/16/22   [provider]  losartan  (COZAAR ) 100 MG tablet Take 50 mg by mouth daily. 01/04/23   [provider]  metoprolol  succinate (TOPROL -XL) 50 MG 24 hr tablet Take 0.5 tablets by mouth daily. 07/30/23   [provider]  mirtazapine  (REMERON ) 15 MG tablet Take 0.5 tablets (7.5 mg total) by mouth at bedtime. For loss of appetite 02/26/23   Justina Oman, MD  Multiple Vitamin (MULTIVITAMIN WITH MINERALS) TABS tablet Take 1 tablet by mouth daily. 02/26/23   Justina Oman, MD  omeprazole (PRILOSEC) 20 MG capsule Take 20 mg by mouth 2 (two) times daily before a meal. Take on an empty stomach 30 minutes prior to a meal    [provider]  ondansetron  (ZOFRAN ) 8 MG tablet Take 1 tablet (8 mg total) by mouth every 8 (eight) hours as needed for nausea or vomiting. 01/14/23   Pickenpack-Cousar, Giles Labrum, NP  oxyCODONE  ER (XTAMPZA  ER)  9 MG C12A Take 9 mg by mouth every 12 (twelve) hours. 01/14/23   Pickenpack-Cousar, Athena N, NP  Oxycodone  HCl 10 MG TABS Take 10 mg by mouth every 4 (four) hours as needed (severe pain). 02/16/23   [provider]  polyethylene glycol (MIRALAX  / GLYCOLAX ) 17 g packet Take 17 g by mouth 2 (two) times daily. 01/13/23   Pickenpack-Cousar, Athena N, NP  potassium chloride  (KLOR-CON  M) 10 MEQ tablet Take 1 tablet (10 mEq total) by mouth every other day. 01/28/23   Johnson, Clanford L, MD  potassium chloride  (KLOR-CON ) 10 MEQ tablet Take 10 mEq by mouth daily. 06/17/23   [provider]  rosuvastatin (CRESTOR) 10 MG tablet Take 0.5 tablets by mouth daily. 04/01/23   [provider]  timolol  (TIMOPTIC -XR) 0.5 % ophthalmic gel-forming Place 1 drop into both eyes daily. 08/13/22   [provider]  torsemide  (DEMADEX ) 20 MG tablet Take by mouth. Takes 20 mg in morning and 40 mg in evenings. 04/13/23   [provider]  traZODone  (DESYREL ) 50 MG tablet Take 50 mg by mouth at bedtime. 02/16/13   [provider]    Physical  Exam: BP (!) 137/55 (BP Location: Right Arm)   Pulse (!) 53   Temp 98.4 F (36.9 C) (Oral)   Resp 18   Ht 5\' 7"  (1.702 m)   Wt 98.6 kg   SpO2 97%   BMI 34.05 kg/m   General: 78 y.o. year-old male well developed well nourished in no acute distress.  Alert and oriented x3. HEENT: NCAT, EOMI Neck: Supple, trachea medial Cardiovascular: Regular rate and rhythm with no rubs or gallops.  No thyromegaly or JVD noted.  No lower extremity edema. 2/4 pulses in all 4 extremities. Respiratory: Clear to auscultation with no wheezes or rales. Good inspiratory effort. Abdomen: Soft, nontender nondistended with normal bowel sounds x4 quadrants. Muskuloskeletal: No cyanosis, clubbing or edema noted bilaterally Neuro: CN II-XII intact, strength 5/5 x 4, sensation, reflexes intact Skin: No ulcerative lesions noted or rashes Psychiatry: Judgement and insight appear normal. Mood is appropriate for condition and setting          Labs on Admission:  Basic Metabolic Panel: Recent Labs  Lab 10/05/23 2120  NA 138  K 3.3*  CL 97*  CO2 27  GLUCOSE 121*  BUN 69*  CREATININE 2.46*  CALCIUM  8.7*   Liver Function Tests: No results for input(s): "AST", "ALT", "ALKPHOS", "BILITOT", "PROT", "ALBUMIN " in the last 168 hours. No results for input(s): "LIPASE", "AMYLASE" in the last 168 hours. No results for input(s): "AMMONIA" in the last 168 hours. CBC: Recent Labs  Lab 10/05/23 2120  WBC 9.0  HGB 8.4*  HCT 25.7*  MCV 97.3  PLT 220   Cardiac Enzymes: No results for input(s): "CKTOTAL", "CKMB", "CKMBINDEX", "TROPONINI" in the last 168 hours.  BNP (last 3 results) Recent Labs    11/03/22 0943 01/22/23 0501 02/24/23 1210  BNP 386.0* 1,290.0* 1,095.0*    ProBNP (last 3 results) No results for input(s): "PROBNP" in the last 8760 hours.  CBG: No results for input(s): "GLUCAP" in the last 168 hours.  Radiological Exams on Admission: DG Chest 2 View Result Date: 10/05/2023 CLINICAL DATA:   Chest pain EXAM: CHEST - 2 VIEW COMPARISON:  02/24/2023 FINDINGS: Left AICD remains in place, unchanged. Heart and mediastinal contours are within normal limits. Lungs are clear. No effusions. Extensive sclerotic lesions throughout the visualized skeleton compatible with sclerotic metastatic disease. IMPRESSION: No acute cardiopulmonary  disease. Extensive sclerotic osseous metastases. Electronically Signed   By: Janeece Mechanic M.D.   On: 10/05/2023 21:29    EKG: I independently viewed the EKG done and my findings are as followed: Atrial fibrillation with RVR  Assessment/Plan Present on Admission:  Atypical chest pain  Acute kidney injury superimposed on chronic kidney disease (HCC)  GERD without esophagitis  Mixed hyperlipidemia  Principal Problem:   Atypical chest pain Active Problems:   Mixed hyperlipidemia   Acute kidney injury superimposed on chronic kidney disease (HCC)   GERD without esophagitis   Elevated troponin   Hypokalemia   Chronic atrial fibrillation with RVR (HCC)  Atypical chest pain Elevated troponin Continue telemetry  Troponins x 2 - 23 > 29 EKG personally reviewed showed atrial fibrillation with RVR Cardiology will be consulted to help decide if Stress test is needed in am Versus other  diagnostic modalities.    Aspirin 324 mg x 1 and sublingual nitroglycerin  was given. 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 Observation status is judged to be reasonable and necessary in order to provide the required intensity of service to ensure the patient's safety. The patient's presenting symptoms, physical exam findings, and initial radiographic and laboratory data in the context of their medical condition is felt to place them at decreased risk for further clinical deterioration. Furthermore, it is anticipated that the patient will be medically stable for discharge from the hospital within 2 midnights of admission.   Author: Lavert Matousek, DO 10/06/2023 7:32 AM  For on call review [email or web address].

## 2023-10-06 NOTE — Progress Notes (Incomplete)
 Attending Note  Patient seen and discussed with PA Further, I agree with her documentation. 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  Reports symptoms started about 1 month ago. Pressure midchest/epigastric area up to 7/10 in severity, some associated SOB. At times can last consistently 24 hours or more. Not positional. Can have strong urge to belch, sometimes with belching symptoms improve. Can occur at rest or with activity. Has noted some increased DOE over the last few weeks but limited also due to chronic hip issues. Tried tums at home without significant benefit.     K 3.3 BUN 69 Cr 2.46 WBC 9 Hgb 8.4 Plt 220  Trop 23-->29-->41 EKG SR, chronic nonspecific ST/T changes. Subsequent EKG afib CXR no acute process   01/2023 echo: LVE 50-55%< no WMAs, indet diastolic, low normal RV function, mild to mod MR, mild to mod TR   1.Chest pain/History of CAD - prior MI in 2005 with PCI LCX -chronically elevated trops based on lab review - mild trop elevation to 41 this admisison, EKG without specific ischemic changes - echo is pending.  -symptoms are atypical on my history. Lasting 24 hours or longer constant, can be associated with strong urge to belch and often better with belching.  -in general high threshold for ishcemic testing with chronic anemia Hgb 8.4, AKI on CKD, stage IV prostate CA.  - would start PPI, f/u echo results. If benign would plan for outpatient f/u with his cardiologists at the [place].    2.PAF - history of afib - in SR on presentation, subsequent afib low 100s - on amio 200mg  daily at home, eliquis  5mg  bid - transient episodes of afib this admission, rates low 100s.  - reload oral amio, start 200mg  bid x 3 weeks then back to 200mg  daily.    3.AKI on CKD - repeat labs pending. Admit Cr was up to 2.46,  baseline 1.5 to 1.8.  - per primary team.    4. Metastatic prostate cancer - followed at [facility] MD

## 2023-10-06 NOTE — Consult Note (Addendum)
 Cardiology Consultation   Patient ID: BRAIN HONEYCUTT MRN: [id number]; DOB: 1946/02/01  Admit date: 10/05/2023 Date of Consult: 10/06/2023  PCP:  Clinic, [facility] Providers Cardiologist:  None   {     Patient Profile:   Luis Donovan is a 78 y.o. male with a hx of hypertension, CAD with prior MI PCI in 2005, VF s/p AICD,  hyperlipidemia, kidney stage III, prostate cancer with bone metastasis, OSA, prior CVA, paroxysmal A-fib on Eliquis , anemia of chronic disease who is being seen 10/06/2023 for the evaluation of chest pain at the request of Dr. Fausto Hooker.  History of Present Illness:   Luis Donovan with the above cardiac history.  [facility]  Patient had an MI with VF arrest in 2005 treated with PCI to the left circumflex.  Patient has AICD for secondary prevention followed by [facility] cardiology.  He had AICD replacement in 2015.  Patient was initially seen by [facility] heart care in September 2024 during admission, seen for A-fib RVR.  Troponin was elevated to 992.  Patient was already on anticoagulation for suspected PE.  Patient converted to normal sinus rhythm on a beta-blocker.  Patient was treated with IV heparin  for 48 hours.   suspected troponin elevation supply/demand mismatch.  No ischemic evaluation was pursued at that time.  Echo showed low normal EF of 50 to 55%, no wall motion abnormality, plus mild RVE, moderate pulmonary hypertension, mild to moderate MR, mild to moderate TR.  Hemoglobin went down to 6.9 and was transfused 1 unit.  Patient was seen in follow-up and he was sent to EP and subsequently started on amiodarone.  Patient presented to the ER Luis Donovan 10/06/2023 for chest pain and SOB. Says it started yesterday and was constant. Symptoms were worse with exertion. Chest pain a pressure in the middle of the chest. No N/V, lower leg edema, palpitations. Unsure if this is similar to CAD or Afib. Reports compliance with medications.   In  the ER blood pressure 149/103, pulse 88, respiratory rate 17, 98% O2.  EKG showed A-fib RVR, 116 bpm.  Labs showed potassium 3.3, chloride 97, blood glucose 121, serum creatinine 2.46, BUN 69.  High-sensitivity troponin 23, 29.  Chest x-ray nonacute.  Patient was given GI cocktail, aspirin 325 mg and sublingual nitro x 1.  He was also given IV fluids and admitted.   Past Medical History:  Diagnosis Date   Anemia    CVA (cerebral vascular accident) (HCC) 07/17/2013   Empyema (HCC)    Erectile dysfunction due to arterial insufficiency    Essential (primary) hypertension    Gastro-esophageal reflux disease with esophagitis    GERD (gastroesophageal reflux disease)    Head injury    Heart disease    Hyperlipidemia, unspecified    Hypertension    Obesity, unspecified    Pneumonia    Post-traumatic stress disorder, unspecified    Prostate cancer (HCC)    Sleep apnea, unspecified     Past Surgical History:  Procedure Laterality Date   CARDIAC DEFIBRILLATOR PLACEMENT     CYSTOSCOPY WITH BIOPSY N/A 03/11/2023   Procedure: CYSTOSCOPY WITH BIOPSY;  Surgeon: Marco Severs, MD;  Location: [facility];  Service: Urology;  Laterality: N/A;   CYSTOSCOPY WITH STENT PLACEMENT Right 03/11/2023   Procedure: CYSTOSCOPY WITH STENT PLACEMENT;  Surgeon: Marco Severs, MD;  Location: [facility];  Service: Urology;  Laterality: Right;   LUNG SURGERY  VATS   operation on skin of neck     cyst removal   ROTATOR CUFF REPAIR Left    TENDON REPAIR     TRANSURETHRAL RESECTION OF BLADDER TUMOR N/A 03/11/2023   Procedure: TRANSURETHRAL RESECTION OF BLADDER TUMOR (TURBT);  Surgeon: Marco Severs, MD;  Location: [facility];  Service: Urology;  Laterality: N/A;   VIDEO ASSISTED THORACOSCOPY       Home Medications:  Prior to Admission medications   Medication Sig Start Date End Date Taking? Authorizing Provider  albuterol  (VENTOLIN  HFA) 108 (90 Base) MCG/ACT inhaler Inhale 2 puffs into the lungs every  4 (four) hours as needed for wheezing or shortness of breath. 07/16/22   [provider]  amiodarone (PACERONE) 200 MG tablet Take by mouth. 03/25/23   [provider]  apixaban  (ELIQUIS ) 5 MG TABS tablet Take 5 mg by mouth 2 (two) times daily.    [provider]  bisacodyl  (DULCOLAX) 5 MG EC tablet Take 10 mg by mouth daily as needed for moderate constipation. 02/22/23   [provider]  bisoprolol  (ZEBETA ) 10 MG tablet Take 1 tablet (10 mg total) by mouth daily. 01/28/23   Johnson, Clanford L, MD  Carboxymethylcellulose Sod PF 0.5 % SOLN Place 1 drop into both eyes 2 (two) times daily. For dry eyes    [provider]  cetirizine  (ZYRTEC ) 10 MG tablet Take 1 tablet (10 mg total) by mouth daily. 06/29/23   Leath-Warren, Belen Bowers, NP  Cholecalciferol  (VITAMIN D3) 50 MCG (2000 UT) CAPS Take 2,000 Units by mouth daily.    [provider]  clotrimazole (LOTRIMIN) 1 % cream Apply 1 Application topically 2 (two) times daily. Apply a small amount to affected area twice daily for feet.    [provider]  cyanocobalamin  (VITAMIN B12) 500 MCG tablet Take 1 tablet by mouth daily. 06/17/23   [provider]  darolutamide (NUBEQA) 300 MG tablet Take by mouth. 03/30/23   [provider]  diclofenac Sodium (VOLTAREN) 1 % GEL Apply 2 g topically 4 (four) times daily as needed (pain). 07/16/22   [provider]  feeding supplement (ENSURE ENLIVE / ENSURE PLUS) LIQD Take 237 mLs by mouth 2 (two) times daily between meals. 02/26/23   Justina Oman, MD  FLUoxetine  (PROZAC ) 20 MG capsule Take 80 mg by mouth daily. 03/03/22   [provider]  fluticasone  (FLONASE ) 50 MCG/ACT nasal spray Place 2 sprays into both nostrils daily. 06/29/23   Leath-Warren, Belen Bowers, NP  gabapentin (NEURONTIN) 100 MG capsule Take 1 capsule by mouth at bedtime. 09/08/23   [provider]  hydrALAZINE  (APRESOLINE ) 50 MG tablet Take 50 mg by  mouth 3 (three) times daily. 06/17/23   [provider]  HYDROcodone -acetaminophen  (NORCO) 5-325 MG tablet Take 1 tablet by mouth every 6 (six) hours as needed for moderate pain (pain score 4-6). 03/11/23   McKenzie, Arden Beck, MD  KETOTIFEN  FUMARATE OP Apply 1 drop to eye 2 (two) times daily as needed (allergies). 0.025%    [provider]  latanoprost  (XALATAN ) 0.005 % ophthalmic solution Place 1 drop into both eyes at bedtime.    [provider]  lidocaine  (LIDODERM ) 5 % Place 1 patch onto the skin daily. Apply 1 patch to skin once daily (Apply for  hours, then remove for  hours) 07/16/22   [provider]  losartan  (COZAAR ) 100 MG tablet Take 50 mg by mouth daily. 01/04/23   [provider]  metoprolol  succinate (  TOPROL -XL) 50 MG 24 hr tablet Take 0.5 tablets by mouth daily. 07/30/23   [provider]  mirtazapine  (REMERON ) 15 MG tablet Take 0.5 tablets (7.5 mg total) by mouth at bedtime. For loss of appetite 02/26/23   Justina Oman, MD  Multiple Vitamin (MULTIVITAMIN WITH MINERALS) TABS tablet Take 1 tablet by mouth daily. 02/26/23   Justina Oman, MD  omeprazole (PRILOSEC) 20 MG capsule Take 20 mg by mouth 2 (two) times daily before a meal. Take on an empty stomach 30 minutes prior to a meal    [provider]  ondansetron  (ZOFRAN ) 8 MG tablet Take 1 tablet (8 mg total) by mouth every 8 (eight) hours as needed for nausea or vomiting. 01/14/23   Pickenpack-Cousar, Giles Labrum, NP  oxyCODONE  ER (XTAMPZA  ER) 9 MG C12A Take 9 mg by mouth every 12 (twelve) hours. 01/14/23   Pickenpack-Cousar, Athena N, NP  Oxycodone  HCl 10 MG TABS Take 10 mg by mouth every 4 (four) hours as needed (severe pain). 02/16/23   [provider]  polyethylene glycol (MIRALAX  / GLYCOLAX ) 17 g packet Take 17 g by mouth 2 (two) times daily. 01/13/23   Pickenpack-Cousar, Athena N, NP  potassium chloride  (KLOR-CON  M) 10 MEQ tablet Take 1 tablet (10 mEq total) by  mouth every other day. 01/28/23   Johnson, Clanford L, MD  potassium chloride  (KLOR-CON ) 10 MEQ tablet Take 10 mEq by mouth daily. 06/17/23   [provider]  rosuvastatin (CRESTOR) 10 MG tablet Take 0.5 tablets by mouth daily. 04/01/23   [provider]  timolol  (TIMOPTIC -XR) 0.5 % ophthalmic gel-forming Place 1 drop into both eyes daily. 08/13/22   [provider]  torsemide  (DEMADEX ) 20 MG tablet Take by mouth. Takes 20 mg in morning and 40 mg in evenings. 04/13/23   [provider]  traZODone  (DESYREL ) 50 MG tablet Take 50 mg by mouth at bedtime. 02/16/13   [provider]    Inpatient Medications: Scheduled Meds:  potassium chloride   40 mEq Oral Once   Continuous Infusions:  lactated ringers      PRN Meds: acetaminophen  **OR** acetaminophen , nitroGLYCERIN , ondansetron  **OR** ondansetron  (ZOFRAN ) IV  Allergies:    Allergies  Allergen Reactions   Docetaxel Shortness Of Breath    Social History:   Social History   Socioeconomic History   Marital status: Married    Spouse name: beverly   Number of children: 4   Years of education: Not on file   Highest education level: Not on file  Occupational History   Occupation: retired    Associate Professor: RETIRED  Tobacco Use   Smoking status: Former   Smokeless tobacco: Never  Advertising account planner   Vaping status: Never Used  Substance and Sexual Activity   Alcohol  use: No   Drug use: No   Sexual activity: Never    Birth control/protection: None  Other Topics Concern   Not on file  Social History Narrative   Not on file   Social Drivers of Health   Financial Resource Strain: Not on file  Food Insecurity: No Food Insecurity (10/06/2023)   Hunger Vital Sign    Worried About Running Out of Food in the Last Year: Never true    Ran Out of Food in the Last Year: Never true  Transportation Needs: No Transportation Needs (10/06/2023)   PRAPARE - Administrator, Civil Service (Medical): No     Lack of Transportation (Non-Medical): No  Physical Activity: Not on file  Stress: Not  on file  Social Connections: Not on file  Intimate Partner Violence: Not At Risk (10/06/2023)   Humiliation, Afraid, Rape, and Kick questionnaire    Fear of Current or Ex-Partner: No    Emotionally Abused: No    Physically Abused: No    Sexually Abused: No    Family History:   History reviewed. No pertinent family history.   ROS:  Please see the history of present illness.   All other ROS reviewed and negative.     Physical Exam/Data:   Vitals:   10/06/23 0116 10/06/23 0216 10/06/23 0323 10/06/23 0531  BP:  (!) 157/89 (!) 142/60 (!) 137/55  Pulse:  91 (!) 57 (!) 53  Resp:  19 18 18   Temp: 98.9 F (37.2 C) 98.1 F (36.7 C)  98.4 F (36.9 C)  TempSrc: Oral Oral  Oral  SpO2:  100% 95% 97%  Weight:  98.6 kg    Height:       No intake or output data in the 24 hours ending 10/06/23 0737    10/06/2023    2:16 AM 10/05/2023    8:40 PM 04/26/2023    8:23 AM  Last 3 Weights  Weight (lbs) 217 lb 6 oz 213 lb 201 lb 4 oz  Weight (kg) 98.6 kg 96.616 kg 91.286 kg     Body mass index is 34.05 kg/m.  General:  Well nourished, well developed, in no acute distress HEENT: normal Neck: no JVD Vascular: No carotid bruits; Distal pulses 2+ bilaterally Cardiac:  normal S1, S2; bradycardia, RR; no murmur  Lungs:  clear to auscultation bilaterally, no wheezing, rhonchi or rales  Abd: soft, nontender, no hepatomegaly  Ext: no edema Musculoskeletal:  No deformities, BUE and BLE strength normal and equal Skin: warm and dry  Neuro:  CNs 2-12 intact, no focal abnormalities noted Psych:  Normal affect   EKG:  The EKG was personally reviewed and demonstrates:  Afib 102bpm, repol abnormality Telemetry:  Telemetry was personally reviewed and demonstrates:  Afib rates around 100 >> SB ~ 2:30 AM HR 40-50s  Relevant CV Studies:   Transthoracic echocardiogram 01/21/2023, [place] 1. Left ventricular  ejection fraction, by estimation, is 50 to 55%. The left  ventricle has low normal function. The left ventricle has no regional wall  motion abnormalities. There is mild left ventricular hypertrophy. Left  ventricular diastolic parameters are indeterminate. 2. Right ventricular systolic function is low normal. The right ventricular size  is mildly enlarged. There is moderately elevated pulmonary artery systolic  pressure. 3. Left atrial size was moderately dilated. 4. The mitral valve is abnormal. Mild to moderate mitral valve regurgitation. No  evidence of mitral stenosis. 5. The tricuspid valve is abnormal. Tricuspid valve regurgitation is mild to  moderate. 6. The aortic valve is tricuspid. Aortic valve regurgitation is not visualized.  No aortic stenosis is present. 7. The pulmonic valve was abnormal. 8. The inferior vena cava is dilated in size with <50% respiratory variability,  suggesting right atrial pressure of 15 mmHg.   Transthoracic echocardiogram 01/07/2023 The left ventricle is normal in size. There is normal left ventricular wall thickness. Left ventricular systolic function is low normal. EF= 50-55% by 2D biplane method. Mid-posteriolateral wall moderate hypokinesis. Grade I diastolic LV dysfunction with mildly elevated LV filling pressures (E/e'-average=15.2) Upper-normal RV size with normal systolic function. There is a cardiac device lead in the right ventricle. The left atrium is mildly dilated. The right atrium is mildly dilated. Aortic valve is trileaflet,  mildly sclerotic and opens normally. No hemodynamically significant valvular aortic stenosis. Trace to mild aortic regurgitation. Mild pulmonic valvular regurgitation. There is mild prolapse of the posterior mitral valve leaflet. No hemodynamically significant mitral stenosis. Mean transmitral gradient upper-normal at 4 mm Hg. There is moderate mitral regurgitation. There is mild tricuspid  regurgitation. Right ventricular systolic pressure unable to be evaluated due to insufficient TR. The IVC is normal in size with an inspiratory collapse of greater then 50%, suggesting normal right atrial pressure. The proximal ascending aorta is not well visualized. There is no pericardial effusion. Comment: patient was known to have had total left circumflex occlusion since 2005. Compared to previous echocardiographic report of 03/20/2020, previous study reported mild-to-moderate mitral regurgitaiton, LVEF of 52%, no   19 Sep 2015. COMPLETE 2-D ECHOCARDIOGRAM with Spectral and Color Doppler was  performed. Study Quality: poor A contrast injection of Definity was performed to improve assessment of LV  function. 1. The left ventricle is normal in size. Left ventricular systolic function is  normal. Ejection Fraction = 55-60% (Visual Estimation). No regional wall motion  abnormalities noted. No diastolic dysfunction parameters to suggest elevated  left atrial pressure or congestive heart failure. There is mild concentric left  ventricular hypertrophy. 2. The right ventricle is normal in size and function. ICD lead in RV 3. There is mild mitral regurgitation. 4. There is borderline pulmonary hypertension. Right ventricular systolic  pressure is estimated at 31-36 mmHg.   Laboratory Data:  High Sensitivity Troponin:   Recent Labs  Lab 10/05/23 2120 10/05/23 2237  TROPONINIHS 23* 29*     Chemistry Recent Labs  Lab 10/05/23 2120  NA 138  K 3.3*  CL 97*  CO2 27  GLUCOSE 121*  BUN 69*  CREATININE 2.46*  CALCIUM  8.7*  GFRNONAA 27*  ANIONGAP 10    No results for input(s): "PROT", "ALBUMIN ", "AST", "ALT", "ALKPHOS", "BILITOT" in the last 168 hours. Lipids No results for input(s): "CHOL", "TRIG", "HDL", "LABVLDL", "LDLCALC", "CHOLHDL" in the last 168 hours.  Hematology Recent Labs  Lab 10/05/23 2120  WBC 9.0  RBC 2.64*  HGB 8.4*  HCT 25.7*  MCV 97.3  MCH 31.8   MCHC 32.7  RDW 14.4  PLT 220   Thyroid No results for input(s): "TSH", "FREET4" in the last 168 hours.  BNPNo results for input(s): "BNP", "PROBNP" in the last 168 hours.  DDimer No results for input(s): "DDIMER" in the last 168 hours.   Radiology/Studies:  DG Chest 2 View Result Date: 10/05/2023 CLINICAL DATA:  Chest pain EXAM: CHEST - 2 VIEW COMPARISON:  02/24/2023 FINDINGS: Left AICD remains in place, unchanged. Heart and mediastinal contours are within normal limits. Lungs are clear. No effusions. Extensive sclerotic lesions throughout the visualized skeleton compatible with sclerotic metastatic disease. IMPRESSION: No acute cardiopulmonary disease. Extensive sclerotic osseous metastases. Electronically Signed   By: Janeece Mechanic M.D.   On: 10/05/2023 21:29     Assessment and Plan:   Chest pain Elevated troponin CAD s/p remote PCI - presented with chest pain with typical and atypical features, found to have Afib RVR and mildly elevated trop - HS trop 23>29>41, flat and not consistent with ACS - EKG showed Afib RVR 102bpm with repol abnormality - given ASA 324mg  and SL NTG x1 with improvement of pain - suspect supply demand mismatch in the setting of afib with elevated rates - no ASA given Eliquis . Continue Crestor - check an echo. If this is normal , can consider outpatient stress test. Patient  may want to undergo stress test here since he is concerned [facility] may take a [facility]  Afib RVR - h/o PAF on Eliquis , amiodarone and metoprolol  followed by the [place] - appears he is normally in SB on chart review - on presentation afib rate up to 116bpm - converted to SB at 2:30 AM - continue PTA Eliquis  - Keep Mag>2 and K>3 - check TSH - restarted on amiodarone 200mg  daily -  he is not maintaining NSR with amiodarone. Would revisit AA with EP at the [place]. Would hold on restarting BB given bradycardia  AKI on CKD IV - Scr 2.46 on admission - baseline 1.3-1.5 - torsemide   held - s/p  IVF - AM labs pending  HFpEF - Echo 01/2023 showed LVEF 50-55%, no WMA, mild LVH, low normal RVSF, moderately elevated pulmonary artery systolic pressure, mild to mod MR, mild to mod TR - patient is euvolemic - PTA torsemide  20mg  in the AM and 40mg  in the evening>held for AKI - may need torsemide  20mg  BID given AKI  HTN - PTA  hydralazine  50mg  TID, Losartan  50mg  daily and toprol  25mg  daily>held - losartan  50mg  restarted, monitor SCr - BP elevated, can restart hydralazine   VF s/p  AICD - [facility] the [facility]  For questions or updates, please contact [facility] Please consult [email or web address] for contact info under    Signed, Cadence Rebekah Canada, PA-C  10/06/2023 7:37 AM   Reports symptoms started about 1 month ago. Pressure midchest/epigastric area up to 7/10 in severity, some associated SOB. At times can last consistently 24 hours or more. Not positional. Can have strong urge to belch, sometimes with belching symptoms improve. Can occur at rest or with activity. Has noted some increased DOE over the last few weeks but limited also due to chronic hip issues. Tried tums at home without significant benefit.     K 3.3 BUN 69 Cr 2.46 WBC 9 Hgb 8.4 Plt 220  Trop 23-->29-->41 EKG SR, chronic nonspecific ST/T changes. Subsequent EKG afib CXR no acute process   01/2023 echo: LVE 50-55%< no WMAs, indet diastolic, low normal RV function, mild to mod MR, mild to mod TR   1.Chest pain/History of CAD - prior MI in 2005 with PCI LCX -chronically elevated trops based on lab review - mild trop elevation to 41 this admisison, EKG without specific ischemic changes - echo is pending.  -symptoms are atypical on my history. Lasting 24 hours or longer constant, can be associated with strong urge to belch and often better with belching.  -in general high threshold for ishcemic testing with chronic anemia Hgb 8.4, AKI on CKD GFR 27 on admission , stage IV prostate CA.  - would  start PPI, f/u echo results. If benign would plan for outpatient f/u with his cardiologists at the [place].    2.PAF - history of afib - in SR on presentation, subsequent afib low 100s - on amio 200mg  daily at home, eliquis  5mg  bid - transient episodes of afib this admission, rates low 100s.  - reload oral amio, start 200mg  bid x 3 weeks then back to 200mg  daily.    3.AKI on CKD - repeat labs pending. Admit Cr was up to 2.46, baseline 1.5 to 1.8.  - per primary team.    4. Metastatic prostate cancer - followed at [facility] - unclear prognosis from available data we have.    Armida Lander MD

## 2023-10-06 NOTE — Progress Notes (Signed)
 Pt discharged via WC to POV with all personal belongings in his wife's possession.

## 2023-10-06 NOTE — TOC Progression Note (Signed)
 Transition of Care [facility]) - Progression Note    Patient Details  Name: Luis Donovan MRN: [id number] Date of Birth: [birth year]  [facility] CM/SW Contact  Orelia Binet, RN Phone Number: 10/06/2023, 1:32 PM  Clinical Narrative:     Transition of Care [facility]) - Inpatient Brief Assessment   Patient Details  Name: Luis Donovan MRN: [id number] Date of Birth: 02-15-1946  Transition of Care [facility]) CM/SW Contact:    Orelia Binet, RN Phone Number: 10/06/2023, 1:33 PM   Clinical Narrative: Patient in OBS for chest Pain. [facility]  [phone]. Echo pending, cardiology following. Discharging home possibly later today.    Transition of Care Asessment: Insurance and Status: Insurance coverage has been reviewed Patient has primary care physician: Yes Home environment has been reviewed: Home with spouse Prior level of function:: Independent Prior/Current Home Services: No current home services Social Drivers of Health Review: SDOH reviewed no interventions necessary Readmission risk has been reviewed: Yes Transition of care needs: no transition of care needs at this time        Expected Discharge Plan and Services     Home  Social Determinants of Health (SDOH) Interventions SDOH Screenings   Food Insecurity: No Food Insecurity (10/06/2023)  Housing: Low Risk  (10/06/2023)  Transportation Needs: No Transportation Needs (10/06/2023)  Utilities: Not At Risk (10/06/2023)  Alcohol  Screen: Low Risk  (04/26/2023)  Depression (PHQ2-9): Low Risk  (04/26/2023)  Tobacco Use: Medium Risk (10/05/2023)    Readmission Risk Interventions    02/26/2023    9:38 AM 01/18/2023   11:34 AM 12/14/2022   10:25 AM  Readmission Risk Prevention Plan  Transportation Screening Complete Complete Complete  HRI or Home Care Consult   Complete  Social Work Consult for Recovery Care Planning/Counseling   Complete  Palliative Care Screening   Complete  Medication  Review Oceanographer) Complete Complete Complete  HRI or Home Care Consult Complete Complete   SW Recovery Care/Counseling Consult Complete Complete   Palliative Care Screening Not Applicable Not Applicable   Skilled Nursing Facility Not Applicable Not Applicable

## 2023-10-06 NOTE — Progress Notes (Addendum)
*  PRELIMINARY RESULTS* Echocardiogram 2D Echocardiogram has been performed.  Luis Donovan 10/06/2023, 4:36 PM

## 2023-10-12 ENCOUNTER — Encounter: Payer: Self-pay | Admitting: Gastroenterology

## 2023-10-12 ENCOUNTER — Telehealth: Payer: Self-pay | Admitting: *Deleted

## 2023-10-12 ENCOUNTER — Ambulatory Visit: Admitting: Gastroenterology

## 2023-10-12 VITALS — BP 121/68 | HR 59 | Temp 97.8°F | Ht 67.0 in | Wt 271.8 lb

## 2023-10-12 DIAGNOSIS — Z1211 Encounter for screening for malignant neoplasm of colon: Secondary | ICD-10-CM | POA: Insufficient documentation

## 2023-10-12 DIAGNOSIS — K5903 Drug induced constipation: Secondary | ICD-10-CM

## 2023-10-12 DIAGNOSIS — K219 Gastro-esophageal reflux disease without esophagitis: Secondary | ICD-10-CM

## 2023-10-12 DIAGNOSIS — R1310 Dysphagia, unspecified: Secondary | ICD-10-CM | POA: Diagnosis not present

## 2023-10-12 DIAGNOSIS — R1013 Epigastric pain: Secondary | ICD-10-CM | POA: Diagnosis not present

## 2023-10-12 DIAGNOSIS — D649 Anemia, unspecified: Secondary | ICD-10-CM | POA: Diagnosis not present

## 2023-10-12 DIAGNOSIS — R1319 Other dysphagia: Secondary | ICD-10-CM

## 2023-10-12 DIAGNOSIS — T50905A Adverse effect of unspecified drugs, medicaments and biological substances, initial encounter: Secondary | ICD-10-CM

## 2023-10-12 NOTE — Progress Notes (Unsigned)
 GI Office Note    Referring Provider: [facility] Primary Care Physician:  [facility]  Primary Gastroenterologist:  Chief Complaint   Chief Complaint  Patient presents with   Gastroesophageal Reflux    Having epigastric burning and states that it feels like he has to burp     History of Present Illness   Luis Donovan is a 78 y.o. male presenting today for at the request of the [place] for colon cancer screening, gerd, anemia.  Recent admission for chest pain, with atypical features, found to have Afib RVR and mildly elevated trop, not c/w with ACS. Suspected supply demand mismatch in setting of afib with elevated rates. Cardiology suggested PPI. F/U ECHO. LVEF 60-65%, moderate left ventricular hypertrophy. Left ventricular Grade II diastolic dysfunction. Moderately elevated pulmonary artery systolic pressure. Mild MR.  Admission for abd pain in 2024. Work up noted acute cholecystitis with poor GBEF, patient denied surgery consultation. Elevated AP improved. Seen by GI while inpatient. CT scan and US  with normal liver and sludege in GB with patent cystic and CBD. GERD better on omeprazole BID. Weight loss of 25 pounds since 11/2022 started after dx of either prostat/bladder CA. Anemis with blood transfusion while hospitalized. PE s/p hospitalizaitn with recent start of apixaban  01/2023. Metastatic prostate cancer.  Constipation.   Maalox Pantoprazole  40mg  BID. Feels like food sticks/ like needs to burp. Miralax .17 g daily. BM every day to every other day No melena, brbpr.   [facility] Avis Boehringer.   Reflux more than five years         Seen by [facility] and planned for EGD/colonoscopy.   Colonoscopy 2015: diverticulosis. Repeat in 10 years.    GERD/dysphagia: resolved on omeprazole BID. No prior EGD. Requesting EGD for Barrett's screening.    Prostate/bladder cancer. Metastatic prostate cancer, involving the bladder, unable to  tolerate chemo. Back to home therapy.    Acute anemia, given blood while in hospital. Ferritin "extremely elevated, but TIBC, iron, iron sat normal". Hgb still low.    PE with start of eliquis  01/2023.    Moderate MR on ECHO 2024.  25 pound with loass in one year since diagnosied with cancer Constipation, miralax    Htn, hld, cae s/p arrest and stent in 2005, ICD 2009, replacement in 2015, OSA on CPAP, CKD, PTSD, PE on Eliquis , shoulder surg, left elbow, CVA, Afib with RVR Moviprep .   Labs 08/2023: Hgb 10.5, Hct 30.9, MCV 94.2, Platelet 242, alb 4.4, Tbili 0.5, AP 187H, AST 47H, ALT 41, ANA neg, actin IgG 4, neg, AMA, neg, iron 150, TIBC 264, iron sat 57, ferritin 4258H, Hep A IgM neg, HbsAg neg, ep B core IgM, neg, HBC DNA ned, HCV RNA neg, Hemochromatosis markers nega     CT chest/abd/pelvis angiography 11/2022: celiac, SMA patent. IMA not well seen. Gallbladder unremarkable. Spleen normlal. Liver normal. No bowel wall thickening. Mild mediastinal adenompathy with extensive periaortic and right iliac adnopathy most c/w metastatic disease or possibly lymphoma. Right inguinal adenopaty. Osseous metastases. Mild right hydroureteronephrosis appears to b due to occlusion from irregular mass invoving the posterior portion of the urinary bladder in region of right ureterovesical junction.    GB US : sludge noted, mild wall thickening, measuring 5mm, no murphhy sign.    HIDA: GBEF 7   ECHO: LVEF 50-55%, mild to moderate MR    Medications   Current Outpatient Medications  Medication Sig Dispense Refill   apixaban  (ELIQUIS ) 5 MG TABS tablet  Take 5 mg by mouth 2 (two) times daily.     bisacodyl  (DULCOLAX) 5 MG EC tablet Take 10 mg by mouth daily as needed for moderate constipation.     Carboxymethylcellulose Sod PF 0.5 % SOLN Place 1 drop into both eyes 2 (two) times daily. For dry eyes     Cholecalciferol  (VITAMIN D3) 50 MCG (2000 UT) CAPS Take 2,000 Units by mouth daily.     clotrimazole  (LOTRIMIN) 1 % cream Apply 1 Application topically 2 (two) times daily. Apply a small amount to affected area twice daily for feet.     cyanocobalamin  (VITAMIN B12) 500 MCG tablet Take 1 tablet by mouth daily.     darolutamide (NUBEQA) 300 MG tablet Take 600 mg by mouth 2 (two) times daily with a meal.     diclofenac Sodium (VOLTAREN) 1 % GEL Apply 2 g topically 4 (four) times daily as needed (pain).     feeding supplement (ENSURE ENLIVE / ENSURE PLUS) LIQD Take 237 mLs by mouth 2 (two) times daily between meals.     FLUoxetine  (PROZAC ) 40 MG capsule Take 40 mg by mouth daily.     fluticasone  (FLONASE ) 50 MCG/ACT nasal spray Place 2 sprays into both nostrils daily. 16 g 0   gabapentin (NEURONTIN) 100 MG capsule Take 1 capsule by mouth at bedtime.     KETOTIFEN  FUMARATE OP Apply 1 drop to eye 2 (two) times daily as needed (allergies). 0.025%     latanoprost  (XALATAN ) 0.005 % ophthalmic solution Place 1 drop into both eyes at bedtime.     metoprolol  succinate (TOPROL -XL) 50 MG 24 hr tablet Take 0.5 tablets by mouth daily.     mirtazapine  (REMERON ) 15 MG tablet Take 0.5 tablets (7.5 mg total) by mouth at bedtime. For loss of appetite     ondansetron  (ZOFRAN ) 8 MG tablet Take 1 tablet (8 mg total) by mouth every 8 (eight) hours as needed for nausea or vomiting. 30 tablet 2   oxyCODONE  ER (XTAMPZA  ER) 9 MG C12A Take 9 mg by mouth every 12 (twelve) hours. 30 capsule 0   Oxycodone  HCl 10 MG TABS Take 10 mg by mouth every 4 (four) hours as needed (severe pain).     pantoprazole  (PROTONIX ) 40 MG tablet Take 1 tablet (40 mg total) by mouth 2 (two) times daily. 60 tablet 0   polyethylene glycol (MIRALAX  / GLYCOLAX ) 17 g packet Take 17 g by mouth 2 (two) times daily. 14 each 0   potassium chloride  (KLOR-CON  M) 10 MEQ tablet Take 1 tablet (10 mEq total) by mouth every other day. (Patient taking differently: Take 10 mEq by mouth daily.) 15 tablet 1   rosuvastatin (CRESTOR) 10 MG tablet Take 0.5 tablets by mouth  daily.     torsemide  (DEMADEX ) 20 MG tablet Take by mouth. Takes 20 mg in morning and 40 mg in evenings.     traZODone  (DESYREL ) 50 MG tablet Take 50 mg by mouth at bedtime.     [Paused] amiodarone (PACERONE) 200 MG tablet Take 200 mg by mouth daily. (Patient not taking: Reported on 10/12/2023)     amiodarone (PACERONE) 200 MG tablet Take 1 tablet (200 mg total) by mouth 2 (two) times daily for 21 days. (Patient not taking: Reported on 10/12/2023) 42 tablet 0   No current facility-administered medications for this visit.    Allergies   Allergies as of 10/12/2023 - Review Complete 10/12/2023  Allergen Reaction Noted   Docetaxel Shortness Of Breath 05/26/2023  Past Medical History   Past Medical History:  Diagnosis Date   Anemia    CVA (cerebral vascular accident) (HCC) 07/17/2013   Empyema (HCC)    Erectile dysfunction due to arterial insufficiency    Essential (primary) hypertension    Gastro-esophageal reflux disease with esophagitis    GERD (gastroesophageal reflux disease)    Head injury    Heart disease    Hyperlipidemia, unspecified    Hypertension    Obesity, unspecified    Pneumonia    Post-traumatic stress disorder, unspecified    Prostate cancer (HCC)    Sleep apnea, unspecified     Past Surgical History   Past Surgical History:  Procedure Laterality Date   CARDIAC DEFIBRILLATOR PLACEMENT     CYSTOSCOPY WITH BIOPSY N/A 03/11/2023   Procedure: CYSTOSCOPY WITH BIOPSY;  Surgeon: Marco Severs, MD;  Location: [facility];  Service: Urology;  Laterality: N/A;   CYSTOSCOPY WITH STENT PLACEMENT Right 03/11/2023   Procedure: CYSTOSCOPY WITH STENT PLACEMENT;  Surgeon: Marco Severs, MD;  Location: [facility];  Service: Urology;  Laterality: Right;   LUNG SURGERY     VATS   operation on skin of neck     cyst removal   ROTATOR CUFF REPAIR Left    TENDON REPAIR     TRANSURETHRAL RESECTION OF BLADDER TUMOR N/A 03/11/2023   Procedure: TRANSURETHRAL RESECTION OF  BLADDER TUMOR (TURBT);  Surgeon: Marco Severs, MD;  Location: [facility];  Service: Urology;  Laterality: N/A;   VIDEO ASSISTED THORACOSCOPY      Past Family History   History reviewed. No pertinent family history.  Past Social History   Social History   Socioeconomic History   Marital status: Married    Spouse name: beverly   Number of children: 4   Years of education: Not on file   Highest education level: Not on file  Occupational History   Occupation: retired    Associate Professor: RETIRED  Tobacco Use   Smoking status: Former   Smokeless tobacco: Never  Advertising account planner   Vaping status: Never Used  Substance and Sexual Activity   Alcohol  use: No   Drug use: No   Sexual activity: Never    Birth control/protection: None  Other Topics Concern   Not on file  Social History Narrative   Not on file   Social Drivers of Health   Financial Resource Strain: Not on file  Food Insecurity: No Food Insecurity (10/06/2023)   Hunger Vital Sign    Worried About Running Out of Food in the Last Year: Never true    Ran Out of Food in the Last Year: Never true  Transportation Needs: No Transportation Needs (10/06/2023)   PRAPARE - Administrator, Civil Service (Medical): No    Lack of Transportation (Non-Medical): No  Physical Activity: Not on file  Stress: Not on file  Social Connections: Not on file  Intimate Partner Violence: Not At Risk (10/06/2023)   Humiliation, Afraid, Rape, and Kick questionnaire    Fear of Current or Ex-Partner: No    Emotionally Abused: No    Physically Abused: No    Sexually Abused: No    Review of Systems   General: Negative for anorexia, weight loss, fever, chills, fatigue, weakness. Eyes: Negative for vision changes.  ENT: Negative for hoarseness, difficulty swallowing , nasal congestion. CV: Negative for chest pain, angina, palpitations, dyspnea on exertion, peripheral edema.  Respiratory: Negative for dyspnea at rest, dyspnea on exertion,  cough,  sputum, wheezing.  GI: See history of present illness. GU:  Negative for dysuria, hematuria, urinary incontinence, urinary frequency, nocturnal urination.  MS: Negative for joint pain, low back pain.  Derm: Negative for rash or itching.  Neuro: Negative for weakness, abnormal sensation, seizure, frequent headaches, memory loss,  confusion.  Psych: Negative for anxiety, depression, suicidal ideation, hallucinations.  Endo: Negative for unusual weight change.  Heme: Negative for bruising or bleeding. Allergy: Negative for rash or hives.  Physical Exam   BP 121/68 (BP Location: Right Arm, Patient Position: Sitting, Cuff Size: Large)   Pulse (!) 59   Temp 97.8 F (36.6 C) (Oral)   Ht 5\' 7"  (1.702 m)   Wt 271 lb 12.8 oz (123.3 kg)   SpO2 100%   BMI 42.57 kg/m    General: Well-nourished, well-developed in no acute distress.  Head: Normocephalic, atraumatic.   Eyes: Conjunctiva pink, no icterus. Mouth: Oropharyngeal mucosa moist and pink , no lesions erythema or exudate. Neck: Supple without thyromegaly, masses, or lymphadenopathy.  Lungs: Clear to auscultation bilaterally.  Heart: Regular rate and rhythm, no murmurs rubs or gallops.  Abdomen: Bowel sounds are normal, nontender, nondistended, no hepatosplenomegaly or masses,  no abdominal bruits or hernia, no rebound or guarding.   Rectal: *** Extremities: No lower extremity edema. No clubbing or deformities.  Neuro: Alert and oriented x 4 , grossly normal neurologically.  Skin: Warm and dry, no rash or jaundice.   Psych: Alert and cooperative, normal mood and affect.  Labs   *** Imaging Studies   ECHOCARDIOGRAM COMPLETE Result Date: 10/06/2023    ECHOCARDIOGRAM REPORT   Patient Name:   BETTIE SWAVELY Date of Exam: 10/06/2023 Medical Rec #:  [id number]      Height:       67.0 in Accession #:    [id number]     Weight:       217.4 lb Date of Birth:  11-Dec-1945     BSA:          2.095 m Patient Age:    77 years       BP:            137/53 mmHg Patient Gender: M              HR:           57 bpm. Exam [name] Procedure: 2D Echo, Cardiac Doppler and Color Doppler (Both Spectral and Color            Flow Doppler were utilized during procedure). Indications:    Chest Pain R07.9  History:        Patient has prior history of Echocardiogram examinations, most                 recent 01/21/2023. CHF, Previous Myocardial Infarction and CAD,                 Stroke, Arrythmias:Atrial Fibrillation; Risk Factors:Sleep                 Apnea, Hypertension and Dyslipidemia. Hx of prostate cancer.  Sonographer:    Denese Finn RCS Referring Phys: [id number] CADENCE H FURTH IMPRESSIONS  1. Left ventricular ejection fraction, by estimation, is 60 to 65%. The left ventricle has normal function. The left ventricle has no regional wall motion abnormalities. There is moderate left ventricular hypertrophy. Left ventricular diastolic parameters are consistent with Grade II diastolic dysfunction (pseudonormalization). Elevated left atrial pressure.  2. Right ventricular systolic  function is normal. The right ventricular size is normal. There is moderately elevated pulmonary artery systolic pressure.  3. Left atrial size was moderately dilated.  4. Right atrial size was mildly dilated.  5. The mitral valve is abnormal. Mild mitral valve regurgitation. Moderate mitral annular calcification.  6. The tricuspid valve is abnormal.  7. The aortic valve is tricuspid. Aortic valve regurgitation is not visualized. No aortic stenosis is present.  8. The inferior vena cava is normal in size with greater than 50% respiratory variability, suggesting right atrial pressure of 3 mmHg. FINDINGS  Left Ventricle: Left ventricular ejection fraction, by estimation, is 60 to 65%. The left ventricle has normal function. The left ventricle has no regional wall motion abnormalities. The left ventricular internal cavity size was normal in size. There is  moderate left ventricular  hypertrophy. Left ventricular diastolic parameters are consistent with Grade II diastolic dysfunction (pseudonormalization). Elevated left atrial pressure. Right Ventricle: The right ventricular size is normal. Right vetricular wall thickness was not well visualized. Right ventricular systolic function is normal. There is moderately elevated pulmonary artery systolic pressure. The tricuspid regurgitant velocity is 3.61 m/s, and with an assumed right atrial pressure of 3 mmHg, the estimated right ventricular systolic pressure is 55.1 mmHg. Left Atrium: Left atrial size was moderately dilated. Right Atrium: Right atrial size was mildly dilated. Pericardium: There is no evidence of pericardial effusion. Mitral Valve: The mitral valve is abnormal. There is moderate thickening of the mitral valve leaflet(s). There is moderate calcification of the mitral valve leaflet(s). Moderate mitral annular calcification. Mild mitral valve regurgitation. The mean mitral valve gradient is 2.0 mmHg. Tricuspid Valve: The tricuspid valve is abnormal. Tricuspid valve regurgitation is mild . No evidence of tricuspid stenosis. Aortic Valve: The aortic valve is tricuspid. Aortic valve regurgitation is not visualized. No aortic stenosis is present. Aortic valve mean gradient measures 7.0 mmHg. Aortic valve peak gradient measures 13.8 mmHg. Aortic valve area, by VTI measures 1.82  cm. Pulmonic Valve: The pulmonic valve was not well visualized. Pulmonic valve regurgitation is not visualized. No evidence of pulmonic stenosis. Aorta: The aortic root is normal in size and structure. Venous: The inferior vena cava is normal in size with greater than 50% respiratory variability, suggesting right atrial pressure of 3 mmHg. IAS/Shunts: No atrial level shunt detected by color flow Doppler. Additional Comments: A device lead is visualized in the right ventricle and right atrium.  LEFT VENTRICLE PLAX 2D LVIDd:         5.00 cm   Diastology LVIDs:          3.00 cm   LV e' medial:    5.11 cm/s LV PW:         1.20 cm   LV E/e' medial:  31.3 LV IVS:        1.40 cm   LV e' lateral:   6.42 cm/s LVOT diam:     2.00 cm   LV E/e' lateral: 24.9 LV SV:         84 LV SV Index:   40 LVOT Area:     3.14 cm  RIGHT VENTRICLE RV S prime:     7.18 cm/s TAPSE (M-mode): 1.7 cm LEFT ATRIUM              Index        RIGHT ATRIUM           Index LA diam:        5.00 cm  2.39 cm/m  RA Area:     23.50 cm LA Vol (A2C):   67.2 ml  32.07 ml/m  RA Volume:   74.70 ml  35.65 ml/m LA Vol (A4C):   128.0 ml 61.09 ml/m LA Biplane Vol: 91.5 ml  43.67 ml/m  AORTIC VALVE AV Area (Vmax):    1.65 cm AV Area (Vmean):   1.78 cm AV Area (VTI):     1.82 cm AV Vmax:           186.00 cm/s AV Vmean:          122.000 cm/s AV VTI:            0.463 m AV Peak Grad:      13.8 mmHg AV Mean Grad:      7.0 mmHg LVOT Vmax:         97.50 cm/s LVOT Vmean:        69.000 cm/s LVOT VTI:          0.268 m LVOT/AV VTI ratio: 0.58  AORTA Ao Root diam: 3.40 cm MITRAL VALVE                TRICUSPID VALVE MV Area (PHT): 4.06 cm     TR Peak grad:   52.1 mmHg MV Mean grad:  2.0 mmHg     TR Vmax:        361.00 cm/s MV Decel Time: 187 msec MR Peak grad: 107.7 mmHg    SHUNTS MR Mean grad: 71.0 mmHg     Systemic VTI:  0.27 m MR Vmax:      519.00 cm/s   Systemic Diam: 2.00 cm MR Vmean:     400.0 cm/s MV E velocity: 160.00 cm/s MV A velocity: 100.00 cm/s MV E/A ratio:  1.60 Armida Lander MD Electronically signed by Armida Lander MD Signature Date/Time: 10/06/2023/4:48:25 PM    Final    US  RENAL Result Date: 10/06/2023 CLINICAL DATA:  6300 Hydronephrosis 6300 EXAM: RENAL / URINARY TRACT ULTRASOUND COMPLETE COMPARISON:  July 20, 2023 FINDINGS: Right Kidney: Renal measurements: 10.9 x 5.8 x 6.3 cm = volume: 280 mL. Normal echogenicity. No mass. No hydronephrosis or nephrolithiasis. Left Kidney: Renal measurements: 11.6 x 6.6 x 5.5 cm = volume: 221 mL. Normal echogenicity. No mass. No hydronephrosis or nephrolithiasis. Bladder:  Partially distended with otherwise expected wall thickening for level of distention. The previously noted lobular mass on the prior PET-CT was not well visualized on today's ultrasound. Other: Small hilar splenule present. IMPRESSION: No hydronephrosis or nephrolithiasis. Electronically Signed   By: Rance Burrows M.D.   On: 10/06/2023 14:54   DG Chest 2 View Result Date: 10/05/2023 CLINICAL DATA:  Chest pain EXAM: CHEST - 2 VIEW COMPARISON:  02/24/2023 FINDINGS: Left AICD remains in place, unchanged. Heart and mediastinal contours are within normal limits. Lungs are clear. No effusions. Extensive sclerotic lesions throughout the visualized skeleton compatible with sclerotic metastatic disease. IMPRESSION: No acute cardiopulmonary disease. Extensive sclerotic osseous metastases. Electronically Signed   By: Janeece Mechanic M.D.   On: 10/05/2023 21:29    Assessment       PLAN   *** EGD/TCS Eliquis  ***   Trudie Fuse. Harles Lied, MHS, PA-C [facility]

## 2023-10-12 NOTE — Telephone Encounter (Signed)
  Request for patient to stop medication prior to procedure or is needing cleareance  10/12/23  Luis Donovan Sep 11, 1945  What type of surgery is being performed? Colonoscopy/EGD/ED  When is surgery scheduled? TBD  What type of clearance is required (medical or pharmacy to hold medication or both? medication  Are there any medications that need to be held prior to surgery and how long? Eliquis  x 2 days  Name of physician performing surgery?  Dr. Chryl Crank [facility] at [facility] Phone: [phone] Fax: [phone]  Anethesia type (none, local, MAC, general)? MAC

## 2023-10-12 NOTE — Patient Instructions (Signed)
 We will work towards scheduling an upper endoscopy and colonoscopy in the near future.   We will reach out to Dr. Avis Boehringer to get approval to hold Eliquis  48 hours before your procedures.

## 2023-10-19 NOTE — Telephone Encounter (Signed)
 CLEARANCE RECEIVED AND BEING SCANNED INTO MEDIA

## 2023-10-26 NOTE — Telephone Encounter (Signed)
 LMOVM to return call.

## 2023-10-26 NOTE — Telephone Encounter (Signed)
 Ok to schedule tcs/egd/ed as per previously orders provided.  Hold eliquis  48 hours before.

## 2023-11-02 ENCOUNTER — Encounter: Payer: Self-pay | Admitting: *Deleted

## 2023-11-02 ENCOUNTER — Other Ambulatory Visit: Payer: Self-pay | Admitting: *Deleted

## 2023-11-02 MED ORDER — PEG 3350-KCL-NA BICARB-NACL 420 G PO SOLR: 4000.0000 mL | Freq: Once | ORAL | 0 refills | Status: AC

## 2023-11-02 NOTE — Telephone Encounter (Signed)
 Pt has been scheduled 12/10/23. Instructions mailed and prep sent to pharmacy

## 2023-11-03 ENCOUNTER — Encounter: Payer: Self-pay | Admitting: Urology

## 2023-11-03 ENCOUNTER — Ambulatory Visit (INDEPENDENT_AMBULATORY_CARE_PROVIDER_SITE_OTHER): Admitting: Urology

## 2023-11-03 VITALS — BP 171/78 | HR 65

## 2023-11-03 DIAGNOSIS — N3289 Other specified disorders of bladder: Secondary | ICD-10-CM

## 2023-11-03 DIAGNOSIS — C61 Malignant neoplasm of prostate: Secondary | ICD-10-CM

## 2023-11-03 DIAGNOSIS — N1330 Unspecified hydronephrosis: Secondary | ICD-10-CM

## 2023-11-03 LAB — URINALYSIS, ROUTINE W REFLEX MICROSCOPIC
Bilirubin, UA: NEGATIVE
Glucose, UA: NEGATIVE
Ketones, UA: NEGATIVE
Leukocytes,UA: NEGATIVE
Nitrite, UA: NEGATIVE
Protein,UA: NEGATIVE
Specific Gravity, UA: <1.005 — ABNORMAL LOW (ref 1.005–1.030)
Urobilinogen, Ur: 0.2 mg/dL (ref 0.2–1.0)
pH, UA: 6 (ref 5.0–7.5)

## 2023-11-03 LAB — MICROSCOPIC EXAMINATION
Bacteria, UA: NONE SEEN
WBC, UA: NONE SEEN /HPF (ref 0–5)

## 2023-11-03 NOTE — Progress Notes (Signed)
 11/03/2023 2:04 PM   Luis Donovan 08/25/1945 [id number]  Referring provider: [facility], [facility] [address] [place],  [place] [address]  Followup hydronephrosis   HPI: Luis Donovan is a 77yo here for followup for hydronephrosis and prostate cancer. He is on ADT and nubeqa. PSa 0.1 per patient. He gets mild to moderate hot flashes. He denies any flank pain. Renal US  10/06/2023 showed no hydronephrosis   PMH: Past Medical History:  Diagnosis Date   Anemia    CVA (cerebral vascular accident) (HCC) 07/17/2013   Empyema (HCC)    Erectile dysfunction due to arterial insufficiency    Essential (primary) hypertension    Gastro-esophageal reflux disease with esophagitis    GERD (gastroesophageal reflux disease)    Head injury    Heart disease    Hyperlipidemia, unspecified    Hypertension    Obesity, unspecified    Pneumonia    Post-traumatic stress disorder, unspecified    Prostate cancer (HCC)    Sleep apnea, unspecified     Surgical History: Past Surgical History:  Procedure Laterality Date   CARDIAC DEFIBRILLATOR PLACEMENT     CYSTOSCOPY WITH BIOPSY N/A 03/11/2023   Procedure: CYSTOSCOPY WITH BIOPSY;  Surgeon: Marco Severs, MD;  Location: [facility];  Service: Urology;  Laterality: N/A;   CYSTOSCOPY WITH STENT PLACEMENT Right 03/11/2023   Procedure: CYSTOSCOPY WITH STENT PLACEMENT;  Surgeon: Marco Severs, MD;  Location: [facility];  Service: Urology;  Laterality: Right;   LUNG SURGERY     VATS   operation on skin of neck     cyst removal   ROTATOR CUFF REPAIR Left    TENDON REPAIR     TRANSURETHRAL RESECTION OF BLADDER TUMOR N/A 03/11/2023   Procedure: TRANSURETHRAL RESECTION OF BLADDER TUMOR (TURBT);  Surgeon: Marco Severs, MD;  Location: [facility];  Service: Urology;  Laterality: N/A;   VIDEO ASSISTED THORACOSCOPY      Home Medications:  Allergies as of 11/03/2023       Reactions   Docetaxel Shortness Of Breath         Medication List        Accurate as of November 03, 2023  2:04 PM. If you have any questions, ask your nurse or doctor.          bisacodyl  5 MG EC tablet Commonly known as: DULCOLAX Take 10 mg by mouth daily as needed for moderate constipation.   Carboxymethylcellulose Sod PF 0.5 % Soln Place 1 drop into both eyes 2 (two) times daily. For dry eyes   clotrimazole 1 % cream Commonly known as: LOTRIMIN Apply 1 Application topically 2 (two) times daily. Apply a small amount to affected area twice daily for feet.   cyanocobalamin  500 MCG tablet Commonly known as: VITAMIN B12 Take 1 tablet by mouth daily.   darolutamide 300 MG tablet Commonly known as: NUBEQA Take 600 mg by mouth 2 (two) times daily with a meal.   diclofenac Sodium 1 % Gel Commonly known as: VOLTAREN Apply 2 g topically 4 (four) times daily as needed (pain).   Eliquis  5 MG Tabs tablet Generic drug: apixaban  Take 5 mg by mouth 2 (two) times daily.   feeding supplement Liqd Take 237 mLs by mouth 2 (two) times daily between meals.   FLUoxetine  40 MG capsule Commonly known as: PROZAC  Take 40 mg by mouth daily.   fluticasone  50 MCG/ACT nasal spray Commonly known as: FLONASE  Place 2 sprays into both nostrils daily.  gabapentin 100 MG capsule Commonly known as: NEURONTIN Take 1 capsule by mouth at bedtime.   KETOTIFEN  FUMARATE OP Apply 1 drop to eye 2 (two) times daily as needed (allergies). 0.025%   latanoprost  0.005 % ophthalmic solution Commonly known as: XALATAN  Place 1 drop into both eyes at bedtime.   metoprolol  succinate 50 MG 24 hr tablet Commonly known as: TOPROL -XL Take 0.5 tablets by mouth daily.   mirtazapine  15 MG tablet Commonly known as: REMERON  Take 0.5 tablets (7.5 mg total) by mouth at bedtime. For loss of appetite   ondansetron  8 MG tablet Commonly known as: ZOFRAN  Take 1 tablet (8 mg total) by mouth every 8 (eight) hours as needed for nausea or vomiting.   Oxycodone  HCl 10  MG Tabs Take 10 mg by mouth every 4 (four) hours as needed (severe pain).   Pacerone  200 MG tablet Generic drug: amiodarone  Take 200 mg by mouth daily.   amiodarone  200 MG tablet Commonly known as: Pacerone  Take 1 tablet (200 mg total) by mouth 2 (two) times daily for 21 days.   pantoprazole  40 MG tablet Commonly known as: PROTONIX  Take 1 tablet (40 mg total) by mouth 2 (two) times daily.   polyethylene glycol 17 g packet Commonly known as: MIRALAX  / GLYCOLAX  Take 17 g by mouth 2 (two) times daily.   potassium chloride  10 MEQ tablet Commonly known as: KLOR-CON  M Take 1 tablet (10 mEq total) by mouth every other day. What changed: when to take this   rosuvastatin  10 MG tablet Commonly known as: CRESTOR  Take 0.5 tablets by mouth daily.   torsemide  20 MG tablet Commonly known as: DEMADEX  Take by mouth. Takes 20 mg in morning and 40 mg in evenings.   traZODone  50 MG tablet Commonly known as: DESYREL  Take 50 mg by mouth at bedtime.   vitamin D3 50 MCG (2000 UT) Caps Take 2,000 Units by mouth daily.   Xtampza  ER 9 MG C12a Generic drug: oxyCODONE  ER Take 9 mg by mouth every 12 (twelve) hours.        Allergies:  Allergies  Allergen Reactions   Docetaxel Shortness Of Breath    Family History: No family history on file.  Social History:  reports that he has quit smoking. He has never used smokeless tobacco. He reports that he does not drink alcohol  and does not use drugs.  ROS: All other review of systems were reviewed and are negative except what is noted above in HPI  Physical Exam: BP (!) 171/78   Pulse 65   Constitutional:  Alert and oriented, No acute distress. HEENT: [place] AT, moist mucus membranes.  Trachea midline, no masses. Cardiovascular: No clubbing, cyanosis, or edema. Respiratory: Normal respiratory effort, no increased work of breathing. GI: Abdomen is soft, nontender, nondistended, no abdominal masses GU: No CVA tenderness.  Lymph: No cervical  or inguinal lymphadenopathy. Skin: No rashes, bruises or suspicious lesions. Neurologic: Grossly intact, no focal deficits, moving all 4 extremities. Psychiatric: Normal mood and affect.  Laboratory Data: Lab Results  Component Value Date   WBC 9.0 10/05/2023   HGB 8.4 (L) 10/05/2023   HCT 25.7 (L) 10/05/2023   MCV 97.3 10/05/2023   PLT 220 10/05/2023    Lab Results  Component Value Date   CREATININE 2.46 (H) 10/05/2023    No results found for: PSA  No results found for: TESTOSTERONE  Lab Results  Component Value Date   HGBA1C (H) 09/18/2010    6.2 (NOTE)  According to the ADA Clinical Practice Recommendations for 2011, when HbA1c is used as a screening test:   >=6.5%   Diagnostic of Diabetes Mellitus           (if abnormal result  is confirmed)  5.7-6.4%   Increased risk of developing Diabetes Mellitus  References:Diagnosis and Classification of Diabetes Mellitus,Diabetes Care,2011,34(Suppl 1):S62-S69 and Standards of Medical Care in         Diabetes - 2011,Diabetes Care,2011,34  (Suppl 1):S11-S61.    Urinalysis    Component Value Date/Time   COLORURINE AMBER (A) 01/18/2023 0211   APPEARANCEUR Clear 08/11/2023 1047   LABSPEC 1.018 01/18/2023 0211   PHURINE 5.0 01/18/2023 0211   GLUCOSEU Negative 08/11/2023 1047   HGBUR NEGATIVE 01/18/2023 0211   BILIRUBINUR Negative 08/11/2023 1047   KETONESUR NEGATIVE 01/18/2023 0211   PROTEINUR Negative 08/11/2023 1047   PROTEINUR NEGATIVE 01/18/2023 0211   UROBILINOGEN 1.0 09/18/2010 1918   NITRITE Negative 08/11/2023 1047   NITRITE NEGATIVE 01/18/2023 0211   LEUKOCYTESUR 1+ (A) 08/11/2023 1047   LEUKOCYTESUR NEGATIVE 01/18/2023 0211    Lab Results  Component Value Date   LABMICR See below: 08/11/2023   WBCUA 6-10 (A) 08/11/2023   LABEPIT 0-10 08/11/2023   BACTERIA None seen 08/11/2023    Pertinent Imaging: Renal US  10/06/2023: Images reviewed and  discussed with the patient  No results found for this or any previous visit.  Results for orders placed during the hospital encounter of 01/17/23  US  Venous Img Lower Bilateral (DVT)  Narrative CLINICAL DATA:  Dyspnea, short of breath, lower extremity edema  EXAM: BILATERAL LOWER EXTREMITY VENOUS DOPPLER ULTRASOUND  TECHNIQUE: Gray-scale sonography with graded compression, as well as color Doppler and duplex ultrasound were performed to evaluate the lower extremity deep venous systems from the level of the common femoral vein and including the common femoral, femoral, profunda femoral, popliteal and calf veins including the posterior tibial, peroneal and gastrocnemius veins when visible. The superficial great saphenous vein was also interrogated. Spectral Doppler was utilized to evaluate flow at rest and with distal augmentation maneuvers in the common femoral, femoral and popliteal veins.  COMPARISON:  None Available.  FINDINGS: RIGHT LOWER EXTREMITY  Common Femoral Vein: No evidence of thrombus. Normal compressibility, respiratory phasicity and response to augmentation.  Saphenofemoral Junction: No evidence of thrombus. Normal compressibility and flow on color Doppler imaging.  Profunda Femoral Vein: No evidence of thrombus. Normal compressibility and flow on color Doppler imaging.  Femoral Vein: No evidence of thrombus. Normal compressibility, respiratory phasicity and response to augmentation.  Popliteal Vein: No evidence of thrombus. Normal compressibility, respiratory phasicity and response to augmentation.  Calf Veins: No evidence of thrombus. Normal compressibility and flow on color Doppler imaging.  Superficial Great Saphenous Vein: No evidence of thrombus. Normal compressibility.  Venous Reflux:  None.  Other Findings:  None.  LEFT LOWER EXTREMITY  Common Femoral Vein: No evidence of thrombus. Normal compressibility, respiratory phasicity and  response to augmentation.  Saphenofemoral Junction: No evidence of thrombus. Normal compressibility and flow on color Doppler imaging.  Profunda Femoral Vein: No evidence of thrombus. Normal compressibility and flow on color Doppler imaging.  Femoral Vein: No evidence of thrombus. Normal compressibility, respiratory phasicity and response to augmentation.  Popliteal Vein: No evidence of thrombus. Normal compressibility, respiratory phasicity and response to augmentation.  Calf Veins: No evidence of thrombus. Normal compressibility and flow on color Doppler imaging.  Superficial Great Saphenous Vein: No evidence of thrombus. Normal compressibility.  Venous Reflux:  None.  Other Findings:  None.  IMPRESSION: No evidence of deep venous thrombosis in either lower extremity.   Electronically Signed By: Fernando Hoyer M.D. On: 01/20/2023 13:29  No results found for this or any previous visit.  No results found for this or any previous visit.  Results for orders placed during the hospital encounter of 10/05/23  US  RENAL  Narrative CLINICAL DATA:  6300 Hydronephrosis 6300  EXAM: RENAL / URINARY TRACT ULTRASOUND COMPLETE  COMPARISON:  July 20, 2023  FINDINGS: Right Kidney:  Renal measurements: 10.9 x 5.8 x 6.3 cm = volume: 280 mL. Normal echogenicity. No mass. No hydronephrosis or nephrolithiasis.  Left Kidney:  Renal measurements: 11.6 x 6.6 x 5.5 cm = volume: 221 mL. Normal echogenicity. No mass. No hydronephrosis or nephrolithiasis.  Bladder:  Partially distended with otherwise expected wall thickening for level of distention. The previously noted lobular mass on the prior PET-CT was not well visualized on today's ultrasound.  Other:  Small hilar splenule present.  IMPRESSION: No hydronephrosis or nephrolithiasis.   Electronically Signed By: Rance Burrows M.D. On: 10/06/2023 14:54  No results found for this or any previous visit.  No  results found for this or any previous visit.  No results found for this or any previous visit.   Assessment & Plan:    1. Prostate cancer [facility]) [facility] [place]  2. Hydronephrosis, unspecified hydronephrosis type -followup 6 months with renal US    No follow-ups on file.  Johnie Nailer, MD  [facility] [place]

## 2023-11-03 NOTE — Patient Instructions (Signed)
 Hydronephrosis  Hydronephrosis is the swelling of one or both kidneys due to a blockage that stops urine from flowing out of the body. Kidneys filter waste from the blood and produce urine. This condition can lead to kidney failure and may become life-threatening if not treated promptly. What are the causes? In infants and children, common causes include problems that occur when a baby is developing in the womb. These can include problems in the kidneys or in the tubes that drain urine into the bladder (ureters). In adults, common causes include: Kidney stones. Pregnancy. A tumor or cyst in the abdomen or pelvis. An enlarged prostate gland. Other causes include: Bladder infection. Scar tissue from a previous surgery or injury. A blood clot. Cancer of the prostate, bladder, uterus, ovary, or colon. What are the signs or symptoms? Symptoms of this condition include: Pain or discomfort in your side (flank) or abdomen. Swelling in your abdomen. Nausea and vomiting. Fever. Pain when passing urine. Feelings of urgency when you need to urinate. Urinating more often than normal. In some cases, you may not have any symptoms. How is this diagnosed? This condition may be diagnosed based on: Your symptoms and medical history. A physical exam. Blood and urine tests. Imaging tests, such as an ultrasound, CT scan, or MRI. A procedure to look at your urinary tract and bladder by inserting a scope into the urethra (cystoscopy). How is this treated? Treatment for this condition depends on where the blockage is, how long it has been there, and what caused it. The goal of treatment is to remove the blockage. Treatment may include: Antibiotic medicines to treat or prevent infection. A procedure to place a small, thin tube (stent) into a blocked ureter. The stent will keep the ureter open so that urine can drain through it. A nonsurgical procedure that crushes kidney stones with shock waves  (extracorporeal shock wave lithotripsy). If kidney failure occurs, treatment may include dialysis or a kidney transplant. Follow these instructions at home:  Take over-the-counter and prescription medicines only as told by your health care provider. If you were prescribed an antibiotic medicine, take it exactly as told by your health care provider. Do not stop taking the antibiotic even if you start to feel better. Rest and return to your normal activities as told by your health care provider. Ask your health care provider what activities are safe for you. Drink enough fluid to keep your urine pale yellow. Keep all follow-up visits. This is important. Contact a health care provider if: You continue to have symptoms after treatment. You develop new symptoms. Your urine becomes cloudy or bloody. You have a fever. Get help right away if: You have severe flank or abdominal pain. You cannot drink fluids without vomiting. Summary Hydronephrosis is the swelling of one or both kidneys due to a blockage that stops urine from flowing out of the body. Hydronephrosis can lead to kidney failure and may become life-threatening if not treated promptly. The goal of treatment is to remove the blockage. It may include a procedure to insert a stent into a blocked ureter, a procedure to break up kidney stones, or taking antibiotic medicines. Follow your health care provider's instructions for taking care of yourself at home, including instructions about drinking fluids, taking medicines, and limiting activities. This information is not intended to replace advice given to you by your health care provider. Make sure you discuss any questions you have with your health care provider. Document Revised: 02/02/2023 Document Reviewed: 02/02/2023 Elsevier  Patient Education  2024 [employer].

## 2023-12-07 NOTE — Patient Instructions (Addendum)
 Your procedure is scheduled on: 12/10/2023  Report to [facility] Main Entrance at  7:45   AM.  Call this number if you have problems the morning of surgery: [phone]   Remember:  Hold Eliquis  for 2 days prior;  last dose 12/07/2023              Follow Directions on the letter you received from Your Physician's office regarding the Bowel Prep              No Smoking the day of Procedure :   Take these medicines the morning of surgery with A SIP OF WATER : Prozac , Metoprolol , Pantoprazole , zofran  and/or oxycodone  if needed   Do not wear jewelry, make-up or nail polish.    Do not bring valuables to the hospital.  Contacts, dentures or bridgework may not be worn into surgery.  .   Patients discharged the day of surgery will not be allowed to drive home.     Colonoscopy, Adult, Care After This sheet gives you information about how to care for yourself after your procedure. Your health care provider may also give you more specific instructions. If you have problems or questions, contact your health care provider. What can I expect after the procedure? After the procedure, it is common to have: A small amount of blood in your stool for 24 hours after the procedure. Some gas. Mild abdominal cramping or bloating.  Follow these instructions at home: General instructions  For the first 24 hours after the procedure: Do not drive or use machinery. Do not sign important documents. Do not drink alcohol . Do your regular daily activities at a slower pace than normal. Eat soft, easy-to-digest foods. Rest often. Take over-the-counter or prescription medicines only as told by your health care provider. It is up to you to get the results of your procedure. Ask your health care provider, or the department performing the procedure, when your results will be ready. Relieving cramping and bloating Try walking around when you have cramps or feel bloated. Apply heat to your abdomen as told by  your health care provider. Use a heat source that your health care provider recommends, such as a moist heat pack or a heating pad. Place a towel between your skin and the heat source. Leave the heat on for 20-30 minutes. Remove the heat if your skin turns bright red. This is especially important if you are unable to feel pain, heat, or cold. You may have a greater risk of getting burned. Eating and drinking Drink enough fluid to keep your urine clear or pale yellow. Resume your normal diet as instructed by your health care provider. Avoid heavy or fried foods that are hard to digest. Avoid drinking alcohol  for as long as instructed by your health care provider. Contact a health care provider if: You have blood in your stool 2-3 days after the procedure. Get help right away if: You have more than a small spotting of blood in your stool. You pass large blood clots in your stool. Your abdomen is swollen. You have nausea or vomiting. You have a fever. You have increasing abdominal pain that is not relieved with medicine. This information is not intended to replace advice given to you by your health care provider. Make sure you discuss any questions you have with your health care provider. Document Released: 12/17/2003 Document Revised: 01/27/2016 Document Reviewed: 07/16/2015 Elsevier Interactive Patient Education  2018 Elsevier Inc[employer] Upper Endoscopy, Adult, Care After After the procedure,  it is common to have a sore throat. It is also common to have: Mild stomach pain or discomfort. Bloating. Nausea. Follow these instructions at home: The instructions below may help you care for yourself at home. Your health care provider may give you more instructions. If you have questions, ask your health care provider. If you were given a sedative during the procedure, it can affect you for several hours. Do not drive or operate machinery until your health care provider says that it is safe. If you will  be going home right after the procedure, plan to have a responsible adult: Take you home from the hospital or clinic. You will not be allowed to drive. Care for you for the time you are told. Follow instructions from your health care provider about what you may eat and drink. Return to your normal activities as told by your health care provider. Ask your health care provider what activities are safe for you. Take over-the-counter and prescription medicines only as told by your health care provider. Contact a health care provider if you: Have a sore throat that lasts longer than one day. Have trouble swallowing. Have a fever. Get help right away if you: Vomit blood or your vomit looks like coffee grounds. Have bloody, black, or tarry stools. Have a very bad sore throat or you cannot swallow. Have difficulty breathing or very bad pain in your chest or abdomen. These symptoms may be an emergency. Get help right away. Call 911. Do not wait to see if the symptoms will go away. Do not drive yourself to the hospital. Summary After the procedure, it is common to have a sore throat, mild stomach discomfort, bloating, and nausea. If you were given a sedative during the procedure, it can affect you for several hours. Do not drive until your health care provider says that it is safe. Follow instructions from your health care provider about what you may eat and drink. Return to your normal activities as told by your health care provider. This information is not intended to replace advice given to you by your health care provider. Make sure you discuss any questions you have with your health care provider. Document Revised: 08/13/2021 Document Reviewed: 08/13/2021 Elsevier Patient Education  2024 [employer].

## 2023-12-08 ENCOUNTER — Encounter (HOSPITAL_COMMUNITY)
Admission: RE | Admit: 2023-12-08 | Discharge: 2023-12-08 | Disposition: A | Discharge status: Home / Self Care | Source: Ambulatory Visit | Attending: Internal Medicine | Admitting: Internal Medicine

## 2023-12-08 VITALS — Ht 67.0 in | Wt 271.8 lb

## 2023-12-08 DIAGNOSIS — D649 Anemia, unspecified: Secondary | ICD-10-CM

## 2023-12-08 DIAGNOSIS — N1831 Chronic kidney disease, stage 3a: Secondary | ICD-10-CM

## 2023-12-10 ENCOUNTER — Ambulatory Visit (HOSPITAL_COMMUNITY): Admitting: Anesthesiology

## 2023-12-10 ENCOUNTER — Encounter (HOSPITAL_COMMUNITY)
Admission: RE | Disposition: A | Discharge status: Home / Self Care | Payer: Self-pay | Source: Home / Self Care | Attending: Internal Medicine

## 2023-12-10 ENCOUNTER — Other Ambulatory Visit: Payer: Self-pay

## 2023-12-10 ENCOUNTER — Encounter (HOSPITAL_COMMUNITY): Payer: Self-pay | Admitting: Internal Medicine

## 2023-12-10 ENCOUNTER — Ambulatory Visit (HOSPITAL_COMMUNITY)
Admission: RE | Admit: 2023-12-10 | Discharge: 2023-12-10 | Disposition: A | Discharge status: Home / Self Care | Attending: Internal Medicine | Admitting: Internal Medicine

## 2023-12-10 DIAGNOSIS — I2510 Atherosclerotic heart disease of native coronary artery without angina pectoris: Secondary | ICD-10-CM

## 2023-12-10 DIAGNOSIS — D509 Iron deficiency anemia, unspecified: Secondary | ICD-10-CM | POA: Diagnosis present

## 2023-12-10 DIAGNOSIS — F419 Anxiety disorder, unspecified: Secondary | ICD-10-CM | POA: Diagnosis not present

## 2023-12-10 DIAGNOSIS — K5730 Diverticulosis of large intestine without perforation or abscess without bleeding: Secondary | ICD-10-CM | POA: Diagnosis not present

## 2023-12-10 DIAGNOSIS — K2960 Other gastritis without bleeding: Secondary | ICD-10-CM | POA: Insufficient documentation

## 2023-12-10 DIAGNOSIS — K219 Gastro-esophageal reflux disease without esophagitis: Secondary | ICD-10-CM

## 2023-12-10 DIAGNOSIS — I110 Hypertensive heart disease with heart failure: Secondary | ICD-10-CM | POA: Insufficient documentation

## 2023-12-10 DIAGNOSIS — R1310 Dysphagia, unspecified: Secondary | ICD-10-CM

## 2023-12-10 DIAGNOSIS — G4730 Sleep apnea, unspecified: Secondary | ICD-10-CM | POA: Diagnosis not present

## 2023-12-10 DIAGNOSIS — E66813 Obesity, class 3: Secondary | ICD-10-CM | POA: Diagnosis not present

## 2023-12-10 DIAGNOSIS — K2970 Gastritis, unspecified, without bleeding: Secondary | ICD-10-CM

## 2023-12-10 DIAGNOSIS — Z6841 Body Mass Index (BMI) 40.0 and over, adult: Secondary | ICD-10-CM | POA: Insufficient documentation

## 2023-12-10 DIAGNOSIS — I509 Heart failure, unspecified: Secondary | ICD-10-CM | POA: Diagnosis not present

## 2023-12-10 DIAGNOSIS — Z139 Encounter for screening, unspecified: Secondary | ICD-10-CM | POA: Diagnosis not present

## 2023-12-10 DIAGNOSIS — Z8673 Personal history of transient ischemic attack (TIA), and cerebral infarction without residual deficits: Secondary | ICD-10-CM | POA: Diagnosis not present

## 2023-12-10 DIAGNOSIS — Z87891 Personal history of nicotine dependence: Secondary | ICD-10-CM | POA: Insufficient documentation

## 2023-12-10 DIAGNOSIS — K2289 Other specified disease of esophagus: Secondary | ICD-10-CM | POA: Diagnosis not present

## 2023-12-10 DIAGNOSIS — D123 Benign neoplasm of transverse colon: Secondary | ICD-10-CM | POA: Diagnosis not present

## 2023-12-10 HISTORY — PX: ESOPHAGOGASTRODUODENOSCOPY: SHX5428

## 2023-12-10 HISTORY — PX: COLONOSCOPY: SHX5424

## 2023-12-10 SURGERY — COLONOSCOPY
Anesthesia: General

## 2023-12-10 MED ORDER — LACTATED RINGERS IV SOLN: INTRAVENOUS | Status: DC

## 2023-12-10 MED ORDER — LIDOCAINE 2% (20 MG/ML) 5 ML SYRINGE
INTRAMUSCULAR | Status: DC | PRN
  Administered 2023-12-10: 60 mg via INTRAVENOUS

## 2023-12-10 MED ORDER — PROPOFOL 500 MG/50ML IV EMUL
INTRAVENOUS | Status: DC | PRN
  Administered 2023-12-10: 80 mg via INTRAVENOUS
  Administered 2023-12-10: 150 ug/kg/min via INTRAVENOUS
  Administered 2023-12-10: 20 mg via INTRAVENOUS

## 2023-12-10 NOTE — Op Note (Signed)
 [facility] Patient Name: Luis Donovan Procedure Date: 12/10/2023 10:06 AM MRN: [id number] Date of Birth: [birth year] Attending MD: Carlin POUR. Cindie HAS, [id number] CSN: [id number] Age: 78 Admit Type: Outpatient Procedure:                Colonoscopy Indications:              Iron deficiency anemia Providers:                Carlin POUR. Cindie, DO, Devere Lodge, Kristine L.                            Shirlean Balm, Technician Referring MD:              Medicines:                See the Anesthesia note for documentation of the                            administered medications Complications:            No immediate complications. Estimated Blood Loss:     Estimated blood loss was minimal. Procedure:                Pre-Anesthesia Assessment:                           - The anesthesia plan was to use monitored                            anesthesia care (MAC).                           After obtaining informed consent, the colonoscope                            was passed under direct vision. Throughout the                            procedure, the patient's blood pressure, pulse, and                            oxygen saturations were monitored continuously. The                            [id number]-HQ190L ([id number]) scope was introduced through                            the anus and advanced to the the cecum, identified                            by appendiceal orifice and ileocecal valve. The                            colonoscopy was performed without difficulty. The                            patient tolerated the procedure well. The  quality                            of the bowel preparation was evaluated using the                            BBPS [facility] Bowel Preparation Scale) with scores                            of: Right Colon = 3, Transverse Colon = 3 and Left                            Colon = 3 (entire mucosa seen well with no residual                            staining, small  fragments of stool or opaque                            liquid). The total BBPS score equals 9. Scope In: 10:08:59 AM Scope Out: 10:22:01 AM Scope Withdrawal Time: 0 hours 10 minutes 59 seconds  Total Procedure Duration: 0 hours 13 minutes 2 seconds  Findings:      Many large-mouthed and small-mouthed diverticula were found in the       entire colon.      Two sessile polyps were found in the transverse colon. The polyps were 3       to 4 mm in size. These polyps were removed with a cold snare. Resection       and retrieval were complete.      The exam was otherwise without abnormality. Impression:               - Diverticulosis in the entire examined colon.                           - Two 3 to 4 mm polyps in the transverse colon,                            removed with a cold snare. Resected and retrieved.                           - The examination was otherwise normal. Moderate Sedation:      Per Anesthesia Care Recommendation:           - Patient has a contact number available for                            emergencies. The signs and symptoms of potential                            delayed complications were discussed with the                            patient. Return to normal activities tomorrow.  Written discharge instructions were provided to the                            patient.                           - Resume previous diet.                           - Continue present medications.                           - Await pathology results.                           - No repeat colonoscopy due to age. Procedure Code(s):        --- Professional ---                           [phone], Colonoscopy, flexible; with removal of                            tumor(s), polyp(s), or other lesion(s) by snare                            technique Diagnosis Code(s):        --- Professional ---                           D12.3, Benign neoplasm of transverse colon (hepatic                             flexure or splenic flexure)                           D50.9, Iron deficiency anemia, unspecified                           K57.30, Diverticulosis of large intestine without                            perforation or abscess without bleeding CPT copyright 2022 American Medical Association. All rights reserved. The codes documented in this report are preliminary and upon coder review may  be revised to meet current compliance requirements. Carlin POUR. Cindie, DO Carlin POUR. Cindie, DO 12/10/2023 10:24:38 AM This report has been signed electronically. Number of Addenda: 0

## 2023-12-10 NOTE — Anesthesia Preprocedure Evaluation (Signed)
 Anesthesia Evaluation  Patient identified by MRN, date of birth, ID band Patient awake    Reviewed: Allergy & Precautions, H&P , NPO status , Patient's Chart, lab work & pertinent test results, reviewed documented beta blocker date and time   Airway Mallampati: II  TM Distance: >3 FB Neck ROM: full    Dental no notable dental hx.    Pulmonary shortness of breath, sleep apnea , pneumonia, former smoker   Pulmonary exam normal breath sounds clear to auscultation       Cardiovascular Exercise Tolerance: Good hypertension, + CAD and +CHF   Rhythm:regular Rate:Normal     Neuro/Psych  PSYCHIATRIC DISORDERS Anxiety     CVA    GI/Hepatic Neg liver ROS,GERD  ,,  Endo/Other    Class 3 obesity  Renal/GU Renal disease  negative genitourinary   Musculoskeletal   Abdominal   Peds  Hematology  (+) Blood dyscrasia, anemia   Anesthesia Other Findings   Reproductive/Obstetrics negative OB ROS                              Anesthesia Physical Anesthesia Plan  ASA: 3  Anesthesia Plan: General   Post-op Pain Management:    Induction:   PONV Risk Score and Plan: Propofol  infusion  Airway Management Planned:   Additional Equipment:   Intra-op Plan:   Post-operative Plan:   Informed Consent: I have reviewed the patients History and Physical, chart, labs and discussed the procedure including the risks, benefits and alternatives for the proposed anesthesia with the patient or authorized representative who has indicated his/her understanding and acceptance.     Dental Advisory Given  Plan Discussed with: CRNA  Anesthesia Plan Comments:         Anesthesia Quick Evaluation

## 2023-12-10 NOTE — Discharge Instructions (Signed)
 EGD Discharge instructions Please read the instructions outlined below and refer to this sheet in the next few weeks. These discharge instructions provide you with general information on caring for yourself after you leave the hospital. Your doctor may also give you specific instructions. While your treatment has been planned according to the most current medical practices available, unavoidable complications occasionally occur. If you have any problems or questions after discharge, please call your doctor. ACTIVITY You may resume your regular activity but move at a slower pace for the next 24 hours.  Take frequent rest periods for the next 24 hours.  Walking will help expel (get rid of) the air and reduce the bloated feeling in your abdomen.  No driving for 24 hours (because of the anesthesia (medicine) used during the test).  You may shower.  Do not sign any important legal documents or operate any machinery for 24 hours (because of the anesthesia used during the test).  NUTRITION Drink plenty of fluids.  You may resume your normal diet.  Begin with a light meal and progress to your normal diet.  Avoid alcoholic beverages for 24 hours or as instructed by your caregiver.  MEDICATIONS You may resume your normal medications unless your caregiver tells you otherwise.  WHAT YOU CAN EXPECT TODAY You may experience abdominal discomfort such as a feeling of fullness or "gas" pains.  FOLLOW-UP Your doctor will discuss the results of your test with you.  SEEK IMMEDIATE MEDICAL ATTENTION IF ANY OF THE FOLLOWING OCCUR: Excessive nausea (feeling sick to your stomach) and/or vomiting.  Severe abdominal pain and distention (swelling).  Trouble swallowing.  Temperature over 101 F (37.8 C).  Rectal bleeding or vomiting of blood.     Colonoscopy Discharge Instructions  Read the instructions outlined below and refer to this sheet in the next few weeks. These discharge instructions provide you with  general information on caring for yourself after you leave the hospital. Your doctor may also give you specific instructions. While your treatment has been planned according to the most current medical practices available, unavoidable complications occasionally occur.   ACTIVITY You may resume your regular activity, but move at a slower pace for the next 24 hours.  Take frequent rest periods for the next 24 hours.  Walking will help get rid of the air and reduce the bloated feeling in your belly (abdomen).  No driving for 24 hours (because of the medicine (anesthesia) used during the test).   Do not sign any important legal documents or operate any machinery for 24 hours (because of the anesthesia used during the test).  NUTRITION Drink plenty of fluids.  You may resume your normal diet as instructed by your doctor.  Begin with a light meal and progress to your normal diet. Heavy or fried foods are harder to digest and may make you feel sick to your stomach (nauseated).  Avoid alcoholic beverages for 24 hours or as instructed.  MEDICATIONS You may resume your normal medications unless your doctor tells you otherwise.  WHAT YOU CAN EXPECT TODAY Some feelings of bloating in the abdomen.  Passage of more gas than usual.  Spotting of blood in your stool or on the toilet paper.  IF YOU HAD POLYPS REMOVED DURING THE COLONOSCOPY: No aspirin  products for 7 days or as instructed.  No alcohol  for 7 days or as instructed.  Eat a soft diet for the next 24 hours.  FINDING OUT THE RESULTS OF YOUR TEST Not all test results are  available during your visit. If your test results are not back during the visit, make an appointment with your caregiver to find out the results. Do not assume everything is normal if you have not heard from your caregiver or the medical facility. It is important for you to follow up on all of your test results.  SEEK IMMEDIATE MEDICAL ATTENTION IF: You have more than a spotting of  blood in your stool.  Your belly is swollen (abdominal distention).  You are nauseated or vomiting.  You have a temperature over 101.  You have abdominal pain or discomfort that is severe or gets worse throughout the day.   Your EGD revealed mild amount inflammation in your stomach.  I took biopsies of this to rule out infection with a bacteria called H. pylori.    Your esophagus was wide open, I did not need to dilate this today.  I did take samples of your esophagus however.  We will call with these results as well.  Small bowel is normal.  Your colonoscopy revealed 2 polyp(s) which I removed successfully. Await pathology results, my office will contact you.  Given your age, I do not think you need further colonoscopies for polyp surveillance.  You also have diverticulosis and internal hemorrhoids. I would recommend increasing fiber in your diet or adding OTC Benefiber/Metamucil. Be sure to drink at least 4 to 6 glasses of water  daily. Follow-up with GI at [facility].  Okay to resume your Eliquis  tonight.   I hope you have a great rest of your week!  Luis Donovan. Luis Donovan, D.O. Gastroenterology and Hepatology [facility]

## 2023-12-10 NOTE — H&P (Signed)
 Primary Care Physician:  [facility] Primary Gastroenterologist:  Dr. Cindie  Pre-Procedure History & Physical: HPI:  Luis Donovan is a 78 y.o. male is here  for an EGD with possible dilation due to history of dysphagia, GERD and colonoscopy for iron deficiency anemia.   Past Medical History:  Diagnosis Date   Anemia    CVA (cerebral vascular accident) (HCC) 07/17/2013   Empyema (HCC)    Erectile dysfunction due to arterial insufficiency    Essential (primary) hypertension    Gastro-esophageal reflux disease with esophagitis    GERD (gastroesophageal reflux disease)    Head injury    Heart disease    Hyperlipidemia, unspecified    Hypertension    Obesity, unspecified    Pneumonia    Post-traumatic stress disorder, unspecified    Prostate cancer (HCC)    Sleep apnea, unspecified     Past Surgical History:  Procedure Laterality Date   CARDIAC DEFIBRILLATOR PLACEMENT     CYSTOSCOPY WITH BIOPSY N/A 03/11/2023   Procedure: CYSTOSCOPY WITH BIOPSY;  Surgeon: Sherrilee Belvie CROME, MD;  Location: [facility];  Service: Urology;  Laterality: N/A;   CYSTOSCOPY WITH STENT PLACEMENT Right 03/11/2023   Procedure: CYSTOSCOPY WITH STENT PLACEMENT;  Surgeon: Sherrilee Belvie CROME, MD;  Location: [facility];  Service: Urology;  Laterality: Right;   LUNG SURGERY     VATS   operation on skin of neck     cyst removal   ROTATOR CUFF REPAIR Left    TENDON REPAIR     TRANSURETHRAL RESECTION OF BLADDER TUMOR N/A 03/11/2023   Procedure: TRANSURETHRAL RESECTION OF BLADDER TUMOR (TURBT);  Surgeon: Sherrilee Belvie CROME, MD;  Location: [facility];  Service: Urology;  Laterality: N/A;   VIDEO ASSISTED THORACOSCOPY      Prior to Admission medications   Medication Sig Start Date End Date Taking? Authorizing Provider  bisacodyl  (DULCOLAX) 5 MG EC tablet Take 10 mg by mouth daily as needed for moderate constipation. 02/22/23  Yes [provider]  Cholecalciferol  (VITAMIN D3) 50 MCG (2000 UT) CAPS  Take 2,000 Units by mouth daily.   Yes [provider]  clotrimazole (LOTRIMIN) 1 % cream Apply 1 Application topically 2 (two) times daily. Apply a small amount to affected area twice daily for feet.   Yes [provider]  cyanocobalamin  (VITAMIN B12) 500 MCG tablet Take 1 tablet by mouth daily. 06/17/23  Yes [provider]  darolutamide (NUBEQA) 300 MG tablet Take 600 mg by mouth 2 (two) times daily with a meal. 03/30/23  Yes [provider]  diclofenac Sodium (VOLTAREN) 1 % GEL Apply 2 g topically 4 (four) times daily as needed (pain). 07/16/22  Yes [provider]  FLUoxetine  (PROZAC ) 40 MG capsule Take 40 mg by mouth daily.   Yes [provider]  fluticasone  (FLONASE ) 50 MCG/ACT nasal spray Place 2 sprays into both nostrils daily. 06/29/23  Yes Leath-Warren, Etta PARAS, NP  gabapentin (NEURONTIN) 100 MG capsule Take 1 capsule by mouth at bedtime. 09/08/23  Yes [provider]  KETOTIFEN  FUMARATE OP Apply 1 drop to eye 2 (two) times daily as needed (allergies). 0.025%   Yes [provider]  latanoprost  (XALATAN ) 0.005 % ophthalmic solution Place 1 drop into both eyes at bedtime.   Yes [provider]  metoprolol  succinate (TOPROL -XL) 50 MG 24 hr tablet Take 0.5 tablets by mouth daily. 07/30/23  Yes [provider]  mirtazapine  (REMERON ) 15 MG tablet Take 0.5 tablets (7.5 mg total)  by mouth at bedtime. For loss of appetite 02/26/23  Yes Ricky Fines, MD  oxyCODONE  ER (XTAMPZA  ER) 9 MG C12A Take 9 mg by mouth every 12 (twelve) hours. 01/14/23  Yes Pickenpack-Cousar, Fannie SAILOR, NP  pantoprazole  (PROTONIX ) 40 MG tablet Take 1 tablet (40 mg total) by mouth 2 (two) times daily. 10/06/23 12/10/23 Yes Bryn Bernardino NOVAK, MD  polyethylene glycol (MIRALAX  / GLYCOLAX ) 17 g packet Take 17 g by mouth 2 (two) times daily. 01/13/23  Yes Pickenpack-Cousar, Fannie SAILOR, NP  rosuvastatin  (CRESTOR ) 10 MG tablet Take 0.5 tablets by mouth  daily. 04/01/23  Yes [provider]  torsemide  (DEMADEX ) 20 MG tablet Take by mouth. Takes 20 mg in morning and 40 mg in evenings. 04/13/23  Yes [provider]  traZODone  (DESYREL ) 50 MG tablet Take 50 mg by mouth at bedtime. 02/16/13  Yes [provider]  amiodarone  (PACERONE ) 200 MG tablet Take 200 mg by mouth daily. Patient not taking: Reported on 10/12/2023 03/25/23   [provider]  amiodarone  (PACERONE ) 200 MG tablet Take 1 tablet (200 mg total) by mouth 2 (two) times daily for 21 days. Patient not taking: Reported on 10/12/2023 10/06/23 10/27/23  Bryn Bernardino NOVAK, MD  apixaban  (ELIQUIS ) 5 MG TABS tablet Take 5 mg by mouth 2 (two) times daily.    [provider]  Carboxymethylcellulose Sod PF 0.5 % SOLN Place 1 drop into both eyes 2 (two) times daily. For dry eyes    [provider]  feeding supplement (ENSURE ENLIVE / ENSURE PLUS) LIQD Take 237 mLs by mouth 2 (two) times daily between meals. 02/26/23   Ricky Fines, MD  ondansetron  (ZOFRAN ) 8 MG tablet Take 1 tablet (8 mg total) by mouth every 8 (eight) hours as needed for nausea or vomiting. 01/14/23   Pickenpack-Cousar, Fannie SAILOR, NP  Oxycodone  HCl 10 MG TABS Take 10 mg by mouth every 4 (four) hours as needed (severe pain). 02/16/23   [provider]  potassium chloride  (KLOR-CON  M) 10 MEQ tablet Take 1 tablet (10 mEq total) by mouth every other day. Patient taking differently: Take 10 mEq by mouth daily. 01/28/23   Vicci Afton CROME, MD    Allergies as of 11/02/2023 - Review Complete 10/12/2023  Allergen Reaction Noted   Docetaxel Shortness Of Breath 05/26/2023    History reviewed. No pertinent family history.  Social History   Socioeconomic History   Marital status: Married    Spouse name: Luis Donovan   Number of children: 4   Years of education: Not on file   Highest education level: Not on file  Occupational History   Occupation: retired    Associate Professor: RETIRED  Tobacco  Use   Smoking status: Former   Smokeless tobacco: Never  Advertising account planner   Vaping status: Never Used  Substance and Sexual Activity   Alcohol  use: No   Drug use: No   Sexual activity: Never    Birth control/protection: None  Other Topics Concern   Not on file  Social History Narrative   Not on file   Social Drivers of Health   Financial Resource Strain: Not on file  Food Insecurity: No Food Insecurity (10/06/2023)   Hunger Vital Sign    Worried About Running Out of Food in the Last Year: Never true    Ran Out of Food in the Last Year: Never true  Transportation Needs: No Transportation Needs (10/06/2023)   PRAPARE - Administrator, Civil Service (Medical): No  Lack of Transportation (Non-Medical): No  Physical Activity: Not on file  Stress: Not on file  Social Connections: Not on file  Intimate Partner Violence: Not At Risk (10/06/2023)   Humiliation, Afraid, Rape, and Kick questionnaire    Fear of Current or Ex-Partner: No    Emotionally Abused: No    Physically Abused: No    Sexually Abused: No    Review of Systems: General: Negative for fever, chills, fatigue, weakness. Eyes: Negative for vision changes.  ENT: Negative for hoarseness, difficulty swallowing , nasal congestion. CV: Negative for chest pain, angina, palpitations, dyspnea on exertion, peripheral edema.  Respiratory: Negative for dyspnea at rest, dyspnea on exertion, cough, sputum, wheezing.  GI: See history of present illness. GU:  Negative for dysuria, hematuria, urinary incontinence, urinary frequency, nocturnal urination.  MS: Negative for joint pain, low back pain.  Derm: Negative for rash or itching.  Neuro: Negative for weakness, abnormal sensation, seizure, frequent headaches, memory loss, confusion.  Psych: Negative for anxiety, depression Endo: Negative for unusual weight change.  Heme: Negative for bruising or bleeding. Allergy: Negative for rash or hives.  Physical Exam: Vital signs  in last 24 hours: Temp:  [98.1 F (36.7 C)] 98.1 F (36.7 C) (07/25 0858) Pulse Rate:  [61] 61 (07/25 0858) Resp:  [12] 12 (07/25 0858) BP: (172)/(56) 172/56 (07/25 0858) SpO2:  [100 %] 100 % (07/25 0858) Weight:  [123.3 kg] 123.3 kg (07/25 0858)   General:   Alert,  Well-developed, well-nourished, pleasant and cooperative in NAD Head:  Normocephalic and atraumatic. Eyes:  Sclera clear, no icterus.   Conjunctiva pink. Ears:  Normal auditory acuity. Nose:  No deformity, discharge,  or lesions. Msk:  Symmetrical without gross deformities. Normal posture. Extremities:  Without clubbing or edema. Neurologic:  Alert and  oriented x4;  grossly normal neurologically. Skin:  Intact without significant lesions or rashes. Psych:  Alert and cooperative. Normal mood and affect.   Impression/Plan: Luis Donovan is here for an EGD with possible dilation due to history of dysphagia, GERD and colonoscopy for iron deficiency anemia.   Risks, benefits, limitations, imponderables and alternatives regarding procedure have been reviewed with the patient. Questions have been answered. All parties agreeable.

## 2023-12-10 NOTE — Op Note (Signed)
 [facility] Patient Name: Luis Donovan Procedure Date: 12/10/2023 9:34 AM MRN: [id number] Date of Birth: 10/23/1945 Attending MD: Carlin POUR. Cindie HAS, [id number] CSN: [id number] Age: 78 Admit Type: Outpatient Procedure:                Upper GI endoscopy Indications:              Iron deficiency anemia, Dysphagia Providers:                Carlin POUR. Cindie, DO, Crystal Page, Devere Lodge,                            Kristine L. Shirlean Balm, Technician Referring MD:              Medicines:                See the Anesthesia note for documentation of the                            administered medications Complications:            No immediate complications. Estimated Blood Loss:     Estimated blood loss was minimal. Procedure:                Pre-Anesthesia Assessment:                           - The anesthesia plan was to use monitored                            anesthesia care (MAC).                           After obtaining informed consent, the endoscope was                            passed under direct vision. Throughout the                            procedure, the patient's blood pressure, pulse, and                            oxygen saturations were monitored continuously. The                            [id number]-[id number] ([id number]) scope was introduced through the                            mouth, and advanced to the second part of duodenum.                            The upper GI endoscopy was accomplished without                            difficulty. The patient tolerated the procedure  well. Scope In: 10:00:06 AM Scope Out: 10:05:07 AM Total Procedure Duration: 0 hours 5 minutes 1 second  Findings:      There is no endoscopic evidence of stenosis or stricture in the entire       esophagus.      Biopsies were taken with a cold forceps in the middle third of the       esophagus for histology.      Patchy mild inflammation characterized by erythema was  found in the       entire examined stomach. Biopsies were taken with a cold forceps for       Helicobacter pylori testing.      The duodenal bulb, first portion of the duodenum and second portion of       the duodenum were normal. Impression:               - Gastritis. Biopsied.                           - Normal duodenal bulb, first portion of the                            duodenum and second portion of the duodenum.                           - Biopsies were taken with a cold forceps for                            histology in the middle third of the esophagus. Moderate Sedation:      Per Anesthesia Care Recommendation:           - Patient has a contact number available for                            emergencies. The signs and symptoms of potential                            delayed complications were discussed with the                            patient. Return to normal activities tomorrow.                            Written discharge instructions were provided to the                            patient.                           - Resume previous diet.                           - Continue present medications.                           - Await pathology results.                           -  Return to GI clinic at [facility] as                            previously scheduled. Procedure Code(s):        --- Professional ---                           [phone], Esophagogastroduodenoscopy, flexible,                            transoral; with biopsy, single or multiple Diagnosis Code(s):        --- Professional ---                           K29.70, Gastritis, unspecified, without bleeding                           D50.9, Iron deficiency anemia, unspecified                           R13.10, Dysphagia, unspecified CPT copyright 2022 American Medical Association. All rights reserved. The codes documented in this report are preliminary and upon coder review may  be revised to meet current  compliance requirements. Carlin POUR. Cindie, DO Carlin POUR. Cindie, DO 12/10/2023 10:07:58 AM This report has been signed electronically. Number of Addenda: 0

## 2023-12-10 NOTE — Transfer of Care (Signed)
 Immediate Anesthesia Transfer of Care Note  Patient: Luis Donovan  Procedure(s) Performed: COLONOSCOPY EGD (ESOPHAGOGASTRODUODENOSCOPY)  Patient Location: Short Stay  Anesthesia Type:General  Level of Consciousness: drowsy  Airway & Oxygen Therapy: Patient Spontanous Breathing  Post-op Assessment: Report given to RN and Post -op Vital signs reviewed and stable  Post vital signs: Reviewed and stable  Last Vitals:  Vitals Value Taken Time  BP 125/37 12/10/23 10:24  Temp 36.9 C 12/10/23 10:24  Pulse 47 12/10/23 10:24  Resp 17 12/10/23 10:24  SpO2 96%     Last Pain:  Vitals:   12/10/23 1024  TempSrc: Axillary  PainSc: 0-No pain      Patients Stated Pain Goal: 6 (12/10/23 0858)  Complications: No notable events documented.

## 2023-12-11 NOTE — Anesthesia Postprocedure Evaluation (Signed)
 Anesthesia Post Note  Patient: Luis Donovan  Procedure(s) Performed: COLONOSCOPY EGD (ESOPHAGOGASTRODUODENOSCOPY)  Patient location during evaluation: Phase II Anesthesia Type: General Level of consciousness: awake Pain management: pain level controlled Vital Signs Assessment: post-procedure vital signs reviewed and stable Respiratory status: spontaneous breathing and respiratory function stable Cardiovascular status: blood pressure returned to baseline and stable Postop Assessment: no headache and no apparent nausea or vomiting Anesthetic complications: no Comments: Late entry   No notable events documented.   Last Vitals:  Vitals:   12/10/23 0858 12/10/23 1024  BP: (!) 172/56 (!) 125/37  Pulse: 61 (!) 47  Resp: 12 17  Temp: 36.7 C 36.9 C  SpO2: 100%     Last Pain:  Vitals:   12/10/23 1024  TempSrc: Axillary  PainSc: 0-No pain                 Yvonna JINNY Bosworth

## 2023-12-13 ENCOUNTER — Encounter (HOSPITAL_COMMUNITY): Payer: Self-pay | Admitting: Internal Medicine

## 2023-12-14 LAB — SURGICAL PATHOLOGY

## 2023-12-16 ENCOUNTER — Ambulatory Visit: Payer: Self-pay | Admitting: Internal Medicine

## 2024-04-21 ENCOUNTER — Ambulatory Visit (HOSPITAL_COMMUNITY): Admission: RE | Admit: 2024-04-21 | Discharge: 2024-04-21 | Attending: Urology

## 2024-04-21 DIAGNOSIS — N1330 Unspecified hydronephrosis: Secondary | ICD-10-CM | POA: Diagnosis not present

## 2024-05-05 ENCOUNTER — Ambulatory Visit: Admitting: Urology

## 2024-05-05 VITALS — BP 135/64 | HR 58

## 2024-05-05 DIAGNOSIS — N1330 Unspecified hydronephrosis: Secondary | ICD-10-CM | POA: Diagnosis not present

## 2024-05-05 DIAGNOSIS — C61 Malignant neoplasm of prostate: Secondary | ICD-10-CM | POA: Diagnosis not present

## 2024-05-05 DIAGNOSIS — N3289 Other specified disorders of bladder: Secondary | ICD-10-CM | POA: Diagnosis not present

## 2024-05-05 LAB — URINALYSIS, ROUTINE W REFLEX MICROSCOPIC
Bilirubin, UA: NEGATIVE
Glucose, UA: NEGATIVE
Ketones, UA: NEGATIVE
Leukocytes,UA: NEGATIVE
Nitrite, UA: NEGATIVE
Protein,UA: NEGATIVE
RBC, UA: NEGATIVE
Specific Gravity, UA: <1.005 — ABNORMAL LOW (ref 1.005–1.030)
Urobilinogen, Ur: 0.2 mg/dL (ref 0.2–1.0)
pH, UA: 7 (ref 5.0–7.5)

## 2024-05-05 NOTE — Progress Notes (Signed)
 "  05/05/2024 12:55 PM   Luis Donovan 10-26-1945 [id number]  Referring provider: Sherrilee Belvie CROME, MD [address]  [address] [place],  [place] [address]  Followup prostate cancer   HPI: Luis Donovan is a 77yo here for followup for prostate cancer and hydronephrosis. Renal US  12/5 shows no hydronephrosis. He denies any flank pain. No worsening LUTS. IPSS 16 QOL 3 on no BPH therapy. He remains on eligard and Nubeqa.Mild hot flashes   PMH: Past Medical History:  Diagnosis Date   Anemia    CVA (cerebral vascular accident) (HCC) 07/17/2013   Empyema (HCC)    Erectile dysfunction due to arterial insufficiency    Essential (primary) hypertension    Gastro-esophageal reflux disease with esophagitis    GERD (gastroesophageal reflux disease)    Head injury    Heart disease    Hyperlipidemia, unspecified    Hypertension    Obesity, unspecified    Pneumonia    Post-traumatic stress disorder, unspecified    Prostate cancer (HCC)    Sleep apnea, unspecified     Surgical History: Past Surgical History:  Procedure Laterality Date   CARDIAC DEFIBRILLATOR PLACEMENT     COLONOSCOPY N/A 12/10/2023   Procedure: COLONOSCOPY;  Surgeon: Cindie Carlin POUR, DO;  Location: [facility];  Service: Endoscopy;  Laterality: N/A;  9:45 am, asa 3   CYSTOSCOPY WITH BIOPSY N/A 03/11/2023   Procedure: CYSTOSCOPY WITH BIOPSY;  Surgeon: Sherrilee Belvie CROME, MD;  Location: [facility];  Service: Urology;  Laterality: N/A;   CYSTOSCOPY WITH STENT PLACEMENT Right 03/11/2023   Procedure: CYSTOSCOPY WITH STENT PLACEMENT;  Surgeon: Sherrilee Belvie CROME, MD;  Location: [facility];  Service: Urology;  Laterality: Right;   ESOPHAGOGASTRODUODENOSCOPY N/A 12/10/2023   Procedure: EGD (ESOPHAGOGASTRODUODENOSCOPY);  Surgeon: Cindie Carlin POUR, DO;  Location: [facility];  Service: Endoscopy;  Laterality: N/A;   LUNG SURGERY     VATS   operation on skin of neck     cyst removal   ROTATOR CUFF REPAIR Left    TENDON  REPAIR     TRANSURETHRAL RESECTION OF BLADDER TUMOR N/A 03/11/2023   Procedure: TRANSURETHRAL RESECTION OF BLADDER TUMOR (TURBT);  Surgeon: Sherrilee Belvie CROME, MD;  Location: [facility];  Service: Urology;  Laterality: N/A;   VIDEO ASSISTED THORACOSCOPY      Home Medications:  Allergies as of 05/05/2024       Reactions   Docetaxel Shortness Of Breath        Medication List        Accurate as of May 05, 2024 12:55 PM. If you have any questions, ask your nurse or doctor.          bisacodyl  5 MG EC tablet Commonly known as: DULCOLAX Take 10 mg by mouth daily as needed for moderate constipation.   Carboxymethylcellulose Sod PF 0.5 % Soln Place 1 drop into both eyes 2 (two) times daily. For dry eyes   clotrimazole 1 % cream Commonly known as: LOTRIMIN Apply 1 Application topically 2 (two) times daily. Apply a small amount to affected area twice daily for feet.   cyanocobalamin  500 MCG tablet Commonly known as: VITAMIN B12 Take 1 tablet by mouth daily.   darolutamide 300 MG tablet Commonly known as: NUBEQA Take 600 mg by mouth 2 (two) times daily with a meal.   diclofenac Sodium 1 % Gel Commonly known as: VOLTAREN Apply 2 g topically 4 (four) times daily as needed (pain).   Eliquis  5 MG Tabs tablet  Generic drug: apixaban  Take 5 mg by mouth 2 (two) times daily.   feeding supplement Liqd Take 237 mLs by mouth 2 (two) times daily between meals.   FLUoxetine  40 MG capsule Commonly known as: PROZAC  Take 40 mg by mouth daily.   fluticasone  50 MCG/ACT nasal spray Commonly known as: FLONASE  Place 2 sprays into both nostrils daily.   gabapentin 100 MG capsule Commonly known as: NEURONTIN Take 1 capsule by mouth at bedtime.   KETOTIFEN  FUMARATE OP Apply 1 drop to eye 2 (two) times daily as needed (allergies). 0.025%   latanoprost  0.005 % ophthalmic solution Commonly known as: XALATAN  Place 1 drop into both eyes at bedtime.   metoprolol  succinate 50 MG 24 hr  tablet Commonly known as: TOPROL -XL Take 0.5 tablets by mouth daily.   mirtazapine  15 MG tablet Commonly known as: REMERON  Take 0.5 tablets (7.5 mg total) by mouth at bedtime. For loss of appetite   ondansetron  8 MG tablet Commonly known as: ZOFRAN  Take 1 tablet (8 mg total) by mouth every 8 (eight) hours as needed for nausea or vomiting.   Oxycodone  HCl 10 MG Tabs Take 10 mg by mouth every 4 (four) hours as needed (severe pain).   Pacerone  200 MG tablet Generic drug: amiodarone  Take 200 mg by mouth daily.   pantoprazole  40 MG tablet Commonly known as: PROTONIX  Take 1 tablet (40 mg total) by mouth 2 (two) times daily.   polyethylene glycol 17 g packet Commonly known as: MIRALAX  / GLYCOLAX  Take 17 g by mouth 2 (two) times daily.   potassium chloride  10 MEQ tablet Commonly known as: KLOR-CON  M Take 1 tablet (10 mEq total) by mouth every other day. What changed: when to take this   rosuvastatin  10 MG tablet Commonly known as: CRESTOR  Take 0.5 tablets by mouth daily.   torsemide  20 MG tablet Commonly known as: DEMADEX  Take by mouth. Takes 20 mg in morning and 40 mg in evenings.   traZODone  50 MG tablet Commonly known as: DESYREL  Take 50 mg by mouth at bedtime.   vitamin D3 50 MCG (2000 UT) Caps Take 2,000 Units by mouth daily.   Xtampza  ER 9 MG C12a Generic drug: oxyCODONE  ER Take 9 mg by mouth every 12 (twelve) hours.        Allergies: Allergies[1]  Family History: No family history on file.  Social History:  reports that he has quit smoking. He has never used smokeless tobacco. He reports that he does not drink alcohol  and does not use drugs.  ROS: All other review of systems were reviewed and are negative except what is noted above in HPI  Physical Exam: BP 135/64   Pulse (!) 58   Constitutional:  Alert and oriented, No acute distress. HEENT:  AT, moist mucus membranes.  Trachea midline, no masses. Cardiovascular: No clubbing, cyanosis, or  edema. Respiratory: Normal respiratory effort, no increased work of breathing. GI: Abdomen is soft, nontender, nondistended, no abdominal masses GU: No CVA tenderness.  Lymph: No cervical or inguinal lymphadenopathy. Skin: No rashes, bruises or suspicious lesions. Neurologic: Grossly intact, no focal deficits, moving all 4 extremities. Psychiatric: Normal mood and affect.  Laboratory Data: Lab Results  Component Value Date   WBC 9.0 10/05/2023   HGB 8.4 (L) 10/05/2023   HCT 25.7 (L) 10/05/2023   MCV 97.3 10/05/2023   PLT 220 10/05/2023    Lab Results  Component Value Date   CREATININE 2.46 (H) 10/05/2023    No results found for: PSA  No results found for: TESTOSTERONE  Lab Results  Component Value Date   HGBA1C (H) 09/18/2010    6.2 (NOTE)                                                                       According to the ADA Clinical Practice Recommendations for 2011, when HbA1c is used as a screening test:   >=6.5%   Diagnostic of Diabetes Mellitus           (if abnormal result  is confirmed)  5.7-6.4%   Increased risk of developing Diabetes Mellitus  References:Diagnosis and Classification of Diabetes Mellitus,Diabetes Care,2011,34(Suppl 1):S62-S69 and Standards of Medical Care in         Diabetes - 2011,Diabetes Care,2011,34  (Suppl 1):S11-S61.    Urinalysis    Component Value Date/Time   COLORURINE AMBER (A) 01/18/2023 0211   APPEARANCEUR Clear 11/03/2023 1358   LABSPEC 1.018 01/18/2023 0211   PHURINE 5.0 01/18/2023 0211   GLUCOSEU Negative 11/03/2023 1358   HGBUR NEGATIVE 01/18/2023 0211   BILIRUBINUR Negative 11/03/2023 1358   KETONESUR NEGATIVE 01/18/2023 0211   PROTEINUR Negative 11/03/2023 1358   PROTEINUR NEGATIVE 01/18/2023 0211   UROBILINOGEN 1.0 09/18/2010 1918   NITRITE Negative 11/03/2023 1358   NITRITE NEGATIVE 01/18/2023 0211   LEUKOCYTESUR Negative 11/03/2023 1358   LEUKOCYTESUR NEGATIVE 01/18/2023 0211    Lab Results  Component  Value Date   LABMICR See below: 11/03/2023   WBCUA None seen 11/03/2023   LABEPIT 0-10 11/03/2023   BACTERIA None seen 11/03/2023    Pertinent Imaging: Renal US  04/21/2024: Images reviewed and discussed with the patient  No results found for this or any previous visit.  Results for orders placed during the hospital encounter of 01/17/23  US  Venous Img Lower Bilateral (DVT)  Narrative CLINICAL DATA:  Dyspnea, short of breath, lower extremity edema  EXAM: BILATERAL LOWER EXTREMITY VENOUS DOPPLER ULTRASOUND  TECHNIQUE: Gray-scale sonography with graded compression, as well as color Doppler and duplex ultrasound were performed to evaluate the lower extremity deep venous systems from the level of the common femoral vein and including the common femoral, femoral, profunda femoral, popliteal and calf veins including the posterior tibial, peroneal and gastrocnemius veins when visible. The superficial great saphenous vein was also interrogated. Spectral Doppler was utilized to evaluate flow at rest and with distal augmentation maneuvers in the common femoral, femoral and popliteal veins.  COMPARISON:  None Available.  FINDINGS: RIGHT LOWER EXTREMITY  Common Femoral Vein: No evidence of thrombus. Normal compressibility, respiratory phasicity and response to augmentation.  Saphenofemoral Junction: No evidence of thrombus. Normal compressibility and flow on color Doppler imaging.  Profunda Femoral Vein: No evidence of thrombus. Normal compressibility and flow on color Doppler imaging.  Femoral Vein: No evidence of thrombus. Normal compressibility, respiratory phasicity and response to augmentation.  Popliteal Vein: No evidence of thrombus. Normal compressibility, respiratory phasicity and response to augmentation.  Calf Veins: No evidence of thrombus. Normal compressibility and flow on color Doppler imaging.  Superficial Great Saphenous Vein: No evidence of thrombus.  Normal compressibility.  Venous Reflux:  None.  Other Findings:  None.  LEFT LOWER EXTREMITY  Common Femoral Vein: No evidence of thrombus. Normal compressibility, respiratory phasicity and response to augmentation.  Saphenofemoral Junction: No evidence of thrombus. Normal compressibility and flow on color Doppler imaging.  Profunda Femoral Vein: No evidence of thrombus. Normal compressibility and flow on color Doppler imaging.  Femoral Vein: No evidence of thrombus. Normal compressibility, respiratory phasicity and response to augmentation.  Popliteal Vein: No evidence of thrombus. Normal compressibility, respiratory phasicity and response to augmentation.  Calf Veins: No evidence of thrombus. Normal compressibility and flow on color Doppler imaging.  Superficial Great Saphenous Vein: No evidence of thrombus. Normal compressibility.  Venous Reflux:  None.  Other Findings:  None.  IMPRESSION: No evidence of deep venous thrombosis in either lower extremity.   Electronically Signed By: Wilkie Lent M.D. On: 01/20/2023 13:29  No results found for this or any previous visit.  No results found for this or any previous visit.  Results for orders placed during the hospital encounter of 04/21/24  US  RENAL  Narrative CLINICAL DATA:  Hydronephrosis  EXAM: RENAL / URINARY TRACT ULTRASOUND COMPLETE  COMPARISON:  Ultrasound 10/06/2023, PET CT 02/05/2019 for  FINDINGS: Right Kidney:  Renal measurements: 10.3 x 6.2 x 5.8 cm = volume: 194 mL. Echogenicity within normal limits. No mass or hydronephrosis visualized.  Left Kidney:  Renal measurements: 9.9 x 6.2 x 5.2 cm = volume: 167 mL. Echogenicity within normal limits. No mass or hydronephrosis visualized.  Bladder:  Appears normal for degree of bladder distention.  Other:  None.  IMPRESSION: Negative renal ultrasound.  No hydronephrosis   Electronically Signed By: Luke Bun M.D. On:  04/27/2024 21:41  No results found for this or any previous visit.  No results found for this or any previous visit.  No results found for this or any previous visit.   Assessment & Plan:    1. Bladder mass (Primary) -resolved - Urinalysis, Routine w reflex microscopic; Future - Urinalysis, Routine w reflex microscopic  2. Prostate cancer (HCC) Followup 3months with PSA  3. Hydronephrosis, unspecified hydronephrosis type resolved   No follow-ups on file.  Belvie Clara, MD  [facility] [place]       [1]  Allergies Allergen Reactions   Docetaxel Shortness Of Breath   "

## 2024-05-08 NOTE — Progress Notes (Signed)
 PROCEDURE PERFORMED:  Therapeutic left intraarticular hip joint injection.  REASON FOR PROCEDURES:  Left hip joint pain.  SURGEON:   Debby Kansky, M.D.  PROCEDURE:  After obtaining informed consent, the patient was placed in the supine position on the fluoroscopy table.  Blood pressure, pulse, and pulse oximetry was assessed pre and post-procedure. The hip was prepped and draped in a sterile fashion with Chloraprep and sterile towels.  The femoral artery was identified with digital pressure.   A #25 gauge 3.5 inch spinal needle was placed under fluoroscopic control at 60 degrees from the skin surface oblique position to rest just distal to the inferior border of the femoral head near its interdigitation with the femoral neck.  Approximately 4 to 5 mL of serosanguineous joint fluid was aspirated upon entering the joint. Approximately 1-2 mL of 200 mg/mL Omnipaque  contrast dye was injected, demonstrating intraarticular spread.  A total of 3 mL of 2% preservative free lidocaine  and 1.0 mL of 10 mg per mL dexamethasone  was instilled into the left hip joint.    The patient tolerated the procedure well and was transitioned to the post procedure holding area uneventfully.  The patient was monitored for adverse allergic, paralytic and hypertensive reactions.  The patient was discharged without complication. Key fluoroscopic images of the procedure were saved to a local server.

## 2024-05-22 ENCOUNTER — Encounter: Payer: Self-pay | Admitting: Urology

## 2024-05-22 NOTE — Patient Instructions (Signed)
 Hydronephrosis  Hydronephrosis is the swelling of one or both kidneys due to a blockage that stops urine from flowing out of the body. Kidneys filter waste from the blood and produce urine. This condition can lead to kidney failure and may become life-threatening if not treated promptly. What are the causes? In infants and children, common causes include problems that occur when a baby is developing in the womb. These can include problems in the kidneys or in the tubes that drain urine into the bladder (ureters). In adults, common causes include: Kidney stones. Pregnancy. A tumor or cyst in the abdomen or pelvis. An enlarged prostate gland. Other causes include: Bladder infection. Scar tissue from a previous surgery or injury. A blood clot. Cancer of the prostate, bladder, uterus, ovary, or colon. What are the signs or symptoms? Symptoms of this condition include: Pain or discomfort in your side (flank) or abdomen. Swelling in your abdomen. Nausea and vomiting. Fever. Pain when passing urine. Feelings of urgency when you need to urinate. Urinating more often than normal. In some cases, you may not have any symptoms. How is this diagnosed? This condition may be diagnosed based on: Your symptoms and medical history. A physical exam. Blood and urine tests. Imaging tests, such as an ultrasound, CT scan, or MRI. A procedure to look at your urinary tract and bladder by inserting a scope into the urethra (cystoscopy). How is this treated? Treatment for this condition depends on where the blockage is, how long it has been there, and what caused it. The goal of treatment is to remove the blockage. Treatment may include: Antibiotic medicines to treat or prevent infection. A procedure to place a small, thin tube (stent) into a blocked ureter. The stent will keep the ureter open so that urine can drain through it. A nonsurgical procedure that crushes kidney stones with shock waves  (extracorporeal shock wave lithotripsy). If kidney failure occurs, treatment may include dialysis or a kidney transplant. Follow these instructions at home:  Take over-the-counter and prescription medicines only as told by your health care provider. If you were prescribed an antibiotic medicine, take it exactly as told by your health care provider. Do not stop taking the antibiotic even if you start to feel better. Rest and return to your normal activities as told by your health care provider. Ask your health care provider what activities are safe for you. Drink enough fluid to keep your urine pale yellow. Keep all follow-up visits. This is important. Contact a health care provider if: You continue to have symptoms after treatment. You develop new symptoms. Your urine becomes cloudy or bloody. You have a fever. Get help right away if: You have severe flank or abdominal pain. You cannot drink fluids without vomiting. Summary Hydronephrosis is the swelling of one or both kidneys due to a blockage that stops urine from flowing out of the body. Hydronephrosis can lead to kidney failure and may become life-threatening if not treated promptly. The goal of treatment is to remove the blockage. It may include a procedure to insert a stent into a blocked ureter, a procedure to break up kidney stones, or taking antibiotic medicines. Follow your health care provider's instructions for taking care of yourself at home, including instructions about drinking fluids, taking medicines, and limiting activities. This information is not intended to replace advice given to you by your health care provider. Make sure you discuss any questions you have with your health care provider. Document Revised: 02/02/2023 Document Reviewed: 02/02/2023 Elsevier  Patient Education  2024 [employer].

## 2024-11-03 ENCOUNTER — Other Ambulatory Visit (HOSPITAL_COMMUNITY)

## 2024-11-24 ENCOUNTER — Ambulatory Visit: Admitting: Urology
